# Patient Record
Sex: Female | Born: 1935
Health system: Southern US, Community
[De-identification: ages and names within clinical notes are randomized; demographics above are authoritative.]

## PROBLEM LIST (undated history)

## (undated) DIAGNOSIS — Z7901 Long term (current) use of anticoagulants: Secondary | ICD-10-CM

## (undated) DIAGNOSIS — M1990 Unspecified osteoarthritis, unspecified site: Secondary | ICD-10-CM

## (undated) DIAGNOSIS — Z8719 Personal history of other diseases of the digestive system: Secondary | ICD-10-CM

## (undated) DIAGNOSIS — Z973 Presence of spectacles and contact lenses: Secondary | ICD-10-CM

## (undated) DIAGNOSIS — R112 Nausea with vomiting, unspecified: Secondary | ICD-10-CM

## (undated) DIAGNOSIS — Z972 Presence of dental prosthetic device (complete) (partial): Secondary | ICD-10-CM

## (undated) DIAGNOSIS — Z9889 Other specified postprocedural states: Secondary | ICD-10-CM

## (undated) DIAGNOSIS — I4819 Other persistent atrial fibrillation: Secondary | ICD-10-CM

## (undated) DIAGNOSIS — D509 Iron deficiency anemia, unspecified: Secondary | ICD-10-CM

## (undated) DIAGNOSIS — R21 Rash and other nonspecific skin eruption: Secondary | ICD-10-CM

## (undated) DIAGNOSIS — N200 Calculus of kidney: Secondary | ICD-10-CM

## (undated) DIAGNOSIS — C189 Malignant neoplasm of colon, unspecified: Secondary | ICD-10-CM

## (undated) DIAGNOSIS — H9190 Unspecified hearing loss, unspecified ear: Secondary | ICD-10-CM

## (undated) DIAGNOSIS — K6389 Other specified diseases of intestine: Secondary | ICD-10-CM

## (undated) DIAGNOSIS — I10 Essential (primary) hypertension: Secondary | ICD-10-CM

## (undated) DIAGNOSIS — I480 Paroxysmal atrial fibrillation: Secondary | ICD-10-CM

## (undated) HISTORY — PX: CATARACT EXTRACTION W/ INTRAOCULAR LENS  IMPLANT, BILATERAL: SHX1307

## (undated) HISTORY — PX: COLONOSCOPY: SHX174

## (undated) HISTORY — DX: Essential (primary) hypertension: I10

## (undated) HISTORY — PX: BREAST CYST EXCISION: SHX579

## (undated) HISTORY — DX: Other persistent atrial fibrillation: I48.19

## (undated) HISTORY — DX: Malignant neoplasm of colon, unspecified: C18.9

## (undated) HISTORY — DX: Paroxysmal atrial fibrillation: I48.0

---

## 1976-12-20 HISTORY — PX: ABDOMINAL HYSTERECTOMY: SHX81

## 2013-11-07 ENCOUNTER — Other Ambulatory Visit: Payer: Self-pay | Admitting: Cardiology

## 2013-11-12 ENCOUNTER — Other Ambulatory Visit: Payer: Self-pay | Admitting: General Surgery

## 2013-11-12 MED ORDER — CLOPIDOGREL BISULFATE 75 MG PO TABS: 75.0000 mg | ORAL_TABLET | Freq: Once | ORAL | Status: DC

## 2013-11-12 NOTE — Telephone Encounter (Signed)
rx re sent for pt

## 2013-11-23 ENCOUNTER — Other Ambulatory Visit: Payer: Self-pay | Admitting: *Deleted

## 2013-11-23 MED ORDER — CLOPIDOGREL BISULFATE 75 MG PO TABS: 75.0000 mg | ORAL_TABLET | Freq: Once | ORAL | Status: DC

## 2014-02-19 ENCOUNTER — Encounter: Payer: Self-pay | Admitting: General Surgery

## 2014-02-19 DIAGNOSIS — I4891 Unspecified atrial fibrillation: Secondary | ICD-10-CM

## 2014-02-19 DIAGNOSIS — I10 Essential (primary) hypertension: Secondary | ICD-10-CM | POA: Insufficient documentation

## 2014-02-19 DIAGNOSIS — E7800 Pure hypercholesterolemia, unspecified: Secondary | ICD-10-CM | POA: Insufficient documentation

## 2014-02-20 ENCOUNTER — Encounter: Payer: Self-pay | Admitting: Cardiology

## 2014-02-20 ENCOUNTER — Encounter (INDEPENDENT_AMBULATORY_CARE_PROVIDER_SITE_OTHER): Payer: Self-pay

## 2014-02-20 ENCOUNTER — Ambulatory Visit (INDEPENDENT_AMBULATORY_CARE_PROVIDER_SITE_OTHER): Payer: Medicare Other | Admitting: Cardiology

## 2014-02-20 VITALS — BP 150/76 | HR 107 | Ht 62.0 in | Wt 99.0 lb

## 2014-02-20 DIAGNOSIS — I498 Other specified cardiac arrhythmias: Secondary | ICD-10-CM

## 2014-02-20 DIAGNOSIS — R011 Cardiac murmur, unspecified: Secondary | ICD-10-CM

## 2014-02-20 DIAGNOSIS — I480 Paroxysmal atrial fibrillation: Secondary | ICD-10-CM | POA: Insufficient documentation

## 2014-02-20 DIAGNOSIS — I4891 Unspecified atrial fibrillation: Secondary | ICD-10-CM

## 2014-02-20 DIAGNOSIS — I10 Essential (primary) hypertension: Secondary | ICD-10-CM

## 2014-02-20 DIAGNOSIS — R000 Tachycardia, unspecified: Secondary | ICD-10-CM | POA: Insufficient documentation

## 2014-02-20 NOTE — Progress Notes (Signed)
  [address], [place], [place]  [address] Phone: [phone] Fax:  [phone]  Date:  02/20/2014   ID:  Kristen Cruz, DOB 07/31/1936, MRN [id number]  PCP:  No primary provider on file.  Cardiologist:  Fransico Him, MD     History of Present Illness: Kristen Cruz is a 78 y.o. female with a history of PAF and HTN presents today for followup.  She is doing well.  She denies any chest pain, SOB, DOE, LE edema, dizziness, palpitations or syncope.  She walks 3 miles daily.   Wt Readings from Last 3 Encounters:  02/20/14 99 lb (44.906 kg)     Past Medical History  Diagnosis Date  . PAF (paroxysmal atrial fibrillation)     patient has refused coumadin in the past.   . Hypertension     Current Outpatient Prescriptions  Medication Sig Dispense Refill  . clopidogrel (PLAVIX) 75 MG tablet Take 1 tablet (75 mg total) by mouth once.  90 tablet  1  . diltiazem (DILACOR XR) 180 MG 24 hr capsule Take 180 mg by mouth daily.      . fluorometholone (FML) 0.1 % ophthalmic suspension       . Multiple Vitamin (MULTIVITAMIN) tablet Take 1 tablet by mouth daily.       No current facility-administered medications for this visit.    Allergies:    Allergies  Allergen Reactions  . Asa [Aspirin]   . Bentyl [Dicyclomine Hcl]   . Sucrets [Dyclonine]   . Sulfa Antibiotics   . Tetracycline Hcl   . Tylenol [Acetaminophen]     Social History:  The patient  reports that she has never smoked. She does not have any smokeless tobacco history on file. She reports that she does not drink alcohol or use illicit drugs.   Family History:  The patient's family history is not on file.   ROS:  Please see the history of present illness.      All other systems reviewed and negative.   PHYSICAL EXAM: VS:  BP 150/76  Pulse 107  Ht 5\' 2"  (1.575 m)  Wt 99 lb (44.906 kg)  BMI 18.10 kg/m2 Well nourished, well developed, in no acute distress HEENT: normal Neck: no JVD Cardiac:  normal  S1, S2; RRR; 2/6 SM at RUSB Lungs:  clear to auscultation bilaterally, no wheezing, rhonchi or rales Abd: soft, nontender, no hepatomegaly Ext: no edema Skin: warm and dry Neuro:  CNs 2-12 intact, no focal abnormalities noted  EKG: NSR with incomplete RBBB     ASSESSMENT AND PLAN:  1.  PAF maintaining NSR   - continue Diltiazem/Plavix 2.  HTN - slightly elevated but at home it runs around 115/80mmHg   - continue Diltiazem 3.  Incomplete RBBB 4.  Heart murmur   - 2D echo to evaluate 5.  Sinus tachycardia -  Her HR is mildly elevated today so I will get a 24 hour Holter to evaluate for average HR  Followup with me in 1 year  Signed, Fransico Him, MD 02/20/2014 9:46 AM

## 2014-02-20 NOTE — Patient Instructions (Signed)
Your physician recommends that you continue on your current medications as directed. Please refer to the Current Medication list given to you today.  Your physician has requested that you have an echocardiogram. Echocardiography is a painless test that uses sound waves to create images of your heart. It provides your doctor with information about the size and shape of your heart and how well your heart's chambers and valves are working. This procedure takes approximately one hour. There are no restrictions for this procedure.  Your physician has recommended that you wear a holter monitor. Holter monitors are medical devices that record the heart's electrical activity. Doctors most often use these monitors to diagnose arrhythmias. Arrhythmias are problems with the speed or rhythm of the heartbeat. The monitor is a small, portable device. You can wear one while you do your normal daily activities. This is usually used to diagnose what is causing palpitations/syncope (passing out).  Your physician wants you to follow-up in: 1 Year with Dr Mallie Snooks will receive a reminder letter in the mail two months in advance. If you don't receive a letter, please call our office to schedule the follow-up appointment.

## 2014-03-08 ENCOUNTER — Encounter (INDEPENDENT_AMBULATORY_CARE_PROVIDER_SITE_OTHER): Payer: Medicare Other

## 2014-03-08 ENCOUNTER — Encounter: Payer: Self-pay | Admitting: Radiology

## 2014-03-08 ENCOUNTER — Ambulatory Visit (HOSPITAL_COMMUNITY): Payer: Medicare Other | Attending: Cardiovascular Disease | Admitting: Radiology

## 2014-03-08 DIAGNOSIS — I498 Other specified cardiac arrhythmias: Secondary | ICD-10-CM

## 2014-03-08 DIAGNOSIS — R000 Tachycardia, unspecified: Secondary | ICD-10-CM

## 2014-03-08 DIAGNOSIS — R011 Cardiac murmur, unspecified: Secondary | ICD-10-CM

## 2014-03-08 NOTE — Progress Notes (Signed)
Echocardiogram performed.  

## 2014-03-08 NOTE — Progress Notes (Signed)
Patient ID: Kristen Cruz, female   DOB: March 30, 1936, 78 y.o.   MRN: [id number] Evo 24hr holter applied

## 2014-03-12 ENCOUNTER — Telehealth: Payer: Self-pay | Admitting: Cardiology

## 2014-03-12 NOTE — Telephone Encounter (Signed)
Please let patient know that heart monitor showed normal rhythm with occasional extra heart beats from the top and bottom of the heart which are benign.  Avg HR 68bpm.

## 2014-03-13 NOTE — Telephone Encounter (Signed)
Pt is aware.  

## 2014-04-02 ENCOUNTER — Other Ambulatory Visit: Payer: Self-pay | Admitting: *Deleted

## 2014-04-02 MED ORDER — DILTIAZEM HCL ER 180 MG PO CP24: 180.0000 mg | ORAL_CAPSULE | Freq: Every day | ORAL | Status: DC

## 2014-04-15 ENCOUNTER — Telehealth: Payer: Self-pay | Admitting: Cardiology

## 2014-04-15 NOTE — Telephone Encounter (Signed)
New message    patient calling from work     C/O Medication that she on having problem with it.  Diltiazem  180 mg . Heart skipping a beat .

## 2014-04-15 NOTE — Telephone Encounter (Signed)
To Dr Radford Pax to advise.

## 2014-04-16 NOTE — Telephone Encounter (Signed)
LVM for pt to return call

## 2014-04-16 NOTE — Telephone Encounter (Signed)
Pt stated that the Cardizem is with a different supplier now. She has had problems since she had started with the new supplier. Pt states she is feeling her heart skipping a beat. She was able to get the medication with the old [employer]. She is feeling better since switching back to the old pill. No complaints now. From now on she can only use the peach/purple pill, she will not take a green pill.   TO Dr Radford Pax as a Juluis Rainier

## 2014-04-16 NOTE — Telephone Encounter (Signed)
When did this start.  She has been on Cardizem for some time and not complained of problems with it

## 2014-04-29 ENCOUNTER — Other Ambulatory Visit: Payer: Self-pay | Admitting: Cardiology

## 2014-07-28 ENCOUNTER — Other Ambulatory Visit: Payer: Self-pay | Admitting: Cardiology

## 2014-12-17 ENCOUNTER — Encounter: Payer: Self-pay | Admitting: Cardiology

## 2015-01-02 ENCOUNTER — Other Ambulatory Visit: Payer: Self-pay | Admitting: Cardiology

## 2015-02-27 ENCOUNTER — Encounter: Payer: Self-pay | Admitting: Cardiology

## 2015-02-27 ENCOUNTER — Ambulatory Visit (INDEPENDENT_AMBULATORY_CARE_PROVIDER_SITE_OTHER): Payer: Medicare Other | Admitting: Cardiology

## 2015-02-27 VITALS — BP 144/78 | HR 107 | Ht 62.5 in | Wt 99.8 lb

## 2015-02-27 DIAGNOSIS — I480 Paroxysmal atrial fibrillation: Secondary | ICD-10-CM

## 2015-02-27 DIAGNOSIS — I450 Right fascicular block: Secondary | ICD-10-CM

## 2015-02-27 DIAGNOSIS — I4510 Unspecified right bundle-branch block: Secondary | ICD-10-CM

## 2015-02-27 DIAGNOSIS — I10 Essential (primary) hypertension: Secondary | ICD-10-CM

## 2015-02-27 NOTE — Patient Instructions (Signed)
Your physician recommends that you continue on your current medications as directed. Please refer to the Current Medication list given to you today.  Your physician wants you to follow-up in: 1 year with Dr. Turner. You will receive a reminder letter in the mail two months in advance. If you don't receive a letter, please call our office to schedule the follow-up appointment.  

## 2015-02-27 NOTE — Progress Notes (Signed)
Cardiology Office Note   Date:  02/27/2015   ID:  Kristen Cruz, DOB 10/17/1936, MRN [id number]  PCP:  No primary care provider on file.  Cardiologist:   Sueanne Margarita, MD   Chief Complaint  Patient presents with  . Atrial Fibrillation  . Hypertension      History of Present Illness: Kristen Cruz is a 79 y.o. female with a history of PAF and HTN presents today for followup. She is doing well. She denies any chest pain, SOB, DOE, LE edema, dizziness, palpitations or syncope. She walks 3 miles daily.    Past Medical History  Diagnosis Date  . PAF (paroxysmal atrial fibrillation)     patient has refused coumadin in the past.   . Hypertension     History reviewed. No pertinent past surgical history.   Current Outpatient Prescriptions  Medication Sig Dispense Refill  . clopidogrel (PLAVIX) 75 MG tablet TAKE 1 TABLET DAILY OR AS DIRECTED 90 tablet 0  . diltiazem (DILACOR XR) 180 MG 24 hr capsule Take 1 capsule (180 mg total) by mouth daily. 90 capsule 3  . fluorometholone (FML) 0.1 % ophthalmic suspension     . Multiple Vitamin (MULTIVITAMIN) tablet Take 1 tablet by mouth daily.     No current facility-administered medications for this visit.    Allergies:   Asa; Bentyl; Sucrets; Sulfa antibiotics; Tetracycline hcl; and Tylenol    Social History:  The patient  reports that she has never smoked. She does not have any smokeless tobacco history on file. She reports that she does not drink alcohol or use illicit drugs.   Family History:  The patient's family history includes Heart Problems in her mother; Hypertension in her mother.    ROS:  Please see the history of present illness.   Otherwise, review of systems are positive for none.   All other systems are reviewed and negative.    PHYSICAL EXAM: VS:  BP 144/78 mmHg  Pulse 107  Ht 5' 2.5" (1.588 m)  Wt 99 lb 12.8 oz (45.269 kg)  BMI 17.95 kg/m2 , BMI Body mass index is 17.95 kg/(m^2). GEN: Well  nourished, well developed, in no acute distress HEENT: normal Neck: no JVD, carotid bruits, or masses Cardiac: RRR; no murmurs, rubs, or gallops,no edema  Respiratory:  clear to auscultation bilaterally, normal work of breathing GI: soft, nontender, nondistended, + BS MS: no deformity or atrophy Skin: warm and dry, no rash Neuro:  Strength and sensation are intact Psych: euthymic mood, full affect   EKG:  EKG was ordered today showed NSR with biatrial enlargement and IRBBB    Recent Labs: No results found for requested labs within last 365 days.    Lipid Panel No results found for: CHOL, TRIG, HDL, CHOLHDL, VLDL, LDLCALC, LDLDIRECT    Wt Readings from Last 3 Encounters:  02/27/15 99 lb 12.8 oz (45.269 kg)  02/20/14 99 lb (44.906 kg)      ASSESSMENT AND PLAN:  1. PAF maintaining NSR - continue Diltiazem/Plavix 2. HTN - controlled - continue Diltiazem 3. Incomplete RBBB  Current medicines are reviewed at length with the patient today.  The patient does not have concerns regarding medicines.  The following changes have been made:  no change  Labs/ tests ordered today include: None  No orders of the defined types were placed in this encounter.     Disposition:   FU with me in 1 year   Signed, Sueanne Margarita, MD  02/27/2015 9:13  AM    [facility] [place], [place], [place]  [address] Phone: [phone]; Fax: [phone]

## 2015-04-02 ENCOUNTER — Other Ambulatory Visit: Payer: Self-pay | Admitting: Cardiology

## 2015-04-07 ENCOUNTER — Other Ambulatory Visit: Payer: Self-pay

## 2015-04-07 MED ORDER — DILTIAZEM HCL ER 180 MG PO CP24: 180.0000 mg | ORAL_CAPSULE | Freq: Every day | ORAL | Status: DC

## 2015-12-17 ENCOUNTER — Encounter: Payer: Self-pay | Admitting: *Deleted

## 2016-03-21 NOTE — Progress Notes (Signed)
Cardiology Office Note   Date:  03/22/2016   ID:  Kristen Cruz, DOB Sep 12, 1936, MRN [id number]  PCP:  No PCP Per Patient    Chief Complaint  Patient presents with  . Atrial Fibrillation    no sx  . Hypertension      History of Present Illness: Kristen Cruz is a 80 y.o. female with a history of PAF and HTN presents today for followup. She is doing fairly well.  She is just getting over a cold.   She denies any chest pain, SOB, DOE, LE edema, dizziness, palpitations or syncope. She walks 2 miles daily.    Past Medical History  Diagnosis Date  . PAF (paroxysmal atrial fibrillation) ([place])     patient has refused coumadin in the past.   . Hypertension     History reviewed. No pertinent past surgical history.   Current Outpatient Prescriptions  Medication Sig Dispense Refill  . clopidogrel (PLAVIX) 75 MG tablet Take 75 mg by mouth daily.    Marland Kitchen diltiazem (DILACOR XR) 180 MG 24 hr capsule Take 1 capsule (180 mg total) by mouth daily. 90 capsule 3  . fluorometholone (FML) 0.1 % ophthalmic suspension Place 1 drop into both eyes daily as needed (for dry eyes).     . Multiple Vitamin (MULTIVITAMIN) tablet Take 1 tablet by mouth daily.     No current facility-administered medications for this visit.    Allergies:   Asa; Bentyl; Sucrets; Sulfa antibiotics; Tetracycline hcl; and Tylenol    Social History:  The patient  reports that she has never smoked. She does not have any smokeless tobacco history on file. She reports that she does not drink alcohol or use illicit drugs.   Family History:  The patient's family history includes Heart Problems in her mother; Hypertension in her mother.    ROS:  Please see the history of present illness.   Otherwise, review of systems are positive for none.   All other systems are reviewed and negative.    PHYSICAL EXAM: VS:  BP 170/60 mmHg  Pulse 111  Ht 5' 2.5" (1.588 m)  Wt 101 lb (45.813 kg)  BMI 18.17  kg/m2  SpO2 96% , BMI Body mass index is 18.17 kg/(m^2). GEN: Well nourished, well developed, in no acute distress HEENT: normal Neck: no JVD, carotid bruits, or masses Cardiac: RRR; no murmurs, rubs, or gallops,no edema  Respiratory:  clear to auscultation bilaterally, normal work of breathing GI: soft, nontender, nondistended, + BS MS: no deformity or atrophy Skin: warm and dry, no rash Neuro:  Strength and sensation are intact Psych: euthymic mood, full affect   EKG:  EKG is ordered today. The ekg ordered today demonstrates sinus tachycardia at 109bpm with IRBBB   Recent Labs: No results found for requested labs within last 365 days.    Lipid Panel No results found for: CHOL, TRIG, HDL, CHOLHDL, VLDL, LDLCALC, LDLDIRECT    Wt Readings from Last 3 Encounters:  03/22/16 101 lb (45.813 kg)  02/27/15 99 lb 12.8 oz (45.269 kg)  02/20/14 99 lb (44.906 kg)      Other studies Reviewed:  ASSESSMENT AND PLAN:  1. PAF maintaining NSR.  Her CHADS2VASC score is 4.  She has refused coumadin in the past but I discussed the NOAC agents with her today and she is willing to go on Eiliquis.  I will start at 5mg   BID.  She will stop her Plavix.   - continue Diltiazem 2. HTN - poorly controlled - she says that she has white coat HTN.  Her BP at home this am was 137/74mmHg.   - continue Diltiazem 3. Incomplete RBBB 4.  Sinus tachycardia - she gets very anxious coming to the doctor.  At home this am it was 70bpm.    Current medicines are reviewed at length with the patient today.  The patient does not have concerns regarding medicines.  The following changes have been made:  no change  Labs/ tests ordered today: See above Assessment and Plan No orders of the defined types were placed in this encounter.     Disposition:   FU with me in 1 year  Signed, Sueanne Margarita, MD  03/22/2016 9:41 AM    [facility] [place],  [place], [place]  [address] Phone: [phone]; Fax: [phone]

## 2016-03-22 ENCOUNTER — Ambulatory Visit (INDEPENDENT_AMBULATORY_CARE_PROVIDER_SITE_OTHER): Payer: Medicare Other | Admitting: Cardiology

## 2016-03-22 ENCOUNTER — Encounter: Payer: Self-pay | Admitting: Cardiology

## 2016-03-22 VITALS — BP 170/60 | HR 111 | Ht 62.5 in | Wt 101.0 lb

## 2016-03-22 DIAGNOSIS — I450 Right fascicular block: Secondary | ICD-10-CM

## 2016-03-22 DIAGNOSIS — I10 Essential (primary) hypertension: Secondary | ICD-10-CM | POA: Diagnosis not present

## 2016-03-22 DIAGNOSIS — I480 Paroxysmal atrial fibrillation: Secondary | ICD-10-CM | POA: Diagnosis not present

## 2016-03-22 DIAGNOSIS — I4510 Unspecified right bundle-branch block: Secondary | ICD-10-CM

## 2016-03-22 DIAGNOSIS — R000 Tachycardia, unspecified: Secondary | ICD-10-CM

## 2016-03-22 LAB — CBC WITH DIFFERENTIAL/PLATELET
Basophils Absolute: 0 cells/uL (ref 0–200)
Basophils Relative: 0 %
Eosinophils Absolute: 0 cells/uL — ABNORMAL LOW (ref 15–500)
Eosinophils Relative: 0 %
HCT: 36.7 % (ref 35.0–45.0)
Hemoglobin: 11.9 g/dL (ref 11.7–15.5)
Lymphocytes Relative: 18 %
Lymphs Abs: 1080 cells/uL (ref 850–3900)
MCH: 28.6 pg (ref 27.0–33.0)
MCHC: 32.4 g/dL (ref 32.0–36.0)
MCV: 88.2 fL (ref 80.0–100.0)
MPV: 10.9 fL (ref 7.5–12.5)
Monocytes Absolute: 480 cells/uL (ref 200–950)
Monocytes Relative: 8 %
Neutro Abs: 4440 cells/uL (ref 1500–7800)
Neutrophils Relative %: 74 %
Platelets: 295 10*3/uL (ref 140–400)
RBC: 4.16 MIL/uL (ref 3.80–5.10)
RDW: 14.3 % (ref 11.0–15.0)
WBC: 6 10*3/uL (ref 3.8–10.8)

## 2016-03-22 LAB — BASIC METABOLIC PANEL
BUN: 12 mg/dL (ref 7–25)
CO2: 26 mmol/L (ref 20–31)
Calcium: 9 mg/dL (ref 8.6–10.4)
Chloride: 103 mmol/L (ref 98–110)
Creat: 0.67 mg/dL (ref 0.60–0.93)
Glucose, Bld: 113 mg/dL — ABNORMAL HIGH (ref 65–99)
Potassium: 4.1 mmol/L (ref 3.5–5.3)
Sodium: 139 mmol/L (ref 135–146)

## 2016-03-22 MED ORDER — APIXABAN 5 MG PO TABS: 5.0000 mg | ORAL_TABLET | Freq: Two times a day (BID) | ORAL | Status: DC

## 2016-03-22 NOTE — Patient Instructions (Signed)
Medication Instructions:  Your physician has recommended you make the following change in your medication:  1) STOP PLAVIX 2) START ELIQUIS 5 mg TWICE DAILY  Labwork: TODAY: BMET, CBC  Testing/Procedures: None  Follow-Up: Your physician wants you to follow-up in: 6 months with Dr. Radford Pax. You will receive a reminder letter in the mail two months in advance. If you don't receive a letter, please call our office to schedule the follow-up appointment.   Any Other Special Instructions Will Be Listed Below (If Applicable).     If you need a refill on your cardiac medications before your next appointment, please call your pharmacy.

## 2016-03-23 ENCOUNTER — Telehealth: Payer: Self-pay | Admitting: Cardiology

## 2016-03-23 NOTE — Telephone Encounter (Signed)
Informed patient of results and verbal understanding expressed.  

## 2016-03-23 NOTE — Telephone Encounter (Signed)
NEW MESSAGE   Pt is returning call for rn   Pt wants results for blood work

## 2016-03-23 NOTE — Telephone Encounter (Signed)
-----   Message from Sueanne Margarita, MD sent at 03/22/2016  4:08 PM EDT ----- Please let patient know that labs were normal.  Continue current medical therapy.

## 2016-03-25 ENCOUNTER — Telehealth: Payer: Self-pay | Admitting: Cardiology

## 2016-03-25 NOTE — Telephone Encounter (Signed)
Patient st her insurance has not approved her Eliquis.  To PA RN for appropriate paperwork.

## 2016-03-26 ENCOUNTER — Telehealth: Payer: Self-pay

## 2016-03-26 NOTE — Telephone Encounter (Signed)
Prior auth for Eliquis 5 mg approved by [facility] through 04.06/2017. Case ID# [id number]. Pharmacy notified.

## 2016-03-26 NOTE — Telephone Encounter (Signed)
Patient notified, Eliquis approved.

## 2016-03-29 ENCOUNTER — Other Ambulatory Visit: Payer: Self-pay

## 2016-03-29 MED ORDER — DILTIAZEM HCL ER 180 MG PO CP24: 180.0000 mg | ORAL_CAPSULE | Freq: Every day | ORAL | Status: DC

## 2016-09-14 ENCOUNTER — Encounter (INDEPENDENT_AMBULATORY_CARE_PROVIDER_SITE_OTHER): Payer: Self-pay

## 2016-09-14 ENCOUNTER — Ambulatory Visit (INDEPENDENT_AMBULATORY_CARE_PROVIDER_SITE_OTHER): Payer: Medicare Other | Admitting: Cardiology

## 2016-09-14 ENCOUNTER — Encounter: Payer: Self-pay | Admitting: Cardiology

## 2016-09-14 VITALS — BP 152/74 | HR 94 | Ht 62.5 in | Wt 100.8 lb

## 2016-09-14 DIAGNOSIS — I480 Paroxysmal atrial fibrillation: Secondary | ICD-10-CM

## 2016-09-14 DIAGNOSIS — I10 Essential (primary) hypertension: Secondary | ICD-10-CM

## 2016-09-14 LAB — BASIC METABOLIC PANEL
BUN: 14 mg/dL (ref 7–25)
CO2: 26 mmol/L (ref 20–31)
Calcium: 9 mg/dL (ref 8.6–10.4)
Chloride: 104 mmol/L (ref 98–110)
Creat: 0.78 mg/dL (ref 0.60–0.93)
Glucose, Bld: 124 mg/dL — ABNORMAL HIGH (ref 65–99)
Potassium: 4.3 mmol/L (ref 3.5–5.3)
Sodium: 138 mmol/L (ref 135–146)

## 2016-09-14 LAB — CBC WITH DIFFERENTIAL/PLATELET
Basophils Absolute: 65 cells/uL (ref 0–200)
Basophils Relative: 1 %
Eosinophils Absolute: 0 cells/uL — ABNORMAL LOW (ref 15–500)
Eosinophils Relative: 0 %
HCT: 33.8 % — ABNORMAL LOW (ref 35.0–45.0)
Hemoglobin: 11.2 g/dL — ABNORMAL LOW (ref 11.7–15.5)
Lymphocytes Relative: 22 %
Lymphs Abs: 1430 cells/uL (ref 850–3900)
MCH: 29.2 pg (ref 27.0–33.0)
MCHC: 33.1 g/dL (ref 32.0–36.0)
MCV: 88 fL (ref 80.0–100.0)
MPV: 10.7 fL (ref 7.5–12.5)
Monocytes Absolute: 390 cells/uL (ref 200–950)
Monocytes Relative: 6 %
Neutro Abs: 4615 cells/uL (ref 1500–7800)
Neutrophils Relative %: 71 %
Platelets: 308 10*3/uL (ref 140–400)
RBC: 3.84 MIL/uL (ref 3.80–5.10)
RDW: 13.7 % (ref 11.0–15.0)
WBC: 6.5 10*3/uL (ref 3.8–10.8)

## 2016-09-14 MED ORDER — DILTIAZEM HCL ER 180 MG PO CP24: 180.0000 mg | ORAL_CAPSULE | Freq: Every day | ORAL | 3 refills | Status: DC

## 2016-09-14 MED ORDER — APIXABAN 5 MG PO TABS: 5.0000 mg | ORAL_TABLET | Freq: Two times a day (BID) | ORAL | 11 refills | Status: DC

## 2016-09-14 NOTE — Patient Instructions (Signed)

## 2016-09-14 NOTE — Progress Notes (Signed)
Cardiology Office Note    Date:  09/14/2016   ID:  Kristen Cruz, DOB 1936/08/08, MRN [id number]  PCP:  No PCP Per Patient  Cardiologist:  Fransico Him, MD   Chief Complaint  Patient presents with  . Atrial Fibrillation  . Hypertension    History of Present Illness:  Kristen Cruz is a 80 y.o. female with a history of PAF and HTN presents today for followup. She is doing fairly well.   She denies any chest pain, SOB, DOE, LE edema, dizziness or syncope. She occasionally will have some palpitations but this is usually if she gets emotionally upset.  She continues to walks 2 miles daily.     Past Medical History:  Diagnosis Date  . Hypertension   . PAF (paroxysmal atrial fibrillation) (HCC)    CHADS2VASC score is 4 on Apixaban    History reviewed. No pertinent surgical history.  Current Medications: Outpatient Medications Prior to Visit  Medication Sig Dispense Refill  . apixaban (ELIQUIS) 5 MG TABS tablet Take 1 tablet (5 mg total) by mouth 2 (two) times daily. 60 tablet 11  . diltiazem (DILACOR XR) 180 MG 24 hr capsule Take 1 capsule (180 mg total) by mouth daily. 90 capsule 3  . Multiple Vitamin (MULTIVITAMIN) tablet Take 1 tablet by mouth daily.    . fluorometholone (FML) 0.1 % ophthalmic suspension Place 1 drop into both eyes daily as needed (for dry eyes).      No facility-administered medications prior to visit.      Allergies:   Asa [aspirin]; Bentyl [dicyclomine hcl]; Sucrets [dyclonine]; Sulfa antibiotics; Tetracycline hcl; and Tylenol [acetaminophen]   Social History   Social History  . Marital status: Married    Spouse name: N/A  . Number of children: N/A  . Years of education: N/A   Social History Main Topics  . Smoking status: Never Smoker  . Smokeless tobacco: Never Used  . Alcohol use No  . Drug use: No  . Sexual activity: Not Asked   Other Topics Concern  . None   Social History Narrative  . None     Family History:  The  patient's family history includes Heart Problems in her mother; Hypertension in her mother.   ROS:   Please see the history of present illness.    ROS All other systems reviewed and are negative.  No flowsheet data found.     PHYSICAL EXAM:   VS:  BP (!) 152/74   Pulse 94   Ht 5' 2.5" (1.588 m)   Wt 100 lb 12.8 oz (45.7 kg)   SpO2 97%   BMI 18.14 kg/m    GEN: Well nourished, well developed, in no acute distress  HEENT: normal  Neck: no JVD, carotid bruits, or masses Cardiac: RRR; no murmurs, rubs, or gallops,no edema.  Intact distal pulses bilaterally.  Respiratory:  clear to auscultation bilaterally, normal work of breathing GI: soft, nontender, nondistended, + BS MS: no deformity or atrophy  Skin: warm and dry, no rash Neuro:  Alert and Oriented x 3, Strength and sensation are intact Psych: euthymic mood, full affect  Wt Readings from Last 3 Encounters:  09/14/16 100 lb 12.8 oz (45.7 kg)  03/22/16 101 lb (45.8 kg)  02/27/15 99 lb 12.8 oz (45.3 kg)      Studies/Labs Reviewed:   EKG:  EKG is not ordered today.    Recent Labs: 03/22/2016: BUN 12; Creat 0.67; Hemoglobin 11.9; Platelets 295; Potassium 4.1; Sodium 139  Lipid Panel No results found for: CHOL, TRIG, HDL, CHOLHDL, VLDL, LDLCALC, LDLDIRECT  Additional studies/ records that were reviewed today include:  none    ASSESSMENT:    1. PAF (paroxysmal atrial fibrillation) ([place])   2. Essential hypertension, benign      PLAN:  In order of problems listed above:  1. PAF maintaining NSR.  Continue Apixiban and Cardizem. Check BMET and CBC. 2. HTN - Bp borderline controlled in office today.  She says at home it was 127/14mmHg this am.  She will continue on Cardizem.      Medication Adjustments/Labs and Tests Ordered: Current medicines are reviewed at length with the patient today.  Concerns regarding medicines are outlined above.  Medication changes, Labs and Tests ordered today are listed in the Patient  Instructions below.  There are no Patient Instructions on file for this visit.   Signed, Fransico Him, MD  09/14/2016 9:40 AM    [facility], [place], [place]  [address] Phone: [phone]; Fax: [phone]

## 2017-03-13 NOTE — Progress Notes (Signed)
Cardiology Office Note    Date:  03/14/2017   ID:  Kristen Cruz, DOB 1936-10-24, MRN [id number]  PCP:  No PCP Per Patient  Cardiologist:  Fransico Him, MD   Chief Complaint  Patient presents with  . Atrial Fibrillation  . Hypertension    History of Present Illness:  Kristen Cruz is a 81 y.o. female with a history of persistent atrial fibrillation and HTN, who presents today for followup. She has been doing well since I saw her last.She denies any chest pain, SOB, DOE, LE edema, PND, orthopnea, dizziness or syncope. She continues to walks 2 miles daily.   Past Medical History:  Diagnosis Date  . Hypertension   . Persistent atrial fibrillation (HCC)    CHADS2VASC score is 4 on Apixaban    No past surgical history on file.  Current Medications: Current Meds  Medication Sig  . apixaban (ELIQUIS) 5 MG TABS tablet Take 1 tablet (5 mg total) by mouth 2 (two) times daily.  Marland Kitchen diltiazem (DILACOR XR) 180 MG 24 hr capsule Take 1 capsule (180 mg total) by mouth daily.  . Multiple Vitamin (MULTIVITAMIN) tablet Take 1 tablet by mouth daily.  . Polyvinyl Alcohol-Povidone (REFRESH OP) Apply to eye as directed.    Allergies:   Asa [aspirin]; Bentyl [dicyclomine hcl]; Sucrets [dyclonine]; Sulfa antibiotics; Tetracycline hcl; and Tylenol [acetaminophen]   Social History   Social History  . Marital status: Married    Spouse name: N/A  . Number of children: N/A  . Years of education: N/A   Social History Main Topics  . Smoking status: Never Smoker  . Smokeless tobacco: Never Used  . Alcohol use No  . Drug use: No  . Sexual activity: Not Asked   Other Topics Concern  . None   Social History Narrative  . None     Family History:  The patient's  family history includes Heart Problems in her mother; Hypertension in her mother.   ROS:   Please see the history of present illness.    ROS All other systems reviewed and are negative.  No flowsheet data  found.     PHYSICAL EXAM:   VS:  BP (!) 160/80   Pulse 94   Ht 5\' 2"  (1.575 m)   Wt 100 lb (45.4 kg)   BMI 18.29 kg/m    GEN: Well nourished, well developed, in no acute distress  HEENT: normal  Neck: no JVD, carotid bruits, or masses Cardiac: RRR; no murmurs, rubs, or gallops,no edema.  Intact distal pulses bilaterally.  Respiratory:  clear to auscultation bilaterally, normal work of breathing GI: soft, nontender, nondistended, + BS MS: no deformity or atrophy  Skin: warm and dry, no rash Neuro:  Alert and Oriented x 3, Strength and sensation are intact Psych: euthymic mood, full affect  Wt Readings from Last 3 Encounters:  03/14/17 100 lb (45.4 kg)  09/14/16 100 lb 12.8 oz (45.7 kg)  03/22/16 101 lb (45.8 kg)      Studies/Labs Reviewed:   EKG:  EKG is not ordered today.    Recent Labs: 09/14/2016: BUN 14; Creat 0.78; Hemoglobin 11.2; Platelets 308; Potassium 4.3; Sodium 138   Lipid Panel No results found for: CHOL, TRIG, HDL, CHOLHDL, VLDL, LDLCALC, LDLDIRECT  Additional studies/ records that were reviewed today include:  none    ASSESSMENT:    1. Persistent atrial fibrillation ([place])   2. Essential hypertension, benign      PLAN:  In order of  problems listed above:  1. Persistent atrial fibrillation - she is maintaining NSR without any breakthrough.  She will continue on Cardizem and Apixaban. Her CADS2VASC score is 4.   I will check a BMET and CBC    2. HTN - BP borderline controlled today but at home her SBP is in the 100-110's.  She will continue on Cardizem.    Medication Adjustments/Labs and Tests Ordered: Current medicines are reviewed at length with the patient today.  Concerns regarding medicines are outlined above.  Medication changes, Labs and Tests ordered today are listed in the Patient Instructions below.  There are no Patient Instructions on file for this visit.   Signed, Fransico Him, MD  03/14/2017 9:20 AM    [facility] [place], [place], [place]  [address] Phone: [phone]; Fax: [phone]

## 2017-03-14 ENCOUNTER — Encounter: Payer: Self-pay | Admitting: Cardiology

## 2017-03-14 ENCOUNTER — Ambulatory Visit (INDEPENDENT_AMBULATORY_CARE_PROVIDER_SITE_OTHER): Payer: Medicare Other | Admitting: Cardiology

## 2017-03-14 VITALS — BP 160/80 | HR 94 | Ht 62.0 in | Wt 100.0 lb

## 2017-03-14 DIAGNOSIS — I481 Persistent atrial fibrillation: Secondary | ICD-10-CM | POA: Diagnosis not present

## 2017-03-14 DIAGNOSIS — I10 Essential (primary) hypertension: Secondary | ICD-10-CM

## 2017-03-14 DIAGNOSIS — I4819 Other persistent atrial fibrillation: Secondary | ICD-10-CM

## 2017-03-14 NOTE — Patient Instructions (Signed)
Medication Instructions:  Your physician recommends that you continue on your current medications as directed. Please refer to the Current Medication list given to you today.   Labwork: TODAY: BMET, CBC  Testing/Procedures: None  Follow-Up: Your physician wants you to follow-up in: 6 months with Dr. Theodosia Blender assistant. You will receive a reminder letter in the mail two months in advance. If you don't receive a letter, please call our office to schedule the follow-up appointment.   Your physician wants you to follow-up in: 1 year with Dr. Radford Pax. You will receive a reminder letter in the mail two months in advance. If you don't receive a letter, please call our office to schedule the follow-up appointment.   Any Other Special Instructions Will Be Listed Below (If Applicable).     If you need a refill on your cardiac medications before your next appointment, please call your pharmacy.

## 2017-03-15 ENCOUNTER — Telehealth: Payer: Self-pay

## 2017-03-15 LAB — CBC WITH DIFFERENTIAL/PLATELET
Basophils Absolute: 0 10*3/uL (ref 0.0–0.2)
Basos: 0 %
EOS (ABSOLUTE): 0 10*3/uL (ref 0.0–0.4)
Eos: 0 %
Hematocrit: 34.1 % (ref 34.0–46.6)
Hemoglobin: 10.9 g/dL — ABNORMAL LOW (ref 11.1–15.9)
Immature Grans (Abs): 0 10*3/uL (ref 0.0–0.1)
Immature Granulocytes: 0 %
Lymphocytes Absolute: 1 10*3/uL (ref 0.7–3.1)
Lymphs: 15 %
MCH: 27.7 pg (ref 26.6–33.0)
MCHC: 32 g/dL (ref 31.5–35.7)
MCV: 87 fL (ref 79–97)
Monocytes Absolute: 0.5 10*3/uL (ref 0.1–0.9)
Monocytes: 8 %
Neutrophils Absolute: 4.8 10*3/uL (ref 1.4–7.0)
Neutrophils: 77 %
Platelets: 315 10*3/uL (ref 150–379)
RBC: 3.94 x10E6/uL (ref 3.77–5.28)
RDW: 14.3 % (ref 12.3–15.4)
WBC: 6.3 10*3/uL (ref 3.4–10.8)

## 2017-03-15 LAB — BASIC METABOLIC PANEL
BUN/Creatinine Ratio: 17 (ref 12–28)
BUN: 12 mg/dL (ref 8–27)
CO2: 26 mmol/L (ref 18–29)
Calcium: 9 mg/dL (ref 8.7–10.3)
Chloride: 101 mmol/L (ref 96–106)
Creatinine, Ser: 0.7 mg/dL (ref 0.57–1.00)
GFR calc Af Amer: 96 mL/min/{1.73_m2} (ref >59)
GFR calc non Af Amer: 83 mL/min/{1.73_m2} (ref >59)
Glucose: 123 mg/dL — ABNORMAL HIGH (ref 65–99)
Potassium: 4.5 mmol/L (ref 3.5–5.2)
Sodium: 141 mmol/L (ref 134–144)

## 2017-03-15 NOTE — Telephone Encounter (Signed)
Prior auth for Eliquis 5 mg submitted to [facility].

## 2017-03-17 ENCOUNTER — Telehealth: Payer: Self-pay | Admitting: Cardiology

## 2017-03-17 NOTE — Telephone Encounter (Signed)
New Message     Please call pt calling re lab results

## 2017-03-17 NOTE — Telephone Encounter (Signed)
Lab results provided.  Copy mailed to her home per request.  Address verified.

## 2017-03-30 NOTE — Telephone Encounter (Signed)
We received a form back from [facility]. Form questions were answered and Dr Johnsie Cancel signed. Form faxed back to [facility].

## 2017-03-30 NOTE — Telephone Encounter (Signed)
Form has been placed in Dr Landis Gandy folder to be signed. Will fax to [facility] when ready.

## 2017-04-04 NOTE — Telephone Encounter (Signed)
**Note De-Identified Aubriella Perezgarcia Obfuscation** PA on Eliquis has been approved through [employer]. Approval good from 03/04/17 until 04/03/18.

## 2017-06-20 ENCOUNTER — Other Ambulatory Visit: Payer: Self-pay | Admitting: Cardiology

## 2017-08-10 ENCOUNTER — Telehealth: Payer: Self-pay | Admitting: Cardiology

## 2017-08-10 ENCOUNTER — Other Ambulatory Visit: Payer: Self-pay

## 2017-08-10 NOTE — Telephone Encounter (Signed)
Patient called to report she was notified by her pharmacy today her diltiazem is no longer being made by her manufacturer. She is worried about switching manufacturers because last time that happened (see 04/15/2014 phone note) her "heart starting going crazy jumping around."  She is used to taking pink/purple pills and is nervous to try the same medication from a different maker, since the color was the only difference last time. She has 3 pills left. She requests medication recommendations from Dr. Radford Pax.

## 2017-08-10 NOTE — Telephone Encounter (Signed)
Mrs.Hudock is calling because her Diltiazem she is taking is being discontinue and the pharmacy is supposed to call to let you know .  Please call

## 2017-08-10 NOTE — Telephone Encounter (Signed)
Follow up      Pt missed your call , please call

## 2017-08-10 NOTE — Telephone Encounter (Signed)
Please forward to Rutherford Limerick PharmD for recommendations

## 2017-08-10 NOTE — Telephone Encounter (Signed)
Left message to call back  

## 2017-08-11 NOTE — Telephone Encounter (Signed)
Spoke with patient's RPH, Verdis Frederickson. She confirmed the patient will only take [employer] brand diltiazem. It is unknown the brand that made her ill in 2015 because records don't go back that far, but the only green dilt at the store is [employer] brand. The only other brand of diltiazem 180 mg is Diltiazem XR and is orange. [employer] brand will only carry dilt 240 mg capsules.  Verdis Frederickson states if the patient is unwilling to try the orange capsules, her medication will have to be changed completely.   Called patient to discuss trying dilt XR (an orange medication).  Left message to call back.

## 2017-08-11 NOTE — Telephone Encounter (Signed)
Pt will need to switch to a different manufacturer if hers is no longer supplying diltiazem 180mg  extended release. Would advise pt to check with her retail pharmacy to see if they carry multiple other manufacturers so that she can avoid the previous alternative manufacturer she tried and had issues with.

## 2017-08-12 MED ORDER — DILTIAZEM HCL ER 180 MG PO CP24: 180.0000 mg | ORAL_CAPSULE | Freq: Every day | ORAL | 11 refills | Status: DC

## 2017-08-12 NOTE — Telephone Encounter (Signed)
Called patient back and told her that a prescription was sent in requesting the orange pill. Informed patient that a 30 day supply was sent in, just in case she had any issues with this pill color. Patient verbalized understanding.

## 2017-08-12 NOTE — Telephone Encounter (Signed)
Follow up ° ° ° ° ° °Returning a call to the nurse °

## 2017-09-07 NOTE — Progress Notes (Signed)
Cardiology Office Note    Date:  09/12/2017   ID:  Kristen Cruz, DOB 03/09/1936, MRN [id number]  PCP:  Patient, No Pcp Per  Cardiologist: Dr. Radford Pax  Chief Complaint  Patient presents with  . Follow-up    History of Present Illness:  Kristen Cruz is a 81 y.o. female with history of persistent atrial fibrillation,CHADSVASC=4 on Eliquis, hypertension. Last saw Dr. Radford Pax 02/2017 and doing well.  Patient comes in today for follow-up. She is under a lot of stress concern husband is undergoing chemotherapy for newly diagnosed cancer. She didn't take her medications before she came in her blood pressure and pulse are up a little. She says she always gets nervous before she comes. Her blood pressures run around 115/70 at home. She exercises daily and continues to work in Programmer, applications at [employer].    Past Medical History:  Diagnosis Date  . Hypertension   . Persistent atrial fibrillation (HCC)    CHADS2VASC score is 4 on Apixaban    History reviewed. No pertinent surgical history.  Current Medications: Current Meds  Medication Sig  . apixaban (ELIQUIS) 5 MG TABS tablet Take 1 tablet (5 mg total) by mouth 2 (two) times daily.  Marland Kitchen diltiazem (DILACOR XR) 180 MG 24 hr capsule Take 1 capsule (180 mg total) by mouth daily.  . Polyvinyl Alcohol-Povidone (REFRESH OP) Apply 2 drops to eye at bedtime.      Allergies:   Asa [aspirin]; Bentyl [dicyclomine hcl]; Sucrets [dyclonine]; Sulfa antibiotics; Tetracycline hcl; and Tylenol [acetaminophen]   Social History   Social History  . Marital status: Married    Spouse name: N/A  . Number of children: N/A  . Years of education: N/A   Social History Main Topics  . Smoking status: Never Smoker  . Smokeless tobacco: Never Used  . Alcohol use No  . Drug use: No  . Sexual activity: Not Asked   Other Topics Concern  . None   Social History Narrative  . None     Family History:  The patient's family history includes  Heart Problems in her mother; Hypertension in her mother.   ROS:   Please see the history of present illness.    Review of Systems  Constitution: Negative.  HENT: Negative.   Eyes: Negative.   Cardiovascular: Negative.   Respiratory: Negative.   Hematologic/Lymphatic: Negative.   Musculoskeletal: Negative.  Negative for joint pain.  Gastrointestinal: Negative.   Genitourinary: Negative.   Neurological: Negative.   Psychiatric/Behavioral: The patient is nervous/anxious.    All other systems reviewed and are negative.   PHYSICAL EXAM:   VS:  BP (!) 160/58   Pulse 92   Ht 5' 2.5" (1.588 m)   Wt 100 lb 6.4 oz (45.5 kg)   BMI 18.07 kg/m   Physical Exam  GEN: Well nourished, well developed, in no acute distress  Neck: no JVD, carotid bruits, or masses Cardiac:RRR; Midsystolic click at the apex  Respiratory:  clear to auscultation bilaterally, normal work of breathing GI: soft, nontender, nondistended, + BS Ext: without cyanosis, clubbing, or edema, Good distal pulses bilaterally Neuro:  Alert and Oriented x 3 Psych: euthymic mood, full affect  Wt Readings from Last 3 Encounters:  09/12/17 100 lb 6.4 oz (45.5 kg)  03/14/17 100 lb (45.4 kg)  09/14/16 100 lb 12.8 oz (45.7 kg)      Studies/Labs Reviewed:   EKG:  EKG is  ordered today.  The ekg ordered today demonstrates Normal  sinus rhythm  Recent Labs: 03/14/2017: BUN 12; Creatinine, Ser 0.70; Hemoglobin 10.9; Platelets 315; Potassium 4.5; Sodium 141   Lipid Panel No results found for: CHOL, TRIG, HDL, CHOLHDL, VLDL, LDLCALC, LDLDIRECT  Additional studies/ records that were reviewed today include:   2-D echo 2015 Study Conclusions  Left ventricle: The cavity size was normal. Systolic function was normal. The estimated ejection fraction was in the range of 55% to 65%. Wall motion was normal; there were no regional wall motion abnormalities. Transthoracic echocardiography.  M-mode, complete     ASSESSMENT:     1. Persistent atrial fibrillation ([place])   2. Essential hypertension, benign   3. Pure hypercholesterolemia      PLAN:  In order of problems listed above:  Persistent atrial fibrillation on diltiazem and Eliquis.In normal sinus rhythm today. Will check bmet today. Follow-up with Dr. Radford Pax in March.  Essential hypertension blood pressure elevated today but is always high when she comes in here. 115/70 at home.      Medication Adjustments/Labs and Tests Ordered: Current medicines are reviewed at length with the patient today.  Concerns regarding medicines are outlined above.  Medication changes, Labs and Tests ordered today are listed in the Patient Instructions below. There are no Patient Instructions on file for this visit.   Sumner Boast, PA-C  09/12/2017 9:07 AM    [facility] [address] [place], [place], [place]  [address] Phone: [phone]; Fax: [phone]

## 2017-09-12 ENCOUNTER — Encounter: Payer: Self-pay | Admitting: Physician Assistant

## 2017-09-12 ENCOUNTER — Ambulatory Visit (INDEPENDENT_AMBULATORY_CARE_PROVIDER_SITE_OTHER): Payer: Medicare Other | Admitting: Physician Assistant

## 2017-09-12 VITALS — BP 160/58 | HR 92 | Ht 62.5 in | Wt 100.4 lb

## 2017-09-12 DIAGNOSIS — I481 Persistent atrial fibrillation: Secondary | ICD-10-CM | POA: Diagnosis not present

## 2017-09-12 DIAGNOSIS — I10 Essential (primary) hypertension: Secondary | ICD-10-CM | POA: Diagnosis not present

## 2017-09-12 DIAGNOSIS — I4819 Other persistent atrial fibrillation: Secondary | ICD-10-CM

## 2017-09-12 LAB — BASIC METABOLIC PANEL
BUN/Creatinine Ratio: 17 (ref 12–28)
BUN: 12 mg/dL (ref 8–27)
CO2: 24 mmol/L (ref 20–29)
Calcium: 8.9 mg/dL (ref 8.7–10.3)
Chloride: 104 mmol/L (ref 96–106)
Creatinine, Ser: 0.7 mg/dL (ref 0.57–1.00)
GFR calc Af Amer: 95 mL/min/{1.73_m2} (ref >59)
GFR calc non Af Amer: 83 mL/min/{1.73_m2} (ref >59)
Glucose: 92 mg/dL (ref 65–99)
Potassium: 4.9 mmol/L (ref 3.5–5.2)
Sodium: 140 mmol/L (ref 134–144)

## 2017-09-12 NOTE — Patient Instructions (Signed)
Medication Instructions:  1. Your physician recommends that you continue on your current medications as directed. Please refer to the Current Medication list given to you today.   Labwork: TODAY BMET  Testing/Procedures: NONE ORDERED TODAY   Follow-Up: MARCH 2019 WITH DR. Radford Pax, WE WILL SEND OUT A REMINDER LETTER A FEW MONTHS EARLIER TO CALL AND MAKE AN APPT.Marland Kitchen  Any Other Special Instructions Will Be Listed Below (If Applicable).     If you need a refill on your cardiac medications before your next appointment, please call your pharmacy.

## 2017-09-13 ENCOUNTER — Telehealth: Payer: Self-pay

## 2017-09-13 NOTE — Telephone Encounter (Signed)
patient returned phone call message. spoke with patient about recent lab results. patient verbalized understanding.  patient requests results be mailed. labs have been mailed to patient.

## 2017-09-13 NOTE — Telephone Encounter (Signed)
-----   Message from Katrine Coho, RN sent at 09/13/2017  8:14 AM EDT ----- To CMA In Basket

## 2017-09-18 ENCOUNTER — Other Ambulatory Visit: Payer: Self-pay | Admitting: Cardiology

## 2018-03-19 NOTE — Progress Notes (Signed)
Cardiology Office Note:    Date:  03/20/2018   ID:  Kristen Cruz, DOB 11-19-1936, MRN [id number]  PCP:  Patient, No Pcp Per  Cardiologist:  No primary care provider on file.    Referring MD: No ref. provider found   Chief Complaint  Patient presents with  . Atrial Fibrillation  . Hypertension    History of Present Illness:    Kristen Cruz is a 82 y.o. female with a hx of persistent atrial fibrillation and HTN.  SHe is here today for followup and is doing well.  SHe denies any chest pain or pressure, SOB, DOE, PND, orthopnea, LE edema, dizziness, palpitations or syncope. She is compliant with her meds and is tolerating meds with no SE.    Past Medical History:  Diagnosis Date  . Hypertension   . Persistent atrial fibrillation (HCC)    CHADS2VASC score is 4 on Apixaban    History reviewed. No pertinent surgical history.  Current Medications: Current Meds  Medication Sig  . diltiazem (DILACOR XR) 180 MG 24 hr capsule Take 1 capsule (180 mg total) by mouth daily.  Marland Kitchen ELIQUIS 5 MG TABS tablet TAKE 1 TABLET BY MOUTH TWICE A DAY  . Polyvinyl Alcohol-Povidone (REFRESH OP) Apply 2 drops to eye at bedtime.      Allergies:   Asa [aspirin]; Bentyl [dicyclomine hcl]; Sucrets [dyclonine]; Sulfa antibiotics; Tetracycline hcl; and Tylenol [acetaminophen]   Social History   Socioeconomic History  . Marital status: Married    Spouse name: Not on file  . Number of children: Not on file  . Years of education: Not on file  . Highest education level: Not on file  Occupational History  . Not on file  Social Needs  . Financial resource strain: Not on file  . Food insecurity:    Worry: Not on file    Inability: Not on file  . Transportation needs:    Medical: Not on file    Non-medical: Not on file  Tobacco Use  . Smoking status: Never Smoker  . Smokeless tobacco: Never Used  Substance and Sexual Activity  . Alcohol use: No  . Drug use: No  . Sexual activity: Not on  file  Lifestyle  . Physical activity:    Days per week: Not on file    Minutes per session: Not on file  . Stress: Not on file  Relationships  . Social connections:    Talks on phone: Not on file    Gets together: Not on file    Attends religious service: Not on file    Active member of club or organization: Not on file    Attends meetings of clubs or organizations: Not on file    Relationship status: Not on file  Other Topics Concern  . Not on file  Social History Narrative  . Not on file     Family History: The patient's family history includes Heart Problems in her mother; Hypertension in her mother.  ROS:   Please see the history of present illness.    ROS  All other systems reviewed and negative.   EKGs/Labs/Other Studies Reviewed:    The following studies were reviewed today: none  EKG:  EKG is  ordered today and showed NSR at 87bpm with IRBBB and PACs and LAE Recent Labs: 09/12/2017: BUN 12; Creatinine, Ser 0.70; Potassium 4.9; Sodium 140   Recent Lipid Panel No results found for: CHOL, TRIG, HDL, CHOLHDL, VLDL, LDLCALC, LDLDIRECT  Physical Exam:  VS:  BP (!) 144/78   Pulse 87   Ht 5\' 2"  (1.575 m)   Wt 98 lb (44.5 kg)   SpO2 97%   BMI 17.92 kg/m     Wt Readings from Last 3 Encounters:  03/20/18 98 lb (44.5 kg)  09/12/17 100 lb 6.4 oz (45.5 kg)  03/14/17 100 lb (45.4 kg)     GEN:  Well nourished, well developed in no acute distress HEENT: Normal NECK: No JVD; No carotid bruits LYMPHATICS: No lymphadenopathy CARDIAC: RRR, no murmurs, rubs, gallops RESPIRATORY:  Clear to auscultation without rales, wheezing or rhonchi  ABDOMEN: Soft, non-tender, non-distended MUSCULOSKELETAL:  No edema; No deformity  SKIN: Warm and dry NEUROLOGIC:  Alert and oriented x 3 PSYCHIATRIC:  Normal affect   ASSESSMENT:    1. Persistent atrial fibrillation ([place])   2. Essential hypertension, benign   3. Pure hypercholesterolemia    PLAN:    In order of problems  listed above:  1.  PAF - she is maintaining NSR on exam today.  She will continue on Cardizem 180mg  daily and Eliquis 5mg  BID for a CHADS2VASC score of 4.  She denies any excessive bleeding problems on anticoagulation.   2.  HTN - BP well controlled on exam today.  She will continue on Cardizem.  3.  Hyperlipidemia followed by PCP.  Currently diet controlled.    Medication Adjustments/Labs and Tests Ordered: Current medicines are reviewed at length with the patient today.  Concerns regarding medicines are outlined above.  Orders Placed This Encounter  Procedures  . EKG 12-Lead   No orders of the defined types were placed in this encounter.   Signed, Fransico Him, MD  03/20/2018 8:47 AM    [place]

## 2018-03-20 ENCOUNTER — Ambulatory Visit (INDEPENDENT_AMBULATORY_CARE_PROVIDER_SITE_OTHER): Payer: Medicare Other | Admitting: Cardiology

## 2018-03-20 ENCOUNTER — Encounter: Payer: Self-pay | Admitting: Cardiology

## 2018-03-20 VITALS — BP 144/78 | HR 87 | Ht 62.0 in | Wt 98.0 lb

## 2018-03-20 DIAGNOSIS — I481 Persistent atrial fibrillation: Secondary | ICD-10-CM

## 2018-03-20 DIAGNOSIS — I4819 Other persistent atrial fibrillation: Secondary | ICD-10-CM

## 2018-03-20 DIAGNOSIS — E7800 Pure hypercholesterolemia, unspecified: Secondary | ICD-10-CM

## 2018-03-20 DIAGNOSIS — I10 Essential (primary) hypertension: Secondary | ICD-10-CM

## 2018-03-20 LAB — BASIC METABOLIC PANEL
BUN/Creatinine Ratio: 14 (ref 12–28)
BUN: 11 mg/dL (ref 8–27)
CO2: 23 mmol/L (ref 20–29)
Calcium: 8.8 mg/dL (ref 8.7–10.3)
Chloride: 103 mmol/L (ref 96–106)
Creatinine, Ser: 0.77 mg/dL (ref 0.57–1.00)
GFR calc Af Amer: 85 mL/min/{1.73_m2} (ref >59)
GFR calc non Af Amer: 74 mL/min/{1.73_m2} (ref >59)
Glucose: 94 mg/dL (ref 65–99)
Potassium: 4.1 mmol/L (ref 3.5–5.2)
Sodium: 139 mmol/L (ref 134–144)

## 2018-03-20 LAB — CBC
Hematocrit: 28.8 % — ABNORMAL LOW (ref 34.0–46.6)
Hemoglobin: 9.1 g/dL — ABNORMAL LOW (ref 11.1–15.9)
MCH: 24.5 pg — ABNORMAL LOW (ref 26.6–33.0)
MCHC: 31.6 g/dL (ref 31.5–35.7)
MCV: 78 fL — ABNORMAL LOW (ref 79–97)
Platelets: 344 10*3/uL (ref 150–379)
RBC: 3.71 x10E6/uL — ABNORMAL LOW (ref 3.77–5.28)
RDW: 15.1 % (ref 12.3–15.4)
WBC: 5.7 10*3/uL (ref 3.4–10.8)

## 2018-03-20 NOTE — Patient Instructions (Signed)
Medication Instructions:  Your physician recommends that you continue on your current medications as directed. Please refer to the Current Medication list given to you today.  If you need a refill on your cardiac medications, please contact your pharmacy first.  Labwork: Today for basic metabolic panel and complete blood count   Testing/Procedures: None ordered   Follow-Up: Your physician wants you to follow-up in: 6 months with Dr. Radford Pax. You will receive a reminder letter in the mail two months in advance. If you don't receive a letter, please call our office to schedule the follow-up appointment.  Any Other Special Instructions Will Be Listed Below (If Applicable).   Thank you for choosing [facility], RN  [phone]  If you need a refill on your cardiac medications before your next appointment, please call your pharmacy.

## 2018-03-22 ENCOUNTER — Telehealth: Payer: Self-pay

## 2018-03-22 NOTE — Telephone Encounter (Signed)
I have done an Eliquis PA through [employer]ymeds. 

## 2018-03-23 ENCOUNTER — Telehealth: Payer: Self-pay | Admitting: Cardiology

## 2018-03-23 ENCOUNTER — Other Ambulatory Visit: Payer: Self-pay | Admitting: Cardiology

## 2018-03-23 NOTE — Telephone Encounter (Signed)
Left message to call back regarding lab results.

## 2018-03-23 NOTE — Telephone Encounter (Signed)
New message ° ° °Patient calling for lab results. Please call °

## 2018-03-23 NOTE — Telephone Encounter (Signed)
Eliquis 5mg  refill request received; pt is 82 yrs old, wt-44.5kg, Crea-0.77 on 03/20/18, last seen by Dr. Radford Pax on 03/20/18; will end in refill to requested pharmacy.

## 2018-03-23 NOTE — Telephone Encounter (Signed)
Notes recorded by Teressa Senter, RN on 03/23/2018 at 11:59 AM EDT Patient made aware of lab results. I informed patient to follow up with PCP in regards to Hb levels. Patient stated she does not have PCP because she does not get sick. I offered patient [facility] information to establish care with a primary care. [facility]. I explained the importance of having a primary MD and follow up regarding Hb and being on an anticoagulate. Patient verbalized understanding and thankful for the call.

## 2018-03-29 ENCOUNTER — Other Ambulatory Visit: Payer: Self-pay | Admitting: Cardiology

## 2018-06-06 NOTE — Telephone Encounter (Signed)
Message received through [employer]ymeds: Approved on April 17  Case Id:[id number];Status:Approved;Review Type:Prior Auth;Coverage Start Date:02/20/2018;Coverage End Date:03/22/2019

## 2018-09-05 ENCOUNTER — Other Ambulatory Visit: Payer: Self-pay | Admitting: Cardiology

## 2018-09-21 NOTE — Progress Notes (Signed)
Cardiology Office Note:    Date:  09/22/2018   ID:  Kristen Cruz, DOB July 09, 1936, MRN [id number]  PCP:  Patient, No Pcp Per  Cardiologist:  Fransico Him, MD    Referring MD: No ref. provider found   Chief Complaint  Patient presents with  . Atrial Fibrillation  . Hypertension    History of Present Illness:    Kristen Cruz is a 82 y.o. female with a hx of persistent atrial fibrillationand HTN. She is here today for followup and is doing well.  She denies any chest pain or pressure, SOB, DOE, PND, orthopnea, LE edema, dizziness, palpitations or syncope. She is compliant with her meds and is tolerating meds with no SE.    Past Medical History:  Diagnosis Date  . Hypertension   . Persistent atrial fibrillation    CHADS2VASC score is 4 on Apixaban    No past surgical history on file.  Current Medications: Current Meds  Medication Sig  . DILT-XR 180 MG 24 hr capsule TAKE 1 CAPSULE BY MOUTH EVERY DAY  . ELIQUIS 5 MG TABS tablet TAKE 1 TABLET BY MOUTH TWICE A DAY  . Polyvinyl Alcohol-Povidone (REFRESH OP) Apply 2 drops to eye at bedtime.      Allergies:   Asa [aspirin]; Bentyl [dicyclomine hcl]; Sucrets [dyclonine]; Sulfa antibiotics; Tetracycline hcl; and Tylenol [acetaminophen]   Social History   Socioeconomic History  . Marital status: Married    Spouse name: Not on file  . Number of children: Not on file  . Years of education: Not on file  . Highest education level: Not on file  Occupational History  . Not on file  Social Needs  . Financial resource strain: Not on file  . Food insecurity:    Worry: Not on file    Inability: Not on file  . Transportation needs:    Medical: Not on file    Non-medical: Not on file  Tobacco Use  . Smoking status: Never Smoker  . Smokeless tobacco: Never Used  Substance and Sexual Activity  . Alcohol use: No  . Drug use: No  . Sexual activity: Not on file  Lifestyle  . Physical activity:    Days per week: Not on  file    Minutes per session: Not on file  . Stress: Not on file  Relationships  . Social connections:    Talks on phone: Not on file    Gets together: Not on file    Attends religious service: Not on file    Active member of club or organization: Not on file    Attends meetings of clubs or organizations: Not on file    Relationship status: Not on file  Other Topics Concern  . Not on file  Social History Narrative  . Not on file     Family History: The patient's family history includes Heart Problems in her mother; Hypertension in her mother.  ROS:   Please see the history of present illness.    ROS  All other systems reviewed and negative.   EKGs/Labs/Other Studies Reviewed:    The following studies were reviewed today: none  EKG:  EKG is not ordered today.    Recent Labs: 03/20/2018: BUN 11; Creatinine, Ser 0.77; Hemoglobin 9.1; Platelets 344; Potassium 4.1; Sodium 139   Recent Lipid Panel No results found for: CHOL, TRIG, HDL, CHOLHDL, VLDL, LDLCALC, LDLDIRECT  Physical Exam:    VS:  BP 136/74   Pulse 95   Ht  5\' 2"  (1.575 m)   Wt 99 lb (44.9 kg)   SpO2 96%   BMI 18.11 kg/m     Wt Readings from Last 3 Encounters:  09/22/18 99 lb (44.9 kg)  03/20/18 98 lb (44.5 kg)  09/12/17 100 lb 6.4 oz (45.5 kg)     GEN:  Well nourished, well developed in no acute distress HEENT: Normal NECK: No JVD; No carotid bruits LYMPHATICS: No lymphadenopathy CARDIAC: RRR, no murmurs, rubs, gallops RESPIRATORY:  Clear to auscultation without rales, wheezing or rhonchi  ABDOMEN: Soft, non-tender, non-distended MUSCULOSKELETAL:  No edema; No deformity  SKIN: Warm and dry NEUROLOGIC:  Alert and oriented x 3 PSYCHIATRIC:  Normal affect   ASSESSMENT:    1. Persistent atrial fibrillation ([place])   2. Essential hypertension, benign   3. Pure hypercholesterolemia    PLAN:    In order of problems listed above:  1.  Persistent atrial fibrillation - she is maintaining NSR on exam  today.  She will continue on Eliquis 5mg  BID and Diltiazem XR 180mg  daily.  Her creatinine was 0.77 and K+ 4.1 on 03/20/2018.  I will check a Hbg today.   2.  HTN - BP is controlled on exam today.  She will continue on Diltiazem XR 180mg  daily.    3.  Hyperlipidemia - diet controlled on followed by PCP.   Medication Adjustments/Labs and Tests Ordered: Current medicines are reviewed at length with the patient today.  Concerns regarding medicines are outlined above.  No orders of the defined types were placed in this encounter.  No orders of the defined types were placed in this encounter.   Signed, Fransico Him, MD  09/22/2018 8:53 AM    [place]

## 2018-09-22 ENCOUNTER — Observation Stay (HOSPITAL_COMMUNITY)
Admission: EM | Admit: 2018-09-22 | Discharge: 2018-09-23 | Disposition: A | Discharge status: Home / Self Care | Payer: Medicare Other | Attending: Internal Medicine | Admitting: Internal Medicine

## 2018-09-22 ENCOUNTER — Encounter (HOSPITAL_COMMUNITY): Payer: Self-pay | Admitting: Emergency Medicine

## 2018-09-22 ENCOUNTER — Encounter: Payer: Self-pay | Admitting: Cardiology

## 2018-09-22 ENCOUNTER — Ambulatory Visit (INDEPENDENT_AMBULATORY_CARE_PROVIDER_SITE_OTHER): Payer: Medicare Other | Admitting: Cardiology

## 2018-09-22 ENCOUNTER — Emergency Department (HOSPITAL_COMMUNITY): Payer: Medicare Other

## 2018-09-22 VITALS — BP 136/74 | HR 95 | Ht 62.0 in | Wt 99.0 lb

## 2018-09-22 DIAGNOSIS — I4819 Other persistent atrial fibrillation: Secondary | ICD-10-CM | POA: Insufficient documentation

## 2018-09-22 DIAGNOSIS — N200 Calculus of kidney: Secondary | ICD-10-CM | POA: Diagnosis not present

## 2018-09-22 DIAGNOSIS — Z7901 Long term (current) use of anticoagulants: Secondary | ICD-10-CM | POA: Diagnosis not present

## 2018-09-22 DIAGNOSIS — E7800 Pure hypercholesterolemia, unspecified: Secondary | ICD-10-CM

## 2018-09-22 DIAGNOSIS — I10 Essential (primary) hypertension: Secondary | ICD-10-CM | POA: Insufficient documentation

## 2018-09-22 DIAGNOSIS — R195 Other fecal abnormalities: Secondary | ICD-10-CM | POA: Insufficient documentation

## 2018-09-22 DIAGNOSIS — Z8719 Personal history of other diseases of the digestive system: Secondary | ICD-10-CM

## 2018-09-22 DIAGNOSIS — E785 Hyperlipidemia, unspecified: Secondary | ICD-10-CM | POA: Diagnosis not present

## 2018-09-22 DIAGNOSIS — Z79899 Other long term (current) drug therapy: Secondary | ICD-10-CM | POA: Insufficient documentation

## 2018-09-22 DIAGNOSIS — D649 Anemia, unspecified: Secondary | ICD-10-CM

## 2018-09-22 DIAGNOSIS — D508 Other iron deficiency anemias: Principal | ICD-10-CM | POA: Insufficient documentation

## 2018-09-22 DIAGNOSIS — D509 Iron deficiency anemia, unspecified: Secondary | ICD-10-CM | POA: Diagnosis present

## 2018-09-22 DIAGNOSIS — K922 Gastrointestinal hemorrhage, unspecified: Secondary | ICD-10-CM | POA: Diagnosis not present

## 2018-09-22 DIAGNOSIS — I480 Paroxysmal atrial fibrillation: Secondary | ICD-10-CM | POA: Diagnosis present

## 2018-09-22 HISTORY — DX: Personal history of other diseases of the digestive system: Z87.19

## 2018-09-22 LAB — COMPREHENSIVE METABOLIC PANEL
ALT: 17 U/L (ref 0–44)
AST: 22 U/L (ref 15–41)
Albumin: 4.4 g/dL (ref 3.5–5.0)
Alkaline Phosphatase: 93 U/L (ref 38–126)
Anion gap: 10 (ref 5–15)
BUN: 22 mg/dL (ref 8–23)
CO2: 24 mmol/L (ref 22–32)
Calcium: 9.1 mg/dL (ref 8.9–10.3)
Chloride: 108 mmol/L (ref 98–111)
Creatinine, Ser: 0.77 mg/dL (ref 0.44–1.00)
GFR calc Af Amer: >60 mL/min (ref >60)
GFR calc non Af Amer: >60 mL/min (ref >60)
Glucose, Bld: 124 mg/dL — ABNORMAL HIGH (ref 70–99)
Potassium: 3.8 mmol/L (ref 3.5–5.1)
Sodium: 142 mmol/L (ref 135–145)
Total Bilirubin: 0.4 mg/dL (ref 0.3–1.2)
Total Protein: 7.5 g/dL (ref 6.5–8.1)

## 2018-09-22 LAB — CBC
HCT: 28 % — ABNORMAL LOW (ref 36.0–46.0)
Hematocrit: 24.8 % — ABNORMAL LOW (ref 34.0–46.6)
Hemoglobin: 7.6 g/dL — ABNORMAL LOW (ref 11.1–15.9)
Hemoglobin: 8.3 g/dL — ABNORMAL LOW (ref 12.0–15.0)
MCH: 21.7 pg — ABNORMAL LOW (ref 26.6–33.0)
MCH: 21.7 pg — ABNORMAL LOW (ref 26.0–34.0)
MCHC: 30.6 g/dL — ABNORMAL LOW (ref 31.5–35.7)
MCHC: 29.6 g/dL — ABNORMAL LOW (ref 30.0–36.0)
MCV: 71 fL — ABNORMAL LOW (ref 79–97)
MCV: 73.1 fL — ABNORMAL LOW (ref 78.0–100.0)
Platelets: 358 10*3/uL (ref 150–450)
Platelets: 386 10*3/uL (ref 150–400)
RBC: 3.51 x10E6/uL — ABNORMAL LOW (ref 3.77–5.28)
RBC: 3.83 MIL/uL — ABNORMAL LOW (ref 3.87–5.11)
RDW: 16.3 % — ABNORMAL HIGH (ref 12.3–15.4)
RDW: 15.8 % — ABNORMAL HIGH (ref 11.5–15.5)
WBC: 5.5 10*3/uL (ref 3.4–10.8)
WBC: 5.8 10*3/uL (ref 4.0–10.5)

## 2018-09-22 LAB — URINALYSIS, ROUTINE W REFLEX MICROSCOPIC
Bacteria, UA: NONE SEEN
Bilirubin Urine: NEGATIVE
Glucose, UA: NEGATIVE mg/dL
Ketones, ur: NEGATIVE mg/dL
Leukocytes, UA: NEGATIVE
Nitrite: NEGATIVE
Protein, ur: NEGATIVE mg/dL
Specific Gravity, Urine: 1.004 — ABNORMAL LOW (ref 1.005–1.030)
pH: 6 (ref 5.0–8.0)

## 2018-09-22 LAB — ABO/RH: ABO/RH(D): O POS

## 2018-09-22 LAB — POC OCCULT BLOOD, ED: Fecal Occult Bld: POSITIVE — AB

## 2018-09-22 MED ORDER — ONDANSETRON HCL 4 MG PO TABS: 4.0000 mg | ORAL_TABLET | Freq: Four times a day (QID) | ORAL | Status: DC | PRN

## 2018-09-22 MED ORDER — APIXABAN 5 MG PO TABS: 5.0000 mg | ORAL_TABLET | Freq: Two times a day (BID) | ORAL | 3 refills | Status: DC

## 2018-09-22 MED ORDER — IOPAMIDOL (ISOVUE-300) INJECTION 61%
INTRAVENOUS | Status: AC
  Filled 2018-09-22: qty 100

## 2018-09-22 MED ORDER — SODIUM CHLORIDE 0.9 % IV SOLN
80.0000 mg | Freq: Once | INTRAVENOUS | Status: AC
  Administered 2018-09-22: 80 mg via INTRAVENOUS
  Filled 2018-09-22: qty 80

## 2018-09-22 MED ORDER — SODIUM CHLORIDE 0.9 % IJ SOLN
INTRAMUSCULAR | Status: AC
  Filled 2018-09-22: qty 50

## 2018-09-22 MED ORDER — IOPAMIDOL (ISOVUE-300) INJECTION 61%
100.0000 mL | Freq: Once | INTRAVENOUS | Status: AC | PRN
  Administered 2018-09-22: 100 mL via INTRAVENOUS

## 2018-09-22 MED ORDER — DILTIAZEM HCL ER COATED BEADS 180 MG PO CP24
180.0000 mg | ORAL_CAPSULE | Freq: Every day | ORAL | Status: DC
  Administered 2018-09-23: 180 mg via ORAL
  Filled 2018-09-22: qty 1

## 2018-09-22 MED ORDER — SODIUM CHLORIDE 0.9 % IV SOLN
8.0000 mg/h | INTRAVENOUS | Status: DC
  Administered 2018-09-22 – 2018-09-23 (×2): 8 mg/h via INTRAVENOUS
  Filled 2018-09-22 (×4): qty 80

## 2018-09-22 MED ORDER — ONDANSETRON HCL 4 MG/2ML IJ SOLN: 4.0000 mg | Freq: Four times a day (QID) | INTRAMUSCULAR | Status: DC | PRN

## 2018-09-22 NOTE — ED Notes (Signed)
ED TO INPATIENT HANDOFF REPORT  Name/Age/Gender Kristen Cruz 82 y.o. female  Code Status   Home/SNF/Other Home  Chief Complaint low hemoglobin  Level of Care/Admitting Diagnosis ED Disposition    ED Disposition Condition [facility] Area: [place] [010272]  Level of Care: Telemetry [5]  Admit to tele based on following criteria: Complex arrhythmia (Bradycardia/Tachycardia)  Diagnosis: GI bleed [536644]  Admitting Physician: Rise Patience [phone]  Attending Physician: Rise Patience [email or web address]  PT Class (Do Not Modify): Observation [104]  PT Acc Code (Do Not Modify): Observation [10022]       Medical History Past Medical History:  Diagnosis Date  . Hypertension   . Persistent atrial fibrillation    CHADS2VASC score is 4 on Apixaban    Allergies Allergies  Allergen Reactions  . Asa [Aspirin] Rash  . Bentyl [Dicyclomine Hcl] Rash  . Sucrets [Dyclonine] Rash  . Sulfa Antibiotics Rash  . Tetracycline Hcl Rash  . Tylenol [Acetaminophen] Rash    IV Location/Drains/Wounds Patient Lines/Drains/Airways Status   Active Line/Drains/Airways    Name:   Placement date:   Placement time:   Site:   Days:   Peripheral IV 09/22/18 Left Antecubital   09/22/18    1831    Antecubital   less than 1          Labs/Imaging Results for orders placed or performed during the hospital encounter of 09/22/18 (from the past 48 hour(s))  Comprehensive metabolic panel     Status: Abnormal   Collection Time: 09/22/18  6:32 PM  Result Value Ref Range   Sodium 142 135 - 145 mmol/L   Potassium 3.8 3.5 - 5.1 mmol/L   Chloride 108 98 - 111 mmol/L   CO2 24 22 - 32 mmol/L   Glucose, Bld 124 (H) 70 - 99 mg/dL   BUN 22 8 - 23 mg/dL   Creatinine, Ser 0.77 0.44 - 1.00 mg/dL   Calcium 9.1 8.9 - 10.3 mg/dL   Total Protein 7.5 6.5 - 8.1 g/dL   Albumin 4.4 3.5 - 5.0 g/dL   AST 22 15 - 41 U/L   ALT 17 0 - 44 U/L   Alkaline Phosphatase 93 38 -  126 U/L   Total Bilirubin 0.4 0.3 - 1.2 mg/dL   GFR calc non Af Amer >60 >60 mL/min   GFR calc Af Amer >60 >60 mL/min    Comment: (NOTE) The eGFR has been calculated using the CKD EPI equation. This calculation has not been validated in all clinical situations. eGFR's persistently <60 mL/min signify possible Chronic Kidney Disease.    Anion gap 10 5 - 15    Comment: Performed at [facility], [place] [address], [place], [place] [address]  CBC     Status: Abnormal   Collection Time: 09/22/18  6:32 PM  Result Value Ref Range   WBC 5.8 4.0 - 10.5 K/uL   RBC 3.83 (L) 3.87 - 5.11 MIL/uL   Hemoglobin 8.3 (L) 12.0 - 15.0 g/dL   HCT 28.0 (L) 36.0 - 46.0 %   MCV 73.1 (L) 78.0 - 100.0 fL   MCH 21.7 (L) 26.0 - 34.0 pg   MCHC 29.6 (L) 30.0 - 36.0 g/dL   RDW 15.8 (H) 11.5 - 15.5 %   Platelets 386 150 - 400 K/uL    Comment: Performed at [facility], [place] [address] [place], [place] [address]  Type and screen [facility]  Status: None   Collection Time: 09/22/18  6:32 PM  Result Value Ref Range   ABO/RH(D) O POS    Antibody Screen NEG    Sample Expiration      09/25/2018 Performed at [facility], [place] [address], [place], [place] [address]   Urinalysis, Routine w reflex microscopic     Status: Abnormal   Collection Time: 09/22/18  6:32 PM  Result Value Ref Range   Color, Urine COLORLESS (A) YELLOW   APPearance CLEAR CLEAR   Specific Gravity, Urine 1.004 (L) 1.005 - 1.030   pH 6.0 5.0 - 8.0   Glucose, UA NEGATIVE NEGATIVE mg/dL   Hgb urine dipstick MODERATE (A) NEGATIVE   Bilirubin Urine NEGATIVE NEGATIVE   Ketones, ur NEGATIVE NEGATIVE mg/dL   Protein, ur NEGATIVE NEGATIVE mg/dL   Nitrite NEGATIVE NEGATIVE   Leukocytes, UA NEGATIVE NEGATIVE   RBC / HPF 6-10 0 - 5 RBC/hpf   WBC, UA 0-5 0 - 5 WBC/hpf   Bacteria, UA NONE SEEN NONE SEEN   Squamous Epithelial / LPF 0-5 0 - 5    Comment: Performed at [facility], [place] [address], [place], [place] [address]  POC occult blood, ED     Status: Abnormal   Collection Time: 09/22/18  6:52 PM  Result Value Ref Range   Fecal Occult Bld POSITIVE (A) NEGATIVE   No results found.  Pending Labs Unresulted Labs (From admission, onward)    Start     Ordered   09/22/18 1935  ABO/Rh  Once,   R     09/22/18 1935          Vitals/Pain Today's Vitals   09/22/18 1723 09/22/18 1727 09/22/18 1949  BP: (!) 165/80  (!) 154/83  Pulse: (!) 110  (!) 106  Resp: (!) 22  16  Temp: 98.3 F (36.8 C)    TempSrc: Oral    SpO2: 99%  100%  PainSc:  0-No pain     Isolation Precautions No active isolations  Medications Medications  pantoprazole (PROTONIX) 80 mg in sodium chloride 0.9 % 100 mL IVPB (80 mg Intravenous New Bag/Given 09/22/18 2047)  pantoprazole (PROTONIX) 80 mg in sodium chloride 0.9 % 250 mL (0.32 mg/mL) infusion (has no administration in time range)  iopamidol (ISOVUE-300) 61 % injection (has no administration in time range)  sodium chloride 0.9 % injection (has no administration in time range)  iopamidol (ISOVUE-300) 61 % injection 100 mL (has no administration in time range)    Mobility Walks

## 2018-09-22 NOTE — ED Provider Notes (Signed)
[facility] DEPT Provider Note   CSN: [id number] Arrival date & time: 09/22/18  1706     History   Chief Complaint Chief Complaint  Patient presents with  . Abnormal Lab    HPI Kristen Cruz is a 82 y.o. female.  Pt presents to the ED today with low hemoglobin.  The pt went to Dr. Theodosia Blender office for a check for a.fib.  She is on Eliquis.  Pt said she's been a little tired, but not too bad.  She said she did exercise today and felt ok.  Dr. Radford Pax ordered a CBC which came back with a low hemoglobin.  Pt denies any active bleeding or dark stool.  The pt was told to come here.  She has never had a colonoscopy/EGD.  CHA2DS2/VAS Stroke Risk Points  Current as of 5 minutes ago     4 >= 2 Points: High Risk  1 - 1.99 Points: Medium Risk  0 Points: Low Risk    This is the only CHA2DS2/VAS Stroke Risk Points available for the past  year.:  Last Change: N/A     Details    This score determines the patient's risk of having a stroke if the  patient has atrial fibrillation.       Points Metrics  0 Has Congestive Heart Failure:  No    Current as of 5 minutes ago  0 Has Vascular Disease:  No    Current as of 5 minutes ago  1 Has Hypertension:  Yes    Current as of 5 minutes ago  2 Age:  53    Current as of 5 minutes ago  0 Has Diabetes:  No    Current as of 5 minutes ago  0 Had Stroke:  No  Had TIA:  No  Had thromboembolism:  No    Current as of 5 minutes ago  1 Female:  Yes    Current as of 5 minutes ago              Past Medical History:  Diagnosis Date  . Hypertension   . Persistent atrial fibrillation    CHADS2VASC score is 4 on Apixaban    Patient Active Problem List   Diagnosis Date Noted  . GI bleed 09/22/2018  . Incomplete RBBB 02/27/2015  . Sinus tachycardia 02/20/2014  . Heart murmur 02/20/2014  . Persistent atrial fibrillation ([place])   . Essential hypertension, benign 02/19/2014  . Pure hypercholesterolemia  02/19/2014    History reviewed. No pertinent surgical history.   OB History   None      Home Medications    Prior to Admission medications   Medication Sig Start Date End Date Taking? Authorizing Provider  apixaban (ELIQUIS) 5 MG TABS tablet Take 1 tablet (5 mg total) by mouth 2 (two) times daily. 09/22/18 09/22/19  Sueanne Margarita, MD  DILT-XR 180 MG 24 hr capsule TAKE 1 CAPSULE BY MOUTH EVERY DAY 09/05/18   Sueanne Margarita, MD  Polyvinyl Alcohol-Povidone (REFRESH OP) Apply 2 drops to eye at bedtime.     [provider]    Family History Family History  Problem Relation Age of Onset  . Hypertension Mother   . Heart Problems Mother     Social History Social History   Tobacco Use  . Smoking status: Never Smoker  . Smokeless tobacco: Never Used  Substance Use Topics  . Alcohol use: No  . Drug use: No  Allergies   Asa [aspirin]; Bentyl [dicyclomine hcl]; Sucrets [dyclonine]; Sulfa antibiotics; Tetracycline hcl; and Tylenol [acetaminophen]   Review of Systems Review of Systems  All other systems reviewed and are negative.    Physical Exam Updated Vital Signs BP (!) 154/83 (BP Location: Right Arm)   Pulse (!) 106   Temp 98.3 F (36.8 C) (Oral)   Resp 16   SpO2 100%   Physical Exam  Constitutional: She is oriented to person, place, and time. She appears well-developed and well-nourished.  HENT:  Head: Normocephalic and atraumatic.  Right Ear: External ear normal.  Left Ear: External ear normal.  Nose: Nose normal.  Mouth/Throat: Oropharynx is clear and moist.  Eyes: Pupils are equal, round, and reactive to light. Conjunctivae and EOM are normal.  Neck: Normal range of motion. Neck supple.  Cardiovascular: Normal rate, regular rhythm, normal heart sounds and intact distal pulses.  Pulmonary/Chest: Effort normal and breath sounds normal.  Abdominal: Soft. Bowel sounds are normal.  Musculoskeletal: Normal range of motion.  Neurological: She is  alert and oriented to person, place, and time.  Skin: Skin is warm. Capillary refill takes less than 2 seconds.  Psychiatric: She has a normal mood and affect. Her behavior is normal. Judgment and thought content normal.  Nursing note and vitals reviewed.    ED Treatments / Results  Labs (all labs ordered are listed, but only abnormal results are displayed) Labs Reviewed  COMPREHENSIVE METABOLIC PANEL - Abnormal; Notable for the following components:      Result Value   Glucose, Bld 124 (*)    All other components within normal limits  CBC - Abnormal; Notable for the following components:   RBC 3.83 (*)    Hemoglobin 8.3 (*)    HCT 28.0 (*)    MCV 73.1 (*)    MCH 21.7 (*)    MCHC 29.6 (*)    RDW 15.8 (*)    All other components within normal limits  URINALYSIS, ROUTINE W REFLEX MICROSCOPIC - Abnormal; Notable for the following components:   Color, Urine COLORLESS (*)    Specific Gravity, Urine 1.004 (*)    Hgb urine dipstick MODERATE (*)    All other components within normal limits  POC OCCULT BLOOD, ED - Abnormal; Notable for the following components:   Fecal Occult Bld POSITIVE (*)    All other components within normal limits  TYPE AND SCREEN    EKG EKG Interpretation  Date/Time:  Friday September 22 2018 18:37:23 EDT Ventricular Rate:  88 PR Interval:    QRS Duration: 90 QT Interval:  360 QTC Calculation: 436 R Axis:   -25 Text Interpretation:  Sinus rhythm Probable left atrial enlargement Borderline left axis deviation Abnormal R-wave progression, early transition No old tracing to compare Confirmed by Isla Pence [phone]) on 09/22/2018 6:58:32 PM   Radiology No results found.  Procedures Procedures (including critical care time)  Medications Ordered in ED Medications  pantoprazole (PROTONIX) 80 mg in sodium chloride 0.9 % 100 mL IVPB (has no administration in time range)  pantoprazole (PROTONIX) 80 mg in sodium chloride 0.9 % 250 mL (0.32 mg/mL) infusion  (has no administration in time range)  iopamidol (ISOVUE-300) 61 % injection (has no administration in time range)  sodium chloride 0.9 % injection (has no administration in time range)  iopamidol (ISOVUE-300) 61 % injection 100 mL (has no administration in time range)     Initial Impression / Assessment and Plan / ED Course  I have reviewed  the triage vital signs and the nursing notes.  Pertinent labs & imaging results that were available during my care of the patient were reviewed by me and considered in my medical decision making (see chart for details).     Pt d/w Dr. Watt Climes (GI) who recommends holding Eliquis.  He also wanted [place] to get a CT abd/pelvis.  He said if pt's hgb does not drop more, they can see her as an outpatient.  I held off on transfusion as hgb now above 8 and she's not sob.  Pt d/w Dr. Hal Hope ([facility]) for admission.  Final Clinical Impressions(s) / ED Diagnoses   Final diagnoses:  Symptomatic anemia  Gastrointestinal hemorrhage, unspecified gastrointestinal hemorrhage type  On apixaban therapy    ED Discharge Orders    None       Isla Pence, MD 09/22/18 2035

## 2018-09-22 NOTE — ED Notes (Signed)
Pt went to restroom but didn't realize we needed an urine specimen. Pt informed of need for urine specimen.

## 2018-09-22 NOTE — ED Notes (Signed)
Bed: WA17 Expected date:  Expected time:  Means of arrival:  Comments: Hold for triage 4 

## 2018-09-22 NOTE — ED Triage Notes (Signed)
Patient here from home with complaints of abnormal labs. Hgb 7.6. Denies dizziness, SOB. Denies bleeding.

## 2018-09-22 NOTE — H&P (Signed)
History and Physical    Kristen Cruz HUT:[id number] DOB: June 13, 1936 DOA: 09/22/2018  PCP: Patient, No Pcp Per  Patient coming from: Home.  Chief Complaint: Low hemoglobin.  HPI: Kristen Cruz is a 82 y.o. female with history of persistent atrial fibrillation hypertension hyperlipidemia had visited her cardiologist today and had routine blood work done which showed a hemoglobin of around 7.3 which is a drop of 2 g from patient's baseline.  Patient is on Eliquis and there was some concern for blood loss and was referred to the ER.  Patient states patient has not noticed any obvious GI bleed like black stools or frank rectal bleeding or any vomiting.  Denies any abdominal pain or any weakness or shortness of breath or chest pain.  Patient states she does have history of hemorrhoids.  ED Course: In the ER stool for occult blood was positive.  Hemoglobin is around 8.3.  ER physician discussed with on-call gastroenterologist Dr. Watt Climes who advised at this time to admit for observation and run serial CBC.  If patient does not have any significant decline in CBC and then follow-up with Dr. Watt Climes as outpatient.  Advised to reconsult in the morning.  Advised to hold off apixaban for now.  Review of Systems: As per HPI, rest all negative.   Past Medical History:  Diagnosis Date  . Hypertension   . Persistent atrial fibrillation    CHADS2VASC score is 4 on Apixaban    History reviewed. No pertinent surgical history.   reports that she has never smoked. She has never used smokeless tobacco. She reports that she does not drink alcohol or use drugs.  Allergies  Allergen Reactions  . Asa [Aspirin] Rash  . Bentyl [Dicyclomine Hcl] Rash  . Sucrets [Dyclonine] Rash  . Sulfa Antibiotics Rash  . Tetracycline Hcl Rash  . Tylenol [Acetaminophen] Rash    Family History  Problem Relation Age of Onset  . Hypertension Mother   . Heart Problems Mother     Prior to Admission medications     Medication Sig Start Date End Date Taking? Authorizing Provider  apixaban (ELIQUIS) 5 MG TABS tablet Take 1 tablet (5 mg total) by mouth 2 (two) times daily. 09/22/18 09/22/19 Yes Turner, Traci R, MD  DILT-XR 180 MG 24 hr capsule TAKE 1 CAPSULE BY MOUTH EVERY DAY Patient taking differently: Take 180 mg by mouth daily.  09/05/18  Yes Turner, Eber Hong, MD  Polyvinyl Alcohol-Povidone (REFRESH OP) Apply 2 drops to eye 2 (two) times daily.    Yes [provider]    Physical Exam: Vitals:   09/22/18 1723 09/22/18 1949 09/22/18 2149  BP: (!) 165/80 (!) 154/83 (!) 149/87  Pulse: (!) 110 (!) 106 100  Resp: (!) 22 16 18   Temp: 98.3 F (36.8 C)  98 F (36.7 C)  TempSrc: Oral  Oral  SpO2: 99% 100% 100%  Weight:   44.4 kg  Height:   5\' 2"  (1.575 m)      Constitutional: Moderately built and nourished. Vitals:   09/22/18 1723 09/22/18 1949 09/22/18 2149  BP: (!) 165/80 (!) 154/83 (!) 149/87  Pulse: (!) 110 (!) 106 100  Resp: (!) 22 16 18   Temp: 98.3 F (36.8 C)  98 F (36.7 C)  TempSrc: Oral  Oral  SpO2: 99% 100% 100%  Weight:   44.4 kg  Height:   5\' 2"  (1.575 m)   Eyes: Anicteric no pallor. ENMT: No discharge from the ears eyes nose or  mouth. Neck: No mass felt.  No neck rigidity.  No JVD appreciated. Respiratory: No rhonchi or crepitations. Cardiovascular: S1-S2 heard no murmurs appreciated. Abdomen: Soft nontender bowel sounds present. Musculoskeletal: No edema.  No joint effusion. Skin: No rash.  Skin appears warm. Neurologic: Alert awake oriented to time place and person.  Moves all extremities. Psychiatric: Appears normal.  Normal affect.   Labs on Admission: I have personally reviewed following labs and imaging studies  CBC: Recent Labs  Lab 09/22/18 0912 09/22/18 1832  WBC 5.5 5.8  HGB 7.6* 8.3*  HCT 24.8* 28.0*  MCV 71* 73.1*  PLT 358 638   Basic Metabolic Panel: Recent Labs  Lab 09/22/18 1832  NA 142  K 3.8  CL 108  CO2 24  GLUCOSE 124*  BUN  22  CREATININE 0.77  CALCIUM 9.1   GFR: Estimated Creatinine Clearance: 39.3 mL/min (by C-G formula based on SCr of 0.77 mg/dL). Liver Function Tests: Recent Labs  Lab 09/22/18 1832  AST 22  ALT 17  ALKPHOS 93  BILITOT 0.4  PROT 7.5  ALBUMIN 4.4   No results for input(s): LIPASE, AMYLASE in the last 168 hours. No results for input(s): AMMONIA in the last 168 hours. Coagulation Profile: No results for input(s): INR, PROTIME in the last 168 hours. Cardiac Enzymes: No results for input(s): CKTOTAL, CKMB, CKMBINDEX, TROPONINI in the last 168 hours. BNP (last 3 results) No results for input(s): PROBNP in the last 8760 hours. HbA1C: No results for input(s): HGBA1C in the last 72 hours. CBG: No results for input(s): GLUCAP in the last 168 hours. Lipid Profile: No results for input(s): CHOL, HDL, LDLCALC, TRIG, CHOLHDL, LDLDIRECT in the last 72 hours. Thyroid Function Tests: No results for input(s): TSH, T4TOTAL, FREET4, T3FREE, THYROIDAB in the last 72 hours. Anemia Panel: No results for input(s): VITAMINB12, FOLATE, FERRITIN, TIBC, IRON, RETICCTPCT in the last 72 hours. Urine analysis:    Component Value Date/Time   COLORURINE COLORLESS (A) 09/22/2018 1832   APPEARANCEUR CLEAR 09/22/2018 1832   LABSPEC 1.004 (L) 09/22/2018 1832   PHURINE 6.0 09/22/2018 1832   GLUCOSEU NEGATIVE 09/22/2018 1832   HGBUR MODERATE (A) 09/22/2018 1832   BILIRUBINUR NEGATIVE 09/22/2018 1832   KETONESUR NEGATIVE 09/22/2018 1832   PROTEINUR NEGATIVE 09/22/2018 1832   NITRITE NEGATIVE 09/22/2018 1832   LEUKOCYTESUR NEGATIVE 09/22/2018 1832   Sepsis Labs: @LABRCNTIP (procalcitonin:4,lacticidven:4) )No results found for this or any previous visit (from the past 240 hour(s)).   Radiological Exams on Admission: Ct Abdomen Pelvis W Contrast  Result Date: 09/22/2018 CLINICAL DATA:  Anemia, lower GI bleed EXAM: CT ABDOMEN AND PELVIS WITH CONTRAST TECHNIQUE: Multidetector CT imaging of the abdomen and  pelvis was performed using the standard protocol following bolus administration of intravenous contrast. CONTRAST:  161mL ISOVUE-300 IOPAMIDOL (ISOVUE-300) INJECTION 61% COMPARISON:  None. FINDINGS: Lower chest: Basilar atelectasis and scarring. Pectus deformity of the anterior chest. Scoliosis noted. Normal heart size. No pericardial or pleural effusion. Hepatobiliary: 10 mm right hepatic dome hypodensity, suspect cyst. No other hepatic abnormality or biliary dilatation. Gallbladder collapsed. Common bile duct nondilated. Pancreas: Unremarkable. No pancreatic ductal dilatation or surrounding inflammatory changes. Spleen: Normal in size without focal abnormality. Adrenals/Urinary Tract: Adrenal glands unremarkable. Right kidney demonstrates a 4 mm punctate non-obstructing calculus in the upper pole, image 13 series 2. Scattered hypodense subcentimeter left renal cysts No renal obstruction, hydronephrosis, hydroureter obstructing ureteral calculus. Bladder unremarkable and under distended. Stomach/Bowel: Negative for bowel obstruction, significant dilatation, ileus, free air. No fluid collection, abscess,  ascites. Appendix in the right hemipelvis appears normal. Minor scattered colonic diverticulosis without acute inflammatory process. Vascular/Lymphatic: Aortic atherosclerosis. No occlusive process, aneurysm or dissection. Mesenteric and renal vasculature appear patent. No veno-occlusive process. No adenopathy. Benign-appearing right hemipelvis calcifications suspect right gonadal vein phleboliths. Reproductive: Remote hysterectomy. No adnexal abnormality, pelvic fluid or abscess. No pelvic hematoma. Other: No abdominal wall hernia or abnormality. No abdominopelvic ascites. Musculoskeletal: Degenerative changes and scoliosis of the spine. No acute osseous finding. IMPRESSION: No acute intra-abdominopelvic finding by CT. Nonobstructing right nephrolithiasis Atherosclerosis without aneurysm Diverticulosis without  acute inflammatory process or abscess Electronically Signed   By: Jerilynn Mages.  Shick M.D.   On: 09/22/2018 21:29    EKG: Independently reviewed.  Normal sinus rhythm.  RBBB.  Assessment/Plan Principal Problem:   GI bleed Active Problems:   Essential hypertension, benign   Persistent atrial fibrillation (HCC)   Symptomatic anemia   Acute GI bleeding   On apixaban therapy    1. GI bleed -suspect could be from hemorrhoids.  Has not had any colonoscopy previously as per the patient.  CT abdomen pelvis unremarkable.  ER physician had consulted Dr. Watt Climes on-call gastroenterologist who advised to hold off apixaban and check serial CBC.  If there is significant decline in hemoglobin may reconsult Dr. Watt Climes in the morning.  Otherwise follow-up as outpatient. 2. Microcytic hypochromic anemia -check anemia panel.  Follow CBC.  See #1. 3. Atrial fibrillation -rate controlled on Cardizem.  Holding apixaban due to GI bleed patient agreeable. 4. Hypertension on Cardizem. 5. Hyperlipidemia on diet.   DVT prophylaxis: SCDs due to GI bleed. Code Status: Full code. Family Communication: Discussed with patient. Disposition Plan: Home. Consults called: ER physician discussed with Dr. Watt Climes gastroenterologist. Admission status: Observation.   Rise Patience MD [facility] Pager [phone].  If 7PM-7AM, please contact night-coverage [email or web address] Password [email or web address]  09/22/2018, 11:15 PM

## 2018-09-22 NOTE — Patient Instructions (Signed)
Medication Instructions:  Your physician recommends that you continue on your current medications as directed. Please refer to the Current Medication list given to you today.   Labwork: Today: CBC  Follow-Up: Your physician wants you to follow-up in: 6 months with Dr. Turner. You will receive a reminder letter in the mail two months in advance. If you don't receive a letter, please call our office to schedule the follow-up appointment.  If you need a refill on your cardiac medications before your next appointment, please call your pharmacy.  

## 2018-09-22 NOTE — ED Notes (Signed)
Delay in transport d/t CT

## 2018-09-23 ENCOUNTER — Other Ambulatory Visit: Payer: Self-pay

## 2018-09-23 DIAGNOSIS — D508 Other iron deficiency anemias: Secondary | ICD-10-CM | POA: Diagnosis not present

## 2018-09-23 DIAGNOSIS — Z7901 Long term (current) use of anticoagulants: Secondary | ICD-10-CM

## 2018-09-23 DIAGNOSIS — I10 Essential (primary) hypertension: Secondary | ICD-10-CM | POA: Diagnosis not present

## 2018-09-23 DIAGNOSIS — K922 Gastrointestinal hemorrhage, unspecified: Secondary | ICD-10-CM

## 2018-09-23 LAB — CBC
HCT: 24.3 % — ABNORMAL LOW (ref 36.0–46.0)
HCT: 25.9 % — ABNORMAL LOW (ref 36.0–46.0)
Hemoglobin: 7.2 g/dL — ABNORMAL LOW (ref 12.0–15.0)
Hemoglobin: 7.8 g/dL — ABNORMAL LOW (ref 12.0–15.0)
MCH: 21.8 pg — ABNORMAL LOW (ref 26.0–34.0)
MCH: 21.9 pg — ABNORMAL LOW (ref 26.0–34.0)
MCHC: 29.6 g/dL — ABNORMAL LOW (ref 30.0–36.0)
MCHC: 30.1 g/dL (ref 30.0–36.0)
MCV: 72.5 fL — ABNORMAL LOW (ref 78.0–100.0)
MCV: 73.9 fL — ABNORMAL LOW (ref 78.0–100.0)
Platelets: 329 10*3/uL (ref 150–400)
Platelets: 358 10*3/uL (ref 150–400)
RBC: 3.29 MIL/uL — ABNORMAL LOW (ref 3.87–5.11)
RBC: 3.57 MIL/uL — ABNORMAL LOW (ref 3.87–5.11)
RDW: 15.7 % — ABNORMAL HIGH (ref 11.5–15.5)
RDW: 15.8 % — ABNORMAL HIGH (ref 11.5–15.5)
WBC: 6 10*3/uL (ref 4.0–10.5)
WBC: 6.2 10*3/uL (ref 4.0–10.5)

## 2018-09-23 LAB — BASIC METABOLIC PANEL
Anion gap: 8 (ref 5–15)
BUN: 16 mg/dL (ref 8–23)
CO2: 25 mmol/L (ref 22–32)
Calcium: 8.9 mg/dL (ref 8.9–10.3)
Chloride: 109 mmol/L (ref 98–111)
Creatinine, Ser: 0.72 mg/dL (ref 0.44–1.00)
GFR calc Af Amer: >60 mL/min (ref >60)
GFR calc non Af Amer: >60 mL/min (ref >60)
Glucose, Bld: 112 mg/dL — ABNORMAL HIGH (ref 70–99)
Potassium: 4 mmol/L (ref 3.5–5.1)
Sodium: 142 mmol/L (ref 135–145)

## 2018-09-23 LAB — FERRITIN: Ferritin: 3 ng/mL — ABNORMAL LOW (ref 11–307)

## 2018-09-23 LAB — VITAMIN B12: Vitamin B-12: 122 pg/mL — ABNORMAL LOW (ref 180–914)

## 2018-09-23 LAB — RETICULOCYTES
RBC.: 3.57 MIL/uL — ABNORMAL LOW (ref 3.87–5.11)
Retic Count, Absolute: 32.1 10*3/uL (ref 19.0–186.0)
Retic Ct Pct: 0.9 % (ref 0.4–3.1)

## 2018-09-23 LAB — FOLATE: Folate: 20.2 ng/mL (ref >5.9)

## 2018-09-23 LAB — IRON AND TIBC
Iron: 13 ug/dL — ABNORMAL LOW (ref 28–170)
Saturation Ratios: 3 % — ABNORMAL LOW (ref 10.4–31.8)
TIBC: 491 ug/dL — ABNORMAL HIGH (ref 250–450)
UIBC: 478 ug/dL

## 2018-09-23 LAB — PREPARE RBC (CROSSMATCH)

## 2018-09-23 MED ORDER — FERROUS SULFATE 325 (65 FE) MG PO TBEC: 325.0000 mg | DELAYED_RELEASE_TABLET | Freq: Two times a day (BID) | ORAL | 3 refills | Status: DC

## 2018-09-23 MED ORDER — VITAMIN C 250 MG PO TABS: 250.0000 mg | ORAL_TABLET | Freq: Every day | ORAL | Status: AC

## 2018-09-23 MED ORDER — PANTOPRAZOLE SODIUM 40 MG PO TBEC: 40.0000 mg | DELAYED_RELEASE_TABLET | Freq: Two times a day (BID) | ORAL | 1 refills | Status: DC

## 2018-09-23 MED ORDER — SODIUM CHLORIDE 0.9% IV SOLUTION: Freq: Once | INTRAVENOUS | Status: DC

## 2018-09-23 NOTE — Plan of Care (Signed)
Discharge instructions reviewed with patient and spouse, questions answered, verbalized understanding.  Patient transported to main entrance of hospital via wheelchair to be taken home by husband.

## 2018-09-23 NOTE — Discharge Summary (Signed)
Physician Discharge Summary  Kristen Cruz:[id number] DOB: 1936-07-20 DOA: 09/22/2018  PCP: Kristen Cruz, No Pcp Per  Admit date: 09/22/2018 Discharge date: 09/23/2018  Admitted From: Home Disposition: Home  Recommendations for Outpatient Follow-up:  1. Follow up with PCP in 1-2 weeks 2. Please obtain BMP/CBC in one week 3. Follow-up with Dr. Watt Climes GI  Home Health: None Equipment/Devices none  Discharge Condition: Stable CODE STATUS full code Diet recommendation cardiac Brief/Interim Summary: 82 y.o. female with history of persistent atrial fibrillation hypertension hyperlipidemia had visited her cardiologist today and had routine blood work done which showed a hemoglobin of around 7.3 which is a drop of 2 g from Kristen Cruz's baseline.  Kristen Cruz is on Eliquis and there was some concern for blood loss and was referred to the ER.  Kristen Cruz states Kristen Cruz has not noticed any obvious GI bleed like black stools or frank rectal bleeding or any vomiting.  Denies any abdominal pain or any weakness or shortness of breath or chest pain.  Kristen Cruz states she does have history of hemorrhoids.  ED Course: In the ER stool for occult blood was positive.  Hemoglobin is around 8.3.  ER physician discussed with on-call gastroenterologist Dr. Watt Climes who advised at this time to admit for observation and run serial CBC.  If Kristen Cruz does not have any significant decline in CBC and then follow-up with Dr. Watt Climes as outpatient.  Advised to reconsult in the morning.  Advised to hold off apixaban for now.   Discharge Diagnoses:  Principal Problem:   GI bleed Active Problems:   Essential hypertension, benign   Persistent atrial fibrillation (HCC)   Symptomatic anemia   Acute GI bleeding   On apixaban therapy  #1 GI bleed Kristen Cruz sent in from Dr. Theodosia Blender office due to low hemoglobin of 8.3.  Kristen Cruz reports her hemoglobin is usually around 10.  She was FOBT positive.  CT scan of the abdomen and pelvis was negative.   Kristen Cruz was asymptomatic during the whole hospital stay with no complaints of chest pain shortness of breath or lightheadedness.  She was very anxious to go home.  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  She works in [employer], applications.  Discussed with Dr. Watt Climes that Kristen Cruz will follow-up with him next week so he can follow-up on hemoglobin and he will discuss with the cardiologist about stopping apixaban prior to GI work-up.  Currently she is asymptomatic she had a bowel movement this morning prior to discharge which was brown in color with no evidence of bleeding.  She will be sent home on apixaban.  She will follow-up with her cardiologist as an outpatient.  She reported that she was told to be chronically anemic and iron deficient since the time she was having children.  I will also place her on iron and vitamin C.  #2 history of atrial fibrillation continue Cardizem and restart apixaban.  Discussed with the Kristen Cruz and she agrees with the plan in fact she was very anxious to restart apixaban.  #3 hypertension stable on Cardizem.  Discharge Instructions  Discharge Instructions    Call MD for:  persistant dizziness or light-headedness   Complete by:  As directed    Call MD for:  persistant nausea and vomiting   Complete by:  As directed    Diet - low sodium heart healthy   Complete by:  As directed    Increase activity slowly   Complete by:  As directed  Allergies as of 09/23/2018      Reactions   Asa [aspirin] Rash   Bentyl [dicyclomine Hcl] Rash   Sucrets [dyclonine] Rash   Sulfa Antibiotics Rash   Tetracycline Hcl Rash   Tylenol [acetaminophen] Rash      Medication List    TAKE these medications   apixaban 5 MG Tabs tablet Commonly known as:  ELIQUIS Take 1 tablet (5 mg total) by mouth 2 (two) times daily.   DILT-XR 180 MG 24 hr capsule Generic drug:  diltiazem TAKE 1 CAPSULE BY MOUTH EVERY DAY What changed:  how much to take   pantoprazole  40 MG tablet Commonly known as:  PROTONIX Take 1 tablet (40 mg total) by mouth 2 (two) times daily.   REFRESH OP Apply 2 drops to eye 2 (two) times daily.       Allergies  Allergen Reactions  . Asa [Aspirin] Rash  . Bentyl [Dicyclomine Hcl] Rash  . Sucrets [Dyclonine] Rash  . Sulfa Antibiotics Rash  . Tetracycline Hcl Rash  . Tylenol [Acetaminophen] Rash    Consultations:  Dr. Watt Climes over the phone   Procedures/Studies: Ct Abdomen Pelvis W Contrast  Result Date: 09/22/2018 CLINICAL DATA:  Anemia, lower GI bleed EXAM: CT ABDOMEN AND PELVIS WITH CONTRAST TECHNIQUE: Multidetector CT imaging of the abdomen and pelvis was performed using the standard protocol following bolus administration of intravenous contrast. CONTRAST:  196mL ISOVUE-300 IOPAMIDOL (ISOVUE-300) INJECTION 61% COMPARISON:  None. FINDINGS: Lower chest: Basilar atelectasis and scarring. Pectus deformity of the anterior chest. Scoliosis noted. Normal heart size. No pericardial or pleural effusion. Hepatobiliary: 10 mm right hepatic dome hypodensity, suspect cyst. No other hepatic abnormality or biliary dilatation. Gallbladder collapsed. Common bile duct nondilated. Pancreas: Unremarkable. No pancreatic ductal dilatation or surrounding inflammatory changes. Spleen: Normal in size without focal abnormality. Adrenals/Urinary Tract: Adrenal glands unremarkable. Right kidney demonstrates a 4 mm punctate non-obstructing calculus in the upper pole, image 13 series 2. Scattered hypodense subcentimeter left renal cysts No renal obstruction, hydronephrosis, hydroureter obstructing ureteral calculus. Bladder unremarkable and under distended. Stomach/Bowel: Negative for bowel obstruction, significant dilatation, ileus, free air. No fluid collection, abscess, ascites. Appendix in the right hemipelvis appears normal. Minor scattered colonic diverticulosis without acute inflammatory process. Vascular/Lymphatic: Aortic atherosclerosis. No  occlusive process, aneurysm or dissection. Mesenteric and renal vasculature appear patent. No veno-occlusive process. No adenopathy. Benign-appearing right hemipelvis calcifications suspect right gonadal vein phleboliths. Reproductive: Remote hysterectomy. No adnexal abnormality, pelvic fluid or abscess. No pelvic hematoma. Other: No abdominal wall hernia or abnormality. No abdominopelvic ascites. Musculoskeletal: Degenerative changes and scoliosis of the spine. No acute osseous finding. IMPRESSION: No acute intra-abdominopelvic finding by CT. Nonobstructing right nephrolithiasis Atherosclerosis without aneurysm Diverticulosis without acute inflammatory process or abscess Electronically Signed   By: Jerilynn Mages.  Shick M.D.   On: 09/22/2018 21:29    (Echo, Carotid, EGD, Colonoscopy, ERCP)    Subjective:   Discharge Exam: Vitals:   09/23/18 1154 09/23/18 1209  BP: 138/73 130/71  Pulse: 91 81  Resp: 18 18  Temp: 98.5 F (36.9 C) 98.2 F (36.8 C)  SpO2:  100%   Vitals:   09/23/18 0810 09/23/18 1115 09/23/18 1154 09/23/18 1209  BP:  (!) 142/72 138/73 130/71  Pulse:  85 91 81  Resp:  18 18 18   Temp:  98.2 F (36.8 C) 98.5 F (36.9 C) 98.2 F (36.8 C)  TempSrc:  Oral Oral Oral  SpO2: 100% 96%  100%  Weight:      Height:  General: Pt is alert, awake, not in acute distress Cardiovascular: RRR, S1/S2 +, no rubs, no gallops Respiratory: CTA bilaterally, no wheezing, no rhonchi Abdominal: Soft, NT, ND, bowel sounds + Extremities: no edema, no cyanosis    The results of significant diagnostics from this hospitalization (including imaging, microbiology, ancillary and laboratory) are listed below for reference.     Microbiology: No results found for this or any previous visit (from the past 240 hour(s)).   Labs: BNP (last 3 results) No results for input(s): BNP in the last 8760 hours. Basic Metabolic Panel: Recent Labs  Lab 09/22/18 1832 09/23/18 0314  NA 142 142  K 3.8 4.0   CL 108 109  CO2 24 25  GLUCOSE 124* 112*  BUN 22 16  CREATININE 0.77 0.72  CALCIUM 9.1 8.9   Liver Function Tests: Recent Labs  Lab 09/22/18 1832  AST 22  ALT 17  ALKPHOS 93  BILITOT 0.4  PROT 7.5  ALBUMIN 4.4   No results for input(s): LIPASE, AMYLASE in the last 168 hours. No results for input(s): AMMONIA in the last 168 hours. CBC: Recent Labs  Lab 09/22/18 0912 09/22/18 1832 09/22/18 2335 09/23/18 0314  WBC 5.5 5.8 6.2 6.0  HGB 7.6* 8.3* 7.8* 7.2*  HCT 24.8* 28.0* 25.9* 24.3*  MCV 71* 73.1* 72.5* 73.9*  PLT 358 386 358 329   Cardiac Enzymes: No results for input(s): CKTOTAL, CKMB, CKMBINDEX, TROPONINI in the last 168 hours. BNP: Invalid input(s): POCBNP CBG: No results for input(s): GLUCAP in the last 168 hours. D-Dimer No results for input(s): DDIMER in the last 72 hours. Hgb A1c No results for input(s): HGBA1C in the last 72 hours. Lipid Profile No results for input(s): CHOL, HDL, LDLCALC, TRIG, CHOLHDL, LDLDIRECT in the last 72 hours. Thyroid function studies No results for input(s): TSH, T4TOTAL, T3FREE, THYROIDAB in the last 72 hours.  Invalid input(s): FREET3 Anemia work up Recent Labs    09/22/18 2335  VITAMINB12 122*  FOLATE 20.2  FERRITIN 3*  TIBC 491*  IRON 13*  RETICCTPCT 0.9   Urinalysis    Component Value Date/Time   COLORURINE COLORLESS (A) 09/22/2018 1832   APPEARANCEUR CLEAR 09/22/2018 1832   LABSPEC 1.004 (L) 09/22/2018 1832   PHURINE 6.0 09/22/2018 1832   GLUCOSEU NEGATIVE 09/22/2018 1832   HGBUR MODERATE (A) 09/22/2018 1832   BILIRUBINUR NEGATIVE 09/22/2018 1832   KETONESUR NEGATIVE 09/22/2018 1832   PROTEINUR NEGATIVE 09/22/2018 1832   NITRITE NEGATIVE 09/22/2018 1832   LEUKOCYTESUR NEGATIVE 09/22/2018 1832   Sepsis Labs Invalid input(s): PROCALCITONIN,  WBC,  LACTICIDVEN Microbiology No results found for this or any previous visit (from the past 240 hour(s)).   Time coordinating discharge:  35  minutes  SIGNED:   Georgette Shell, MD  [facility] 09/23/2018, 1:13 PM Pager   If 7PM-7AM, please contact night-coverage [email or web address] Password [email or web address]

## 2018-09-25 LAB — TYPE AND SCREEN
ABO/RH(D): O POS
Antibody Screen: NEGATIVE
Unit division: 0

## 2018-09-25 LAB — BPAM RBC
Blood Product Expiration Date: 201911022359
ISSUE DATE / TIME: 201910051135
Unit Type and Rh: 5100

## 2018-12-15 ENCOUNTER — Ambulatory Visit (INDEPENDENT_AMBULATORY_CARE_PROVIDER_SITE_OTHER): Payer: Medicare Other | Admitting: Family Medicine

## 2018-12-15 ENCOUNTER — Encounter: Payer: Self-pay | Admitting: Family Medicine

## 2018-12-15 ENCOUNTER — Other Ambulatory Visit: Payer: Self-pay

## 2018-12-15 VITALS — BP 140/80 | HR 78 | Temp 98.5°F | Ht 60.0 in | Wt 95.1 lb

## 2018-12-15 DIAGNOSIS — E538 Deficiency of other specified B group vitamins: Secondary | ICD-10-CM | POA: Diagnosis not present

## 2018-12-15 DIAGNOSIS — I10 Essential (primary) hypertension: Secondary | ICD-10-CM

## 2018-12-15 DIAGNOSIS — I4819 Other persistent atrial fibrillation: Secondary | ICD-10-CM | POA: Diagnosis not present

## 2018-12-15 DIAGNOSIS — D649 Anemia, unspecified: Secondary | ICD-10-CM | POA: Diagnosis not present

## 2018-12-15 LAB — CBC WITH DIFFERENTIAL/PLATELET
Basophils Absolute: 0 10*3/uL (ref 0.0–0.1)
Basophils Relative: 0.6 % (ref 0.0–3.0)
Eosinophils Absolute: 0 10*3/uL (ref 0.0–0.7)
Eosinophils Relative: 0.3 % (ref 0.0–5.0)
HCT: 30.8 % — ABNORMAL LOW (ref 36.0–46.0)
Hemoglobin: 10.2 g/dL — ABNORMAL LOW (ref 12.0–15.0)
Lymphocytes Relative: 17.3 % (ref 12.0–46.0)
Lymphs Abs: 0.9 10*3/uL (ref 0.7–4.0)
MCHC: 33.2 g/dL (ref 30.0–36.0)
MCV: 85.8 fl (ref 78.0–100.0)
Monocytes Absolute: 0.5 10*3/uL (ref 0.1–1.0)
Monocytes Relative: 9.8 % (ref 3.0–12.0)
Neutro Abs: 3.8 10*3/uL (ref 1.4–7.7)
Neutrophils Relative %: 72 % (ref 43.0–77.0)
Platelets: 290 10*3/uL (ref 150.0–400.0)
RBC: 3.59 Mil/uL — ABNORMAL LOW (ref 3.87–5.11)
RDW: 17.2 % — ABNORMAL HIGH (ref 11.5–15.5)
WBC: 5.3 10*3/uL (ref 4.0–10.5)

## 2018-12-15 NOTE — Progress Notes (Signed)
Subjective:     Patient ID: Kristen Cruz, female   DOB: 01-30-1936, 82 y.o.   MRN: [id number]  HPI Patient is here to establish care.  She has history of atrial fibrillation and on Eliquis.  She apparently had hemoglobin down in the low 7 range this past fall and was admitted.  She is followed by GI and had Hemoccult cards which were reportedly positive.  She has been scheduled for colonoscopy and EGD in early February.  She states her last hemoglobin was 9.8.  She also had B12 level of 112 during her hospitalization but she had no knowledge of that.  She does not take any B12 replacement.  She has A. fib history and is on diltiazem and also Eliquis 5 mg twice daily.  No history of hypertension.  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  She has both ovaries in place.  Never smoked.  Still works but plans to retire in January.  She works at [employer].  She states she has not had bone density scan and she declines mammograms.  She walks regularly for exercise.  No recent falls.  Appetite and weight stable  Past Medical History:  Diagnosis Date  . Hypertension   . Persistent atrial fibrillation    CHADS2VASC score is 4 on Apixaban   Past Surgical History:  Procedure Laterality Date  . ABDOMINAL HYSTERECTOMY  1978    reports that she has never smoked. She has never used smokeless tobacco. She reports that she does not drink alcohol or use drugs. family history includes Heart Problems in her mother; Hypertension in her mother. Allergies  Allergen Reactions  . Asa [Aspirin] Rash  . Bentyl [Dicyclomine Hcl] Rash  . Sucrets [Dyclonine] Rash  . Sulfa Antibiotics Rash  . Tetracycline Hcl Rash  . Tylenol [Acetaminophen] Rash     Review of Systems  Constitutional: Negative for appetite change, fatigue, fever and unexpected weight change.  Eyes: Negative for visual disturbance.  Respiratory: Negative for cough, chest tightness, shortness of breath and wheezing.    Cardiovascular: Negative for chest pain, palpitations and leg swelling.  Gastrointestinal: Negative for abdominal pain and blood in stool.  Endocrine: Negative for polydipsia and polyuria.  Genitourinary: Negative for dysuria.  Neurological: Negative for dizziness, seizures, syncope, weakness, light-headedness and headaches.       Objective:   Physical Exam Constitutional:      Appearance: Normal appearance.  Neck:     Musculoskeletal: Normal range of motion and neck supple.  Cardiovascular:     Rate and Rhythm: Normal rate and regular rhythm.  Pulmonary:     Effort: Pulmonary effort is normal.     Breath sounds: Normal breath sounds.  Musculoskeletal:     Right lower leg: No edema.     Left lower leg: No edema.  Lymphadenopathy:     Cervical: No cervical adenopathy.  Neurological:     Mental Status: She is alert.        Assessment:     #1 history of reported chronic atrial fibrillation followed by cardiology.  She appears to be in sinus rhythm today.  She is on chronic anticoagulation  #2 history of reported iron deficiency anemia.  She is followed by GI with planned EGD and colonoscopy in February  #3 B12 deficiency from labs from hospitalization back in October.  She is currently not on replacement  #4 hypertension- stable.      Plan:     -We gave her option of  intramuscular replacement B12 versus oral and she elects the latter.  Start B12 thousand micrograms once daily and follow-up in 3 months and recheck B12 level then -Repeat CBC today -Continue close follow-up with GI as scheduled -Recommended flu vaccine and she declines -We discussed several other health maintenance issues including mammography and bone density scan and she declines both  Eulas Post MD [facility]

## 2018-12-15 NOTE — Patient Instructions (Signed)
Start over the counter B12 1,000 mcg one daily.  Let's plan on 3 month follow up and will repeat B12 level then.

## 2018-12-17 DIAGNOSIS — E538 Deficiency of other specified B group vitamins: Secondary | ICD-10-CM | POA: Insufficient documentation

## 2019-01-24 ENCOUNTER — Telehealth: Payer: Self-pay

## 2019-01-24 NOTE — Telephone Encounter (Signed)
Lab result note states that she received a call to go over her lab results. Will call patient as soon as I can.  Copied from [place] [phone]. Topic: General - Call Back - No Documentation >> Jan 24, 2019  9:04 AM Vernona Rieger wrote: Reason for CRM: Patient said she missed a call from the office and does not know why. Please advise

## 2019-01-31 ENCOUNTER — Ambulatory Visit: Payer: Self-pay | Admitting: Surgery

## 2019-01-31 ENCOUNTER — Telehealth: Payer: Self-pay | Admitting: *Deleted

## 2019-01-31 NOTE — Telephone Encounter (Signed)
    [facility] Pre-operative Risk Assessment    Request for surgical clearance:  1. What type of surgery is being performed? 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 When is this surgery scheduled? 02/06/19   3. What type of clearance is required (medical clearance vs. Pharmacy clearance to hold med vs. Both)? BOTH  4. Are there any medications that need to be held prior to surgery and how long?ELIQUIS HOLD 3 DAYS PRIOR   5. Practice name and name of physician performing surgery? [facility]; DR. Sherren Mocha GERKIN  6. What is your office phone number [phone]    7.   What is your office fax number [phone]  8.   Anesthesia type (None, local, MAC, general) ? GENERAL    Kristen Cruz 01/31/2019, 2:21 PM  _________________________________________________________________   (provider comments below)

## 2019-01-31 NOTE — Telephone Encounter (Signed)
Patient with diagnosis of Afib on Eliquis for anticoagulation.    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

## 2019-02-01 ENCOUNTER — Encounter (HOSPITAL_COMMUNITY): Payer: Self-pay | Admitting: *Deleted

## 2019-02-01 ENCOUNTER — Encounter (HOSPITAL_COMMUNITY): Payer: Self-pay

## 2019-02-01 ENCOUNTER — Encounter (HOSPITAL_COMMUNITY)
Admission: RE | Admit: 2019-02-01 | Discharge: 2019-02-01 | Disposition: A | Discharge status: Home / Self Care | Payer: Medicare Other | Source: Ambulatory Visit | Attending: Surgery | Admitting: Surgery

## 2019-02-01 ENCOUNTER — Other Ambulatory Visit: Payer: Self-pay

## 2019-02-01 DIAGNOSIS — Z01812 Encounter for preprocedural laboratory examination: Secondary | ICD-10-CM | POA: Diagnosis not present

## 2019-02-01 HISTORY — DX: Other specified diseases of intestine: K63.89

## 2019-02-01 HISTORY — DX: Personal history of other diseases of the digestive system: Z87.19

## 2019-02-01 HISTORY — DX: Calculus of kidney: N20.0

## 2019-02-01 HISTORY — DX: Rash and other nonspecific skin eruption: R21

## 2019-02-01 HISTORY — DX: Iron deficiency anemia, unspecified: D50.9

## 2019-02-01 HISTORY — DX: Long term (current) use of anticoagulants: Z79.01

## 2019-02-01 LAB — CBC
HCT: 32.2 % — ABNORMAL LOW (ref 36.0–46.0)
Hemoglobin: 9.7 g/dL — ABNORMAL LOW (ref 12.0–15.0)
MCH: 28 pg (ref 26.0–34.0)
MCHC: 30.1 g/dL (ref 30.0–36.0)
MCV: 92.8 fL (ref 80.0–100.0)
Platelets: 314 10*3/uL (ref 150–400)
RBC: 3.47 MIL/uL — ABNORMAL LOW (ref 3.87–5.11)
RDW: 13.6 % (ref 11.5–15.5)
WBC: 6.5 10*3/uL (ref 4.0–10.5)
nRBC: 0 % (ref 0.0–0.2)

## 2019-02-01 MED ORDER — CHLORHEXIDINE GLUCONATE CLOTH 2 % EX PADS
6.0000 | MEDICATED_PAD | Freq: Once | CUTANEOUS | Status: DC
  Filled 2019-02-01: qty 6

## 2019-02-01 NOTE — Patient Instructions (Addendum)
Channing Mutters MISAO FACKRELL  02/01/2019       Your procedure is scheduled on:   02-06-2019    Report to [facility] Main  Entrance,  Report to admitting at  4:30 AM    Call this number if you have problems the morning of surgery [phone]                Special Instructions:  Follow Dr. Harlow Asa bowel instructions given to you by office.     Remember: DRINK 2 PRESURGERY ENSURE DRINKS THE NIGHT BEFORE SURGERY AT  10:00 PM.  ONLY CLEAR LIQUID DIET FROM  MIDNIGHT THE DAY PRIOR TO THE SURGERY THREE HOURS PRIOR TO SCHEDULED SURGERY 4:30 AM.  PLEASE FINISH PRESURGERY ENSURE DRINK PER SURGEON ORDER 3 HOURS PRIOR TO SCHEDULED SURGERY TIME WHICH NEEDS TO BE COMPLETED AT 4:30 AM.  NOTHING BY MOUTH AFTER 4:30 AM INCLUDING WATER, CANDY, GUM, MINTS.    BRUSH YOUR TEETH MORNING OF SURGERY AND RINSE YOUR MOUTH OUT.        Take these medicines the morning of surgery with A SIP OF WATER:  Diltiazem (Dilt-XR)                                   You may not have any metal on your body including hair pins and              piercings  Do not wear jewelry, make-up, lotions, powders or perfumes, deodorant             Do not wear nail polish.  Do not shave  48 hours prior to surgery.                   Do not bring valuables to the hospital. [place].  Contacts, dentures or bridgework may not be worn into surgery.  Leave suitcase in the car. After surgery it may be brought to your room.    _____________________________________________________________________     CLEAR LIQUID DIET   Foods Allowed                                                                     Foods Excluded  Coffee and tea, regular and decaf                             liquids that you cannot  Plain Jell-O in any flavor                                             see through such as: Fruit ices (not with fruit pulp)                                      milk,  soups, orange juice  Iced Popsicles                                    All solid food Carbonated beverages, regular and diet                                    Cranberry, grape and apple juices Sports drinks like Gatorade Lightly seasoned clear broth or consume(fat free) Sugar, honey syrup    _____________________________________________________________________    [facility] - Preparing for Surgery Before surgery, you can play an important role.  Because skin is not sterile, your skin needs to be as free of germs as possible.  You can reduce the number of germs on your skin by washing with CHG (chlorahexidine gluconate) soap before surgery.  CHG is an antiseptic cleaner which kills germs and bonds with the skin to continue killing germs even after washing. Please DO NOT use if you have an allergy to CHG or antibacterial soaps.  If your skin becomes reddened/irritated stop using the CHG and inform your nurse when you arrive at Short Stay. Do not shave (including legs and underarms) for at least 48 hours prior to the first CHG shower.  You may shave your face/neck. Please follow these instructions carefully:  1.  Shower with CHG Soap the night before surgery and the  morning of Surgery.  2.  If you choose to wash your hair, wash your hair first as usual with your  normal  shampoo.  3.  After you shampoo, rinse your hair and body thoroughly to remove the  shampoo.                            4.  Use CHG as you would any other liquid soap.  You can apply chg directly  to the skin and wash                       Gently with a scrungie or clean washcloth.  5.  Apply the CHG Soap to your body ONLY FROM THE NECK DOWN.   Do not use on face/ open                           Wound or open sores. Avoid contact with eyes, ears mouth and genitals (private parts).                       Wash face,  Genitals (private parts) with your normal soap.             6.  Wash thoroughly, paying special  attention to the area where your surgery  will be performed.  7.  Thoroughly rinse your body with warm water from the neck down.  8.  DO NOT shower/wash with your normal soap after using and rinsing off  the CHG Soap.             9.  Pat yourself dry with a clean towel.            10.  Wear clean pajamas.            11.  Place clean sheets on your bed the night of your first shower  and do not  sleep with pets. Day of Surgery : Do not apply any lotions/deodorants the morning of surgery.  Please wear clean clothes to the hospital/surgery center.  FAILURE TO FOLLOW THESE INSTRUCTIONS MAY RESULT IN THE CANCELLATION OF YOUR SURGERY PATIENT SIGNATURE_________________________________  NURSE SIGNATURE__________________________________  ________________________________________________________________________   Adam Phenix  An incentive spirometer is a tool that can help keep your lungs clear and active. This tool measures how well you are filling your lungs with each breath. Taking long deep breaths may help reverse or decrease the chance of developing breathing (pulmonary) problems (especially infection) following:  A long period of time when you are unable to move or be active. BEFORE THE PROCEDURE   If the spirometer includes an indicator to show your best effort, your nurse or respiratory therapist will set it to a desired goal.  If possible, sit up straight or lean slightly forward. Try not to slouch.  Hold the incentive spirometer in an upright position. INSTRUCTIONS FOR USE  1. Sit on the edge of your bed if possible, or sit up as far as you can in bed or on a chair. 2. Hold the incentive spirometer in an upright position. 3. Breathe out normally. 4. Place the mouthpiece in your mouth and seal your lips tightly around it. 5. Breathe in slowly and as deeply as possible, raising the piston or the ball toward the top of the column. 6. Hold your breath for 3-5 seconds or for as  long as possible. Allow the piston or ball to fall to the bottom of the column. 7. Remove the mouthpiece from your mouth and breathe out normally. 8. Rest for a few seconds and repeat Steps 1 through 7 at least 10 times every 1-2 hours when you are awake. Take your time and take a few normal breaths between deep breaths. 9. The spirometer may include an indicator to show your best effort. Use the indicator as a goal to work toward during each repetition. 10. After each set of 10 deep breaths, practice coughing to be sure your lungs are clear. If you have an incision (the cut made at the time of surgery), support your incision when coughing by placing a pillow or rolled up towels firmly against it. Once you are able to get out of bed, walk around indoors and cough well. You may stop using the incentive spirometer when instructed by your caregiver.  RISKS AND COMPLICATIONS  Take your time so you do not get dizzy or light-headed.  If you are in pain, you may need to take or ask for pain medication before doing incentive spirometry. It is harder to take a deep breath if you are having pain. AFTER USE  Rest and breathe slowly and easily.  It can be helpful to keep track of a log of your progress. Your caregiver can provide you with a simple table to help with this. If you are using the spirometer at home, follow these instructions: [place] IF:   You are having difficultly using the spirometer.  You have trouble using the spirometer as often as instructed.  Your pain medication is not giving enough relief while using the spirometer.  You develop fever of 100.5 F (38.1 C) or higher. SEEK IMMEDIATE MEDICAL CARE IF:   You cough up bloody sputum that had not been present before.  You develop fever of 102 F (38.9 C) or greater.  You develop worsening pain at or near the incision site. MAKE SURE  YOU:   Understand these instructions.  Will watch your condition.  Will get help  right away if you are not doing well or get worse. Document Released: 04/18/2007 Document Revised: 02/28/2012 Document Reviewed: 06/19/2007 [facility] Patient Information 2014 [place], [place].   ________________________________________________________________________

## 2019-02-01 NOTE — Telephone Encounter (Signed)
Left message for the patient to call back and speak to the on call preop APP 

## 2019-02-01 NOTE — Telephone Encounter (Signed)
   Primary Cardiologist: Fransico Him, MD  Chart reviewed as part of pre-operative protocol coverage. Patient was contacted 02/01/2019 in reference to pre-operative risk assessment for pending surgery as outlined below.  Kristen Cruz was last seen on 09/22/2018 by Dr. Radford Pax.  Since that day, Kristen Cruz has done well from cardiac perspective and denies any exertional chest pain or shortness of breath. She just retired from work on Monday, and prior that, she was walking 7 flights of stairs at work. She has no issue completing 4 METS of activity. Her RCRI risk is Class I which is 0.9% risk of major cardiac event.  Therefore, based on ACC/AHA guidelines, the patient would be at acceptable risk for the planned procedure without further cardiovascular testing.   I will route this recommendation to the requesting party via Epic fax function and remove from pre-op pool.  Please call with questions. Per our clinical pharmacist, patient can hold Eliquis for 3 days prior to procedure.    [place], [place] 02/01/2019, 4:12 PM

## 2019-02-01 NOTE — Progress Notes (Signed)
EKG dated 09-22-2018 in epic. Abd CT dated 09-22-2018 in epic.  Cardiology, dr Radford Pax, lov note dated 01-02-2019 in epic.  Telephone cardiac clearance dated 02-01-2019 in epic.

## 2019-02-02 LAB — HEMOGLOBIN A1C
Hgb A1c MFr Bld: 5 % (ref 4.8–5.6)
Mean Plasma Glucose: 97 mg/dL

## 2019-02-02 NOTE — Anesthesia Preprocedure Evaluation (Addendum)
Anesthesia Evaluation  Patient identified by MRN, date of birth, ID band Patient awake    Reviewed: Allergy & Precautions, NPO status , Patient's Chart, lab work & pertinent test results  Airway Mallampati: II  TM Distance: >3 FB Neck ROM: Full    Dental  (+) Edentulous Upper, Edentulous Lower   Pulmonary    breath sounds clear to auscultation       Cardiovascular hypertension,  Rhythm:Irregular Rate:Normal     Neuro/Psych    GI/Hepatic   Endo/Other    Renal/GU      Musculoskeletal   Abdominal   Peds  Hematology   Anesthesia Other Findings   Reproductive/Obstetrics                            Anesthesia Physical Anesthesia Plan  ASA: III  Anesthesia Plan: General   Post-op Pain Management:    Induction: Intravenous  PONV Risk Score and Plan: Ondansetron and Dexamethasone  Airway Management Planned: Oral ETT  Additional Equipment:   Intra-op Plan:   Post-operative Plan: Extubation in OR  Informed Consent: I have reviewed the patients History and Physical, chart, labs and discussed the procedure including the risks, benefits and alternatives for the proposed anesthesia with the patient or authorized representative who has indicated his/her understanding and acceptance.     Dental advisory given  Plan Discussed with:   Anesthesia Plan Comments: (See PST note 02/01/19, Konrad Felix, PA-C  Chronic Afib on Eliquis held since 2/14 Anemia PONV  Plan GA with oral ETT  Roberts Gaudy)      Anesthesia Quick Evaluation

## 2019-02-02 NOTE — Progress Notes (Signed)
Anesthesia Chart Review   Case:  [id number] Date/Time:  02/06/19 0715   Procedure:  OPEN RIGHT COLECTOMY (Right )   Anesthesia type:  General   Pre-op diagnosis:  CECAL MASS   Location:  [place] [facility]   Surgeon:  Armandina Gemma, MD      DISCUSSION: 83 yo never smoker with h/o PONV, A-fib (on Eliquis), anemia, HTN, cecal mass scheduled for above surgery 02/06/19 with Dr. Armandina Gemma.   Cardiac clearance received 02/01/19.  Per [place], PA-C, " Kristen Cruz was last seen on 09/22/2018 by Dr. Radford Pax.  Since that day, Kristen Cruz has done well from cardiac perspective and denies any exertional chest pain or shortness of breath. She just retired from work on Monday, and prior that, she was walking 7 flights of stairs at work. She has no issue completing 4 METS of activity. Her RCRI risk is Class I which is 0.9% risk of major cardiac event.  Therefore, based on ACC/AHA guidelines, the patient would be at acceptable risk for the planned procedure without further cardiovascular testing. Please call with questions. Per our clinical pharmacist, patient can holdEliquisfor 3days prior to procedure."  Pt can proceed with planned procedure barring acute status change.  VS: BP (!) 148/61 (BP Location: Right Arm)   Pulse 85   Temp 36.8 C (Oral)   Resp 18   Ht 5\' 2"  (1.575 m)   Wt 44.2 kg   SpO2 99%   BMI 17.83 kg/m   PROVIDERS: Eulas Post, MD is PCP   Fransico Him, MD is Cardiologist  LABS: Labs reviewed: Acceptable for surgery. (all labs ordered are listed, but only abnormal results are displayed)  Labs Reviewed  CBC - Abnormal; Notable for the following components:      Result Value   RBC 3.47 (*)    Hemoglobin 9.7 (*)    HCT 32.2 (*)    All other components within normal limits  HEMOGLOBIN A1C  TYPE AND SCREEN     IMAGES: CT Abdomen Pelvis 09/22/18 IMPRESSION: No acute intra-abdominopelvic finding by CT.  Nonobstructing right  nephrolithiasis  Atherosclerosis without aneurysm  Diverticulosis without acute inflammatory process or abscess  EKG: 09/22/18 Rate 88 bpm Sinus rhythm Probable left atrial enlargement Borderline left axis deviation Abnormal R-wave progression, early transition  CV: Echo 03/08/14 Study Conclusions  Left ventricle: The cavity size was normal. Systolic function was normal. The estimated ejection fraction was in the range of 55% to 65%. Wall motion was normal; there were no regional wall motion abnormalities. Past Medical History:  Diagnosis Date  . Anticoagulant long-term use    eliquis  . Arthritis   . Cecum mass   . History of GI bleed 09/22/2018   upper  . HOH (hard of hearing)   . Hypertension   . IDA (iron deficiency anemia)   . Persistent atrial fibrillation cardiologist-- dr Radford Pax   CHADS2VASC score is 4 on Apixaban  . PONV (postoperative nausea and vomiting)   . Rash    "allergic to my on body tempurature , if I have fever I break out in a rash"  . Right nephrolithiasis    per CT 09-22-2018  . Wears dentures    upper  . Wears glasses     Past Surgical History:  Procedure Laterality Date  . ABDOMINAL HYSTERECTOMY  1978   ovaries remain  . BREAST CYST EXCISION Right yrs ago   benign  . CATARACT EXTRACTION W/ INTRAOCULAR LENS  IMPLANT, BILATERAL  2014  approx.  . COLONOSCOPY  01-22-2019   dr Watt Climes    MEDICATIONS: . apixaban (ELIQUIS) 5 MG TABS tablet  . DILT-XR 180 MG 24 hr capsule  . ferrous sulfate 325 (65 FE) MG EC tablet  . GAVILYTE-N WITH FLAVOR PACK 420 g solution  . Polyvinyl Alcohol-Povidone (REFRESH OP)  . vitamin B-12 (CYANOCOBALAMIN) 1000 MCG tablet  . vitamin C (ASCORBIC ACID) 250 MG tablet   No current facility-administered medications for this encounter.     Maia Plan [facility] Pre-Surgical Testing [phone] 02/02/19 10:54 AM

## 2019-02-05 ENCOUNTER — Encounter (HOSPITAL_COMMUNITY): Payer: Self-pay | Admitting: Surgery

## 2019-02-05 DIAGNOSIS — K6389 Other specified diseases of intestine: Secondary | ICD-10-CM | POA: Diagnosis present

## 2019-02-05 NOTE — H&P (Signed)
General Surgery [facility]  [name] Documented: 01/31/2019 8:33 AM Location: [facility] Patient #: [id number] DOB: Jul 05, 1936 Undefined / Language: Cleophus Molt / Race: White Female   History of Present Illness Earnstine Regal MD; 01/31/2019 9:02 AM) The patient is a 83 year old female who presents with a colonic mass.  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 Clarene Essex for surgical evaluation and management of a near obstructing cecal mass. Patient has a history of anemia. She is on anticoagulation for atrial fibrillation. She had been hospitalized in October 2019 with anemia. Part of her evaluation included an upper and lower endoscopy which was performed last week. Patient was found to have a mass in the cecum which was worrisome for neoplasm and felt to be near obstructing. There was evidence of recent bleeding. Most recent hemoglobin was 9.3. Patient is now referred to surgery for consideration for right colectomy for management of cecal mass. Pathology from colonoscopy is pending. We have contacted the office with the gastroenterologist and will obtain a copy when it is available. Patient has had no prior gastrointestinal tumors. This was her first colonoscopy. Her only prior abdominal surgery was abdominal hysterectomy many years ago. There is no family history of colon cancer. Patient presents today accompanied by her husband to discuss resection of this near obstructing tumor in the very near future.   Past Surgical History Mammie Lorenzo, LPN; 7/56/4332 9:51 AM) Hysterectomy (not due to cancer) - Partial   Medication History Mammie Lorenzo, LPN; 8/84/1660 6:30 AM) Dilt-XR (180MG  Capsule ER 24HR, Oral) Active. Eliquis (5MG  Tablet, Oral) Active. Ferrous Sulfate (325 (65 Fe)MG Tablet DR, Oral) Active. Refresh (1% Solution, Ophthalmic) Active. Vitamin B Complex (Oral) Active. Vitamin C (500MG   Capsule, Oral) Active. Medications Reconciled  Family History Mammie Lorenzo, LPN; 1/60/1093 2:35 AM) Colon Cancer  Brother. Hypertension  Mother.  Pregnancy / Birth History Mammie Lorenzo, LPN; 5/73/2202 5:42 AM) Age at menarche  46 years. Age of menopause  100-60 Gravida  3  Other Problems Mammie Lorenzo, LPN; 06/25/2375 2:83 AM) Arthritis  Cholelithiasis  Hemorrhoids  Kidney Stone     Review of Systems Mammie Lorenzo LPN; 1/51/7616 0:73 AM) General Not Present- Appetite Loss, Chills, Fatigue, Fever, Night Sweats, Weight Gain and Weight Loss. HEENT Not Present- Earache, Hearing Loss, Hoarseness, Nose Bleed, Oral Ulcers, Ringing in the Ears, Seasonal Allergies, Sinus Pain, Sore Throat, Visual Disturbances, Wears glasses/contact lenses and Yellow Eyes. Breast Not Present- Breast Mass, Breast Pain, Nipple Discharge and Skin Changes. Cardiovascular Present- Palpitations. Not Present- Chest Pain, Difficulty Breathing Lying Down, Leg Cramps, Rapid Heart Rate, Shortness of Breath and Swelling of Extremities. Gastrointestinal Present- Hemorrhoids. Not Present- Abdominal Pain, Bloating, Bloody Stool, Change in Bowel Habits, Chronic diarrhea, Constipation, Difficulty Swallowing, Excessive gas, Gets full quickly at meals, Indigestion, Nausea, Rectal Pain and Vomiting. Female Genitourinary Not Present- Frequency, Nocturia, Painful Urination, Pelvic Pain and Urgency. Musculoskeletal Not Present- Back Pain, Joint Pain, Joint Stiffness, Muscle Pain, Muscle Weakness and Swelling of Extremities. Neurological Not Present- Decreased Memory, Fainting, Headaches, Numbness, Seizures, Tingling, Tremor, Trouble walking and Weakness. Psychiatric Not Present- Anxiety, Bipolar, Change in Sleep Pattern, Depression, Fearful and Frequent crying. Endocrine Not Present- Cold Intolerance, Excessive Hunger, Hair Changes, Heat Intolerance, Hot flashes and New Diabetes.  Vitals Claiborne Billings Dockery LPN; 06/28/6268  4:85 AM) 01/31/2019 8:33 AM Weight: 96.6 lb Height: 62in Body Surface Area: 1.4 m Body Mass Index: 17.67 kg/m  Temp.: 97.5F(Temporal)  Pulse: 74 (Regular)  BP: 132/64 (Sitting, Right Arm, Standard)       Physical Exam Earnstine Regal MD; 01/31/2019 9:03 AM) The physical exam findings are as follows: Note:See vital signs recorded above  GENERAL APPEARANCE Development: normal Nutritional status: normal Gross deformities: none  SKIN Rash, lesions, ulcers: none Induration, erythema: none Nodules: none palpable  EYES Conjunctiva and lids: normal Pupils: equal and reactive Iris: normal bilaterally  EARS, NOSE, MOUTH, THROAT External ears: no lesion or deformity External nose: no lesion or deformity Hearing: grossly normal Lips: no lesion or deformity Dentition: normal for age Oral mucosa: moist  NECK Symmetric: yes Trachea: midline Thyroid: no palpable nodules in the thyroid bed  CHEST Respiratory effort: normal Retraction or accessory muscle use: no Breath sounds: normal bilaterally Rales, rhonchi, wheeze: none  CARDIOVASCULAR Auscultation: regular rhythm, normal rate Murmurs: none Pulses: carotid and radial pulse 2+ palpable Lower extremity edema: none Lower extremity varicosities: none  ABDOMEN Distension: none Masses: none palpable Tenderness: none Hepatosplenomegaly: not present Hernia: not present  MUSCULOSKELETAL Station and gait: normal Digits and nails: no clubbing or cyanosis Muscle strength: grossly normal all extremities Range of motion: grossly normal all extremities Deformity: none  LYMPHATIC Cervical: none palpable Supraclavicular: none palpable  PSYCHIATRIC Oriented to person, place, and time: yes Mood and affect: normal for situation Judgment and insight: appropriate for situation    Assessment & Plan Earnstine Regal MD; 01/31/2019 9:06 AM) 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 This has also been associated with blood loss and anemia. Patient is accompanied by her husband. They are provided with written literature on colon surgery to review at home.  Patient has a near obstructing mass in the cecum. There was evidence of bleeding. Patient is on anticoagulation chronically for atrial fibrillation. She had been admitted in the fall of 2019 with significant anemia. Her most recent hemoglobin level was 9.3.  Mass in the cecum is suspected to be malignant. Biopsies at the time of colonoscopy are still pending. Regardless, the mass needs to be resected due to its near obstructing nature and to ongoing blood loss. We discussed performing right colectomy. We discussed the surgical approach which I have recommended be done as an open technique through a transverse abdominal incision due to the patient's small body habitus. We discussed the hospital stay to be anticipated. Patient and her husband understand and wish to proceed with surgery in the near future.  We will obtain cardiology clearance prior to surgery. Patient will also be evaluated by anesthesiology.  The risks and benefits of the procedure have been discussed at length with the patient. The patient understands the proposed procedure, potential alternative treatments, and the course of recovery to be expected. All of the patient's questions have been answered at this time. The patient wishes to proceed with surgery.   Armandina Gemma, [place] [facility] Office: [phone]

## 2019-02-06 ENCOUNTER — Encounter (HOSPITAL_COMMUNITY)
Admission: RE | Disposition: A | Discharge status: Home / Self Care | Payer: Self-pay | Source: Home / Self Care | Attending: Surgery

## 2019-02-06 ENCOUNTER — Other Ambulatory Visit: Payer: Self-pay

## 2019-02-06 ENCOUNTER — Encounter (HOSPITAL_COMMUNITY): Payer: Self-pay | Admitting: *Deleted

## 2019-02-06 ENCOUNTER — Inpatient Hospital Stay (HOSPITAL_COMMUNITY)
Admission: RE | Admit: 2019-02-06 | Discharge: 2019-02-09 | DRG: 330 | Disposition: A | Discharge status: Home / Self Care | Payer: Medicare Other | Attending: Surgery | Admitting: Surgery

## 2019-02-06 ENCOUNTER — Inpatient Hospital Stay (HOSPITAL_COMMUNITY): Payer: Medicare Other | Admitting: Physician Assistant

## 2019-02-06 ENCOUNTER — Telehealth: Payer: Self-pay | Admitting: Cardiology

## 2019-02-06 ENCOUNTER — Inpatient Hospital Stay (HOSPITAL_COMMUNITY): Payer: Medicare Other | Admitting: Anesthesiology

## 2019-02-06 DIAGNOSIS — M1990 Unspecified osteoarthritis, unspecified site: Secondary | ICD-10-CM | POA: Diagnosis present

## 2019-02-06 DIAGNOSIS — Z886 Allergy status to analgesic agent status: Secondary | ICD-10-CM

## 2019-02-06 DIAGNOSIS — Z882 Allergy status to sulfonamides status: Secondary | ICD-10-CM

## 2019-02-06 DIAGNOSIS — K6389 Other specified diseases of intestine: Secondary | ICD-10-CM | POA: Diagnosis present

## 2019-02-06 DIAGNOSIS — C779 Secondary and unspecified malignant neoplasm of lymph node, unspecified: Secondary | ICD-10-CM | POA: Diagnosis present

## 2019-02-06 DIAGNOSIS — K922 Gastrointestinal hemorrhage, unspecified: Secondary | ICD-10-CM | POA: Diagnosis present

## 2019-02-06 DIAGNOSIS — C189 Malignant neoplasm of colon, unspecified: Secondary | ICD-10-CM | POA: Diagnosis not present

## 2019-02-06 DIAGNOSIS — I4819 Other persistent atrial fibrillation: Secondary | ICD-10-CM | POA: Diagnosis present

## 2019-02-06 DIAGNOSIS — D500 Iron deficiency anemia secondary to blood loss (chronic): Secondary | ICD-10-CM | POA: Diagnosis not present

## 2019-02-06 DIAGNOSIS — Z7901 Long term (current) use of anticoagulants: Secondary | ICD-10-CM | POA: Diagnosis not present

## 2019-02-06 DIAGNOSIS — I480 Paroxysmal atrial fibrillation: Secondary | ICD-10-CM | POA: Diagnosis not present

## 2019-02-06 DIAGNOSIS — Z881 Allergy status to other antibiotic agents status: Secondary | ICD-10-CM

## 2019-02-06 DIAGNOSIS — R001 Bradycardia, unspecified: Secondary | ICD-10-CM | POA: Diagnosis not present

## 2019-02-06 DIAGNOSIS — K660 Peritoneal adhesions (postprocedural) (postinfection): Secondary | ICD-10-CM | POA: Diagnosis not present

## 2019-02-06 DIAGNOSIS — I10 Essential (primary) hypertension: Secondary | ICD-10-CM | POA: Diagnosis present

## 2019-02-06 DIAGNOSIS — R1900 Intra-abdominal and pelvic swelling, mass and lump, unspecified site: Secondary | ICD-10-CM | POA: Diagnosis present

## 2019-02-06 DIAGNOSIS — H9190 Unspecified hearing loss, unspecified ear: Secondary | ICD-10-CM | POA: Diagnosis present

## 2019-02-06 DIAGNOSIS — Z800 Family history of malignant neoplasm of digestive organs: Secondary | ICD-10-CM | POA: Diagnosis not present

## 2019-02-06 DIAGNOSIS — Z8249 Family history of ischemic heart disease and other diseases of the circulatory system: Secondary | ICD-10-CM

## 2019-02-06 HISTORY — DX: Other specified postprocedural states: Z98.890

## 2019-02-06 HISTORY — DX: Unspecified osteoarthritis, unspecified site: M19.90

## 2019-02-06 HISTORY — DX: Unspecified hearing loss, unspecified ear: H91.90

## 2019-02-06 HISTORY — PX: PARTIAL COLECTOMY: SHX5273

## 2019-02-06 HISTORY — DX: Presence of spectacles and contact lenses: Z97.3

## 2019-02-06 HISTORY — DX: Presence of dental prosthetic device (complete) (partial): Z97.2

## 2019-02-06 HISTORY — DX: Nausea with vomiting, unspecified: R11.2

## 2019-02-06 LAB — BASIC METABOLIC PANEL
Anion gap: 8 (ref 5–15)
BUN: 7 mg/dL — ABNORMAL LOW (ref 8–23)
CO2: 25 mmol/L (ref 22–32)
Calcium: 9 mg/dL (ref 8.9–10.3)
Chloride: 104 mmol/L (ref 98–111)
Creatinine, Ser: 0.7 mg/dL (ref 0.44–1.00)
GFR calc Af Amer: >60 mL/min (ref >60)
GFR calc non Af Amer: >60 mL/min (ref >60)
Glucose, Bld: 94 mg/dL (ref 70–99)
Potassium: 3.2 mmol/L — ABNORMAL LOW (ref 3.5–5.1)
Sodium: 137 mmol/L (ref 135–145)

## 2019-02-06 LAB — TYPE AND SCREEN
ABO/RH(D): O POS
Antibody Screen: NEGATIVE

## 2019-02-06 SURGERY — COLECTOMY, PARTIAL
Anesthesia: General | Laterality: Right

## 2019-02-06 MED ORDER — ONDANSETRON HCL 4 MG PO TABS: 4.0000 mg | ORAL_TABLET | Freq: Four times a day (QID) | ORAL | Status: DC | PRN

## 2019-02-06 MED ORDER — PHENYLEPHRINE HCL 10 MG/ML IJ SOLN
INTRAMUSCULAR | Status: AC
  Filled 2019-02-06: qty 1

## 2019-02-06 MED ORDER — SODIUM CHLORIDE 0.9 % IV SOLN
2.0000 g | INTRAVENOUS | Status: AC
  Administered 2019-02-06: 2 g via INTRAVENOUS
  Filled 2019-02-06: qty 2

## 2019-02-06 MED ORDER — LIDOCAINE 2% (20 MG/ML) 5 ML SYRINGE
INTRAMUSCULAR | Status: DC | PRN
  Administered 2019-02-06: 1 mg/kg/h via INTRAVENOUS

## 2019-02-06 MED ORDER — MORPHINE SULFATE (PF) 4 MG/ML IV SOLN: 1.0000 mg | INTRAVENOUS | Status: DC | PRN

## 2019-02-06 MED ORDER — KETAMINE HCL 10 MG/ML IJ SOLN
INTRAMUSCULAR | Status: DC | PRN
  Administered 2019-02-06: 25 mg via INTRAVENOUS

## 2019-02-06 MED ORDER — DEXAMETHASONE SODIUM PHOSPHATE 10 MG/ML IJ SOLN
INTRAMUSCULAR | Status: AC
  Filled 2019-02-06: qty 1

## 2019-02-06 MED ORDER — KETAMINE HCL 10 MG/ML IJ SOLN
INTRAMUSCULAR | Status: AC
  Filled 2019-02-06: qty 1

## 2019-02-06 MED ORDER — ONDANSETRON HCL 4 MG/2ML IJ SOLN
INTRAMUSCULAR | Status: AC
  Filled 2019-02-06: qty 2

## 2019-02-06 MED ORDER — FENTANYL CITRATE (PF) 250 MCG/5ML IJ SOLN
INTRAMUSCULAR | Status: AC
  Filled 2019-02-06: qty 5

## 2019-02-06 MED ORDER — DEXAMETHASONE SODIUM PHOSPHATE 10 MG/ML IJ SOLN
INTRAMUSCULAR | Status: DC | PRN
  Administered 2019-02-06: 5 mg via INTRAVENOUS

## 2019-02-06 MED ORDER — PROMETHAZINE HCL 25 MG/ML IJ SOLN
12.5000 mg | INTRAMUSCULAR | Status: DC | PRN
  Administered 2019-02-06: 12.5 mg via INTRAVENOUS
  Filled 2019-02-06: qty 1

## 2019-02-06 MED ORDER — FENTANYL CITRATE (PF) 100 MCG/2ML IJ SOLN: 25.0000 ug | INTRAMUSCULAR | Status: DC | PRN

## 2019-02-06 MED ORDER — ONDANSETRON HCL 4 MG/2ML IJ SOLN
INTRAMUSCULAR | Status: DC | PRN
  Administered 2019-02-06: 4 mg via INTRAVENOUS

## 2019-02-06 MED ORDER — SUCCINYLCHOLINE CHLORIDE 200 MG/10ML IV SOSY
PREFILLED_SYRINGE | INTRAVENOUS | Status: AC
  Filled 2019-02-06: qty 10

## 2019-02-06 MED ORDER — OXYCODONE HCL 5 MG PO TABS: 5.0000 mg | ORAL_TABLET | ORAL | Status: DC | PRN

## 2019-02-06 MED ORDER — OXYCODONE HCL 5 MG PO TABS: 5.0000 mg | ORAL_TABLET | Freq: Once | ORAL | Status: DC | PRN

## 2019-02-06 MED ORDER — ROCURONIUM BROMIDE 50 MG/5ML IV SOSY
PREFILLED_SYRINGE | INTRAVENOUS | Status: DC | PRN
  Administered 2019-02-06: 60 mg via INTRAVENOUS

## 2019-02-06 MED ORDER — LIDOCAINE 2% (20 MG/ML) 5 ML SYRINGE
INTRAMUSCULAR | Status: AC
  Filled 2019-02-06: qty 5

## 2019-02-06 MED ORDER — SUGAMMADEX SODIUM 200 MG/2ML IV SOLN
INTRAVENOUS | Status: AC
  Filled 2019-02-06: qty 2

## 2019-02-06 MED ORDER — ACETAMINOPHEN 325 MG PO TABS: 650.0000 mg | ORAL_TABLET | Freq: Four times a day (QID) | ORAL | Status: DC | PRN

## 2019-02-06 MED ORDER — ONDANSETRON HCL 4 MG/2ML IJ SOLN
4.0000 mg | Freq: Once | INTRAMUSCULAR | Status: AC | PRN
  Administered 2019-02-06: 4 mg via INTRAVENOUS

## 2019-02-06 MED ORDER — LIDOCAINE HCL 2 % IJ SOLN
INTRAMUSCULAR | Status: AC
  Filled 2019-02-06: qty 40

## 2019-02-06 MED ORDER — PROPOFOL 10 MG/ML IV BOLUS
INTRAVENOUS | Status: AC
  Filled 2019-02-06: qty 20

## 2019-02-06 MED ORDER — ROCURONIUM BROMIDE 100 MG/10ML IV SOLN
INTRAVENOUS | Status: AC
  Filled 2019-02-06: qty 1

## 2019-02-06 MED ORDER — LACTATED RINGERS IV SOLN
INTRAVENOUS | Status: DC
  Administered 2019-02-06 – 2019-02-07 (×4): via INTRAVENOUS

## 2019-02-06 MED ORDER — ENSURE SURGERY PO LIQD
237.0000 mL | Freq: Two times a day (BID) | ORAL | Status: DC
  Administered 2019-02-07 – 2019-02-09 (×5): 237 mL via ORAL
  Filled 2019-02-06 (×7): qty 237

## 2019-02-06 MED ORDER — PHENYLEPHRINE 40 MCG/ML (10ML) SYRINGE FOR IV PUSH (FOR BLOOD PRESSURE SUPPORT)
PREFILLED_SYRINGE | INTRAVENOUS | Status: DC | PRN
  Administered 2019-02-06 (×2): 80 ug via INTRAVENOUS

## 2019-02-06 MED ORDER — LIDOCAINE 2% (20 MG/ML) 5 ML SYRINGE
INTRAMUSCULAR | Status: DC | PRN
  Administered 2019-02-06: 60 mg via INTRAVENOUS

## 2019-02-06 MED ORDER — HYDROMORPHONE HCL 1 MG/ML IJ SOLN
1.0000 mg | INTRAMUSCULAR | Status: DC | PRN
  Administered 2019-02-06: 1 mg via INTRAVENOUS
  Filled 2019-02-06: qty 1

## 2019-02-06 MED ORDER — ALVIMOPAN 12 MG PO CAPS
12.0000 mg | ORAL_CAPSULE | Freq: Two times a day (BID) | ORAL | Status: DC
  Administered 2019-02-07 (×2): 12 mg via ORAL
  Filled 2019-02-06 (×2): qty 1

## 2019-02-06 MED ORDER — PHENYLEPHRINE HCL 10 MG/ML IJ SOLN
INTRAVENOUS | Status: DC | PRN
  Administered 2019-02-06: 25 ug/min via INTRAVENOUS

## 2019-02-06 MED ORDER — SUGAMMADEX SODIUM 200 MG/2ML IV SOLN
INTRAVENOUS | Status: DC | PRN
  Administered 2019-02-06: 200 mg via INTRAVENOUS

## 2019-02-06 MED ORDER — PHENYLEPHRINE 40 MCG/ML (10ML) SYRINGE FOR IV PUSH (FOR BLOOD PRESSURE SUPPORT)
PREFILLED_SYRINGE | INTRAVENOUS | Status: AC
  Filled 2019-02-06: qty 10

## 2019-02-06 MED ORDER — FENTANYL CITRATE (PF) 100 MCG/2ML IJ SOLN
INTRAMUSCULAR | Status: AC
  Filled 2019-02-06: qty 2

## 2019-02-06 MED ORDER — ONDANSETRON HCL 4 MG/2ML IJ SOLN
4.0000 mg | Freq: Four times a day (QID) | INTRAMUSCULAR | Status: DC | PRN
  Administered 2019-02-06: 4 mg via INTRAVENOUS
  Filled 2019-02-06: qty 2

## 2019-02-06 MED ORDER — OXYCODONE HCL 5 MG/5ML PO SOLN: 5.0000 mg | Freq: Once | ORAL | Status: DC | PRN

## 2019-02-06 MED ORDER — GABAPENTIN 300 MG PO CAPS
300.0000 mg | ORAL_CAPSULE | ORAL | Status: AC
  Administered 2019-02-06: 300 mg via ORAL
  Filled 2019-02-06: qty 1

## 2019-02-06 MED ORDER — GABAPENTIN 300 MG PO CAPS
300.0000 mg | ORAL_CAPSULE | Freq: Two times a day (BID) | ORAL | Status: DC
  Administered 2019-02-06 – 2019-02-09 (×6): 300 mg via ORAL
  Filled 2019-02-06 (×6): qty 1

## 2019-02-06 MED ORDER — ALVIMOPAN 12 MG PO CAPS
12.0000 mg | ORAL_CAPSULE | ORAL | Status: AC
  Administered 2019-02-06: 12 mg via ORAL
  Filled 2019-02-06: qty 1

## 2019-02-06 MED ORDER — 0.9 % SODIUM CHLORIDE (POUR BTL) OPTIME
TOPICAL | Status: DC | PRN
  Administered 2019-02-06: 2000 mL

## 2019-02-06 MED ORDER — DILTIAZEM HCL ER 90 MG PO CP12
180.0000 mg | ORAL_CAPSULE | Freq: Every day | ORAL | Status: DC
  Administered 2019-02-06: 180 mg via ORAL
  Filled 2019-02-06: qty 2

## 2019-02-06 MED ORDER — PROPOFOL 10 MG/ML IV BOLUS
INTRAVENOUS | Status: DC | PRN
  Administered 2019-02-06: 100 mg via INTRAVENOUS

## 2019-02-06 MED ORDER — FENTANYL CITRATE (PF) 100 MCG/2ML IJ SOLN
INTRAMUSCULAR | Status: DC | PRN
  Administered 2019-02-06 (×4): 50 ug via INTRAVENOUS

## 2019-02-06 MED ORDER — ALVIMOPAN 12 MG PO CAPS: 12.0000 mg | ORAL_CAPSULE | ORAL | Status: DC

## 2019-02-06 MED ORDER — FENTANYL CITRATE (PF) 100 MCG/2ML IJ SOLN
25.0000 ug | INTRAMUSCULAR | Status: DC | PRN
  Administered 2019-02-06: 50 ug via INTRAVENOUS

## 2019-02-06 MED ORDER — DILTIAZEM HCL ER 90 MG PO CP12
180.0000 mg | ORAL_CAPSULE | Freq: Every day | ORAL | Status: DC
  Filled 2019-02-06: qty 2

## 2019-02-06 SURGICAL SUPPLY — 45 items
BLADE EXTENDED COATED 6.5IN (ELECTRODE) IMPLANT
CHLORAPREP W/TINT 26ML (MISCELLANEOUS) ×3 IMPLANT
COVER SURGICAL LIGHT HANDLE (MISCELLANEOUS) ×4 IMPLANT
COVER WAND RF STERILE (DRAPES) IMPLANT
DRAPE SHEET LG 3/4 BI-LAMINATE (DRAPES) ×1 IMPLANT
DRSG OPSITE POSTOP 4X10 (GAUZE/BANDAGES/DRESSINGS) IMPLANT
DRSG OPSITE POSTOP 4X6 (GAUZE/BANDAGES/DRESSINGS) IMPLANT
DRSG OPSITE POSTOP 4X8 (GAUZE/BANDAGES/DRESSINGS) ×2 IMPLANT
DRSG PAD ABDOMINAL 8X10 ST (GAUZE/BANDAGES/DRESSINGS) IMPLANT
ELECT PENCIL ROCKER SW 15FT (MISCELLANEOUS) ×2 IMPLANT
ELECT REM PT RETURN 15FT ADLT (MISCELLANEOUS) ×2 IMPLANT
GAUZE SPONGE 4X4 12PLY STRL (GAUZE/BANDAGES/DRESSINGS) IMPLANT
GLOVE BIOGEL PI IND STRL 6.5 (GLOVE) IMPLANT
GLOVE BIOGEL PI INDICATOR 6.5 (GLOVE) ×2
GLOVE SURG ORTHO 8.0 STRL STRW (GLOVE) ×4 IMPLANT
GLOVE SURG SS PI 6.5 STRL IVOR (GLOVE) ×2 IMPLANT
GOWN STRL REUS W/TWL XL LVL3 (GOWN DISPOSABLE) ×8 IMPLANT
HANDLE SUCTION POOLE (INSTRUMENTS) IMPLANT
LIGASURE IMPACT 36 18CM CVD LR (INSTRUMENTS) ×1 IMPLANT
PACK COLON (CUSTOM PROCEDURE TRAY) ×2 IMPLANT
RELOAD PROXIMATE 75MM BLUE (ENDOMECHANICALS) ×4 IMPLANT
RELOAD STAPLE 75 3.8 BLU REG (ENDOMECHANICALS) IMPLANT
SPONGE LAP 18X18 RF (DISPOSABLE) ×2 IMPLANT
STAPLER GUN LINEAR PROX 60 (STAPLE) ×1 IMPLANT
STAPLER PROXIMATE 75MM BLUE (STAPLE) ×1 IMPLANT
STAPLER VISISTAT 35W (STAPLE) ×2 IMPLANT
SUCTION POOLE HANDLE (INSTRUMENTS)
SUT NOV 1 T60/GS (SUTURE) IMPLANT
SUT NOVA NAB DX-16 0-1 5-0 T12 (SUTURE) IMPLANT
SUT NOVA T20/GS 25 (SUTURE) IMPLANT
SUT PDS AB 1 CTX 36 (SUTURE) ×2 IMPLANT
SUT PDS AB 1 TP1 96 (SUTURE) IMPLANT
SUT SILK 2 0 (SUTURE) ×4
SUT SILK 2 0 SH CR/8 (SUTURE) ×2 IMPLANT
SUT SILK 2 0SH CR/8 30 (SUTURE) IMPLANT
SUT SILK 2-0 18XBRD TIE 12 (SUTURE) ×1 IMPLANT
SUT SILK 2-0 30XBRD TIE 12 (SUTURE) ×1 IMPLANT
SUT SILK 3 0 (SUTURE)
SUT SILK 3 0 SH CR/8 (SUTURE) ×2 IMPLANT
SUT SILK 3-0 18XBRD TIE 12 (SUTURE) ×1 IMPLANT
TOWEL OR 17X26 10 PK STRL BLUE (TOWEL DISPOSABLE) IMPLANT
TOWEL OR NON WOVEN STRL DISP B (DISPOSABLE) ×4 IMPLANT
TRAY FOLEY MTR SLVR 14FR STAT (SET/KITS/TRAYS/PACK) ×1 IMPLANT
TRAY FOLEY MTR SLVR 16FR STAT (SET/KITS/TRAYS/PACK) ×1 IMPLANT
TUBING CONNECTING 10 (TUBING) ×4 IMPLANT

## 2019-02-06 NOTE — Anesthesia Procedure Notes (Signed)
Procedure Name: Intubation Date/Time: 02/06/2019 7:42 AM Performed by: Maxwell Caul, CRNA Pre-anesthesia Checklist: Patient identified, Emergency Drugs available, Suction available and Patient being monitored Patient Re-evaluated:Patient Re-evaluated prior to induction Oxygen Delivery Method: Circle system utilized Preoxygenation: Pre-oxygenation with 100% oxygen Induction Type: IV induction Ventilation: Mask ventilation without difficulty Laryngoscope Size: Mac and 3 Grade View: Grade I Tube type: Oral Tube size: 7.0 mm Number of attempts: 1 Airway Equipment and Method: Stylet Placement Confirmation: ETT inserted through vocal cords under direct vision,  positive ETCO2 and breath sounds checked- equal and bilateral Secured at: 21 cm Tube secured with: Tape Dental Injury: Teeth and Oropharynx as per pre-operative assessment

## 2019-02-06 NOTE — Transfer of Care (Signed)
Immediate Anesthesia Transfer of Care Note  Patient: Kristen Cruz  Procedure(s) Performed: OPEN RIGHT COLECTOMY (Right )  Patient Location: PACU  Anesthesia Type:General  Level of Consciousness: awake, alert  and oriented  Airway & Oxygen Therapy: Patient Spontanous Breathing and Patient connected to face mask oxygen  Post-op Assessment: Report given to RN and Post -op Vital signs reviewed and stable  Post vital signs: Reviewed and stable  Last Vitals:  Vitals Value Taken Time  BP 162/90 02/06/2019  9:21 AM  Temp    Pulse 96 02/06/2019  9:25 AM  Resp 16 02/06/2019  9:25 AM  SpO2 100 % 02/06/2019  9:25 AM  Vitals shown include unvalidated device data.  Last Pain: There were no vitals filed for this visit.       Complications: No apparent anesthesia complications

## 2019-02-06 NOTE — Interval H&P Note (Signed)
History and Physical Interval Note:  02/06/2019 6:58 AM  Kristen Cruz  has presented today for surgery, with the diagnosis of CECAL MASS.  The various methods of treatment have been discussed with the patient and family. After consideration of risks, benefits and other options for treatment, the patient has consented to    Procedure(s): OPEN RIGHT COLECTOMY (Right) as a surgical intervention .    The patient's history has been reviewed, patient examined, no change in status, stable for surgery.  I have reviewed the patient's chart and labs.  Questions were answered to the patient's satisfaction.    Armandina Gemma, [facility] Office: [place]

## 2019-02-06 NOTE — Op Note (Signed)
Operative Note  Pre-operative Diagnosis:  Cecal mass, gastrointestinal bleeding  Post-operative Diagnosis:  same  Surgeon:  Armandina Gemma, MD  Assistant:  none   Procedure:  Open right colectomy  Anesthesia:  general  Estimated Blood Loss:  < 50 cc  Drains: none         Specimen: right colon to pathology  Indications:  Patient is referred by Dr. Clarene Essex for surgical evaluation and management of a near obstructing cecal mass. Patient has a history of anemia. She is on anticoagulation for atrial fibrillation. She had been hospitalized in October 2019 with anemia. Part of her evaluation included an upper and lower endoscopy which was performed last week. Patient was found to have a mass in the cecum which was worrisome for neoplasm and felt to be near obstructing. There was evidence of recent bleeding. Most recent hemoglobin was 9.3. Patient is now referred to surgery for consideration for right colectomy for management of cecal mass.  Procedure Details:  The patient was seen in the pre-op holding area. The risks, benefits, complications, treatment options, and expected outcomes were previously discussed with the patient. The patient agreed with the proposed plan and has signed the informed consent form.  The patient was brought to the operating room by the surgical team, identified as [name] and the procedure verified. A "time out" was completed and the above information confirmed.  Patient was placed in a supine position on the operating room table.  After ascertaining that an adequate level of anesthesia been achieved, the patient was prepped and draped in the usual strict aseptic fashion.  Transverse right mid abdominal incision is made with a #10 blade.  Dissection is carried through the subcutaneous tissues and hemostasis achieved with the electrocautery.  Anterior fascia was incised.  Rectus muscle was divided with the electrocautery.  Posterior fascia was incised and  the peritoneal cavity was entered cautiously.  Abdomen was explored.  There were adhesions to the midline which were taken down with the electrocautery.  Liver was palpated and there was no evidence of obvious metastatic disease.  Gallbladder appeared normal.  The cecum contained a firm hard mass measuring approximately 4 cm in greatest dimension.  The terminal ileum was somewhat adherent to the right adnexa.  Adhesions were lysed sharply and the terminal ileum and cecum were delivered out of the right pelvis.  Right colon was mobilized along its peritoneal reflection.  Using the LigaSure, the hepatic flexure was mobilized.  A point in the distal ileum was selected and transected with a GIA stapler.  Appendix appears to be surgically absent.:  Mesentery is divided with the LigaSure.  Dissection is carried along the ascending colon towards the hepatic flexure.  The proximal transverse colon is mobilized.  Omentum is dissected off of the transverse colon.  A point in the proximal transverse colon is selected and transected using a GIA stapler.  Right colic vessels are ligated with 2-0 silk sutures and divided with the LigaSure.  The entire colon specimen is submitted to pathology for review.  Next a side-to-side anastomosis is created between the terminal ileum and the proximal transverse colon.  This is performed with a GIA stapler.  Good hemostasis is noted along the staple line.  Enterotomy is closed with a TA 60 stapler.  Mesenteric defect is closed with interrupted 2-0 silk sutures.  Anastomosis appears to be widely patent.  At this point the gowns, gloves, and drapes are changed.  Instruments sets are changed.  Abdomen is then irrigated with warm saline.  Good hemostasis is achieved along the staple lines with interrupted 3-0 silk suture.  Saline is evacuated from the peritoneal cavity.  Anastomosis is covered with omentum.  Abdominal wall was closed in 2 layers with running #1 PDS suture.  Subcutaneous  tissues irrigated.  Skin is closed with stainless steel staples.  A honeycomb dressing is applied after washing and drying the incision.  Patient is awakened from anesthesia and transported to the recovery room.  The patient tolerated the procedure well.  Armandina Gemma, MD [facility] Office: [phone]

## 2019-02-06 NOTE — Progress Notes (Signed)
Talked with on call Cardiologist Barrett who asked to have rhythm strips of pause and SB faxed to cath lab and entered in Epic which was done and also faxed. Per her they will f/u with pt in the morning unless critical event occurs.

## 2019-02-06 NOTE — Progress Notes (Signed)
Paged Dr. Radford Pax per verbal request from Dr. Harlow Asa, x 3 and still awaiting call back. Will continue to monitor

## 2019-02-06 NOTE — Telephone Encounter (Signed)
Called Megan back and advised her to call on call cardiologist.

## 2019-02-06 NOTE — Telephone Encounter (Signed)
They need to call Cardiologist on call at the hospital

## 2019-02-06 NOTE — Progress Notes (Signed)
RN called because pt had surgery today, she reported pt in ?Afib, HR documented at 111.   She had suddenly slowed down and was bradycardia for several seconds. She then returned to SR.  VS stable. No syncope.  Dr Harlow Asa had requested cards consult.  Strips faxed to the cath lab, reviewed by Dr Domenic Polite. They show marked sinus bradycardia with HR in the 20s, but then HR had increased.  No ECG available from rhythm before the bradycardia.  For now, keep on telemetry.  Have changed Diltiazem to start on 02/20, but left it on her meds as she takes it at home.  Cards to see in am. Call for additional arrhythmia.  Rosaria Ferries, PA-C 02/06/2019 7:52 PM [place] [phone]

## 2019-02-06 NOTE — Telephone Encounter (Signed)
Jinny Blossom, RN at [facility] called wanting to speak to Dr. Theodosia Blender nurse. She states to tired paging Dr. Radford Pax a few times today.  She says she needs to speak to Dr. Radford Pax about this patient as she had a procedure done today and had a cardiac event.    Jinny Blossom can be reached at [phone]

## 2019-02-06 NOTE — Telephone Encounter (Signed)
Tried to call number left in message. Forward to Dr. Radford Pax.

## 2019-02-06 NOTE — Anesthesia Postprocedure Evaluation (Signed)
Anesthesia Post Note  Patient: Kristen Cruz  Procedure(s) Performed: OPEN RIGHT COLECTOMY (Right )     Patient location during evaluation: PACU Anesthesia Type: General Level of consciousness: awake and alert Pain management: pain level controlled Vital Signs Assessment: post-procedure vital signs reviewed and stable Respiratory status: spontaneous breathing, nonlabored ventilation, respiratory function stable and patient connected to nasal cannula oxygen Cardiovascular status: blood pressure returned to baseline and stable Postop Assessment: no apparent nausea or vomiting Anesthetic complications: no    Last Vitals:  Vitals:   02/06/19 1045 02/06/19 1107  BP: (!) 148/78 (!) 163/78  Pulse: 83 97  Resp: (!) 23 18  Temp: 36.4 C 36.6 C  SpO2: 100% 96%    Last Pain:  Vitals:   02/06/19 1107  TempSrc: Oral  PainSc:                  Zahir Eisenhour COKER

## 2019-02-06 NOTE — Progress Notes (Signed)
I am not on rounding service this week and was in the office seeing clinic patients.  Was not made aware of the call from the nurse by my staff until this evening.  Any questions regarding inpatients needs to go to the covering [facility] Cardiology rounding team or the on call Cardiologist at [facility].

## 2019-02-07 ENCOUNTER — Encounter (HOSPITAL_COMMUNITY): Payer: Self-pay | Admitting: Surgery

## 2019-02-07 ENCOUNTER — Telehealth: Payer: Self-pay | Admitting: Pharmacist

## 2019-02-07 DIAGNOSIS — R001 Bradycardia, unspecified: Secondary | ICD-10-CM

## 2019-02-07 DIAGNOSIS — I480 Paroxysmal atrial fibrillation: Secondary | ICD-10-CM

## 2019-02-07 LAB — CBC
HCT: 31.6 % — ABNORMAL LOW (ref 36.0–46.0)
Hemoglobin: 9.7 g/dL — ABNORMAL LOW (ref 12.0–15.0)
MCH: 27.6 pg (ref 26.0–34.0)
MCHC: 30.7 g/dL (ref 30.0–36.0)
MCV: 89.8 fL (ref 80.0–100.0)
Platelets: 287 10*3/uL (ref 150–400)
RBC: 3.52 MIL/uL — ABNORMAL LOW (ref 3.87–5.11)
RDW: 13.4 % (ref 11.5–15.5)
WBC: 11.6 10*3/uL — ABNORMAL HIGH (ref 4.0–10.5)
nRBC: 0 % (ref 0.0–0.2)

## 2019-02-07 LAB — BASIC METABOLIC PANEL
Anion gap: 7 (ref 5–15)
BUN: 8 mg/dL (ref 8–23)
CO2: 26 mmol/L (ref 22–32)
Calcium: 8.6 mg/dL — ABNORMAL LOW (ref 8.9–10.3)
Chloride: 104 mmol/L (ref 98–111)
Creatinine, Ser: 0.75 mg/dL (ref 0.44–1.00)
GFR calc Af Amer: >60 mL/min (ref >60)
GFR calc non Af Amer: >60 mL/min (ref >60)
Glucose, Bld: 111 mg/dL — ABNORMAL HIGH (ref 70–99)
Potassium: 4.4 mmol/L (ref 3.5–5.1)
Sodium: 137 mmol/L (ref 135–145)

## 2019-02-07 MED ORDER — APIXABAN 5 MG PO TABS: 5.0000 mg | ORAL_TABLET | Freq: Two times a day (BID) | ORAL | Status: DC

## 2019-02-07 MED ORDER — APIXABAN 2.5 MG PO TABS: 2.5000 mg | ORAL_TABLET | Freq: Two times a day (BID) | ORAL | 5 refills | Status: DC

## 2019-02-07 MED ORDER — APIXABAN 2.5 MG PO TABS
2.5000 mg | ORAL_TABLET | Freq: Two times a day (BID) | ORAL | Status: DC
  Administered 2019-02-07 – 2019-02-09 (×4): 2.5 mg via ORAL
  Filled 2019-02-07 (×4): qty 1

## 2019-02-07 NOTE — Telephone Encounter (Signed)
Patient qualifies for dose reduction of apixaban due to age > or = to 82 and TBW <60kg. Notified by inpatient pharmacist that does needs to be reduced. Will send new Rx to pharmacy for 2.5mg  BID. Inpatient pharmacist to get order changed for after patient's surgery and to inform patient of the change made, and new rx sent.   Appreciative of the call

## 2019-02-07 NOTE — Consult Note (Addendum)
Cardiology Consultation:   Patient ID: Kristen Cruz MRN: [id number]; DOB: 1936-07-09  Admit date: 02/06/2019 Date of Consult: 02/07/2019  Primary Care Provider: Eulas Post, MD Primary Cardiologist: Fransico Him, MD  Primary Electrophysiologist:  None    Patient Profile:   Kristen Cruz is a 83 y.o. female with a hx  paroxysmal atrial fibrillation, HTN, and hx of GIB 09/2018 who is being seen today for the evaluation of bradycardia at the request of Dr. Harlow Asa.  History of Present Illness:   Kristen Cruz was last seen in the office on 09/22/2018 by Dr. Radford Pax at [place] time whe was doing well, maintaining sinus rhythm on Diltiazem and Eliquis for CHADS2VASC score of 4. 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 On 02/06/19 she underwent open right colectomy. Our service received a call on the day of surgery reporting that pt was in ?Afib with HR documented at 111 bpm. Reportedly her HR slowed and was bradycardic for several seconds. She then returned to sinus rhythm. Strips faxed to the cath lab, reviewed by Dr Domenic Polite. They show marked sinus bradycardia with HR in the 20s, but then HR had increased. No ECG available from rhythm before the bradycardia.  EKG done afterwards showed normal sinus rhythm at 74 bpm.  The patient's home diltiazem has been held with plans to resume tomorrow.  She did get a dose last evening.  Today she is doing well.. She notes that she was asleep yesterday when her HR dropped. Her husband had noted that she appears to stop breathing at times while asleep and he wonders if she has sleep apnea. Pt says a nurse walked in to wake her up and see if she was OK  The patient has been up walking around and has not noted any further afib nor any lightheadedness, chest discomfort, shortness of breath or any activity intolerance.     Upon review of telemetry I note that pt was in sinus rhythm in the 80's  and then at 12:30 she brady's down to 50's >40's then 20's for ~18 seconds with p waves present, followed by a few junctional beats then sinus rhythm in the 80's. (4.5 sec pause)  Past Medical History:  Diagnosis Date  . Anticoagulant long-term use    eliquis  . Arthritis   . Cecum mass   . History of GI bleed 09/22/2018   upper  . HOH (hard of hearing)   . Hypertension   . IDA (iron deficiency anemia)   . Persistent atrial fibrillation cardiologist-- dr Radford Pax   CHADS2VASC score is 4 on Apixaban  . PONV (postoperative nausea and vomiting)   . Rash    "allergic to my on body tempurature , if I have fever I break out in a rash"  . Right nephrolithiasis    per CT 09-22-2018  . Wears dentures    upper  . Wears glasses     Past Surgical History:  Procedure Laterality Date  . ABDOMINAL HYSTERECTOMY  1978   ovaries remain  . BREAST CYST EXCISION Right yrs ago   benign  . CATARACT EXTRACTION W/ INTRAOCULAR LENS  IMPLANT, BILATERAL  2014  approx.  . COLONOSCOPY  01-22-2019   dr Watt Climes     Home Medications:  Prior to Admission medications   Medication Sig Start Date End Date Taking? Authorizing Provider  apixaban (ELIQUIS) 5 MG TABS tablet Take 1 tablet (5 mg  total) by mouth 2 (two) times daily. 09/22/18 09/22/19 Yes Turner, Traci R, MD  DILT-XR 180 MG 24 hr capsule TAKE 1 CAPSULE BY MOUTH EVERY DAY Patient taking differently: Take 180 mg by mouth daily.  09/05/18  Yes Turner, Eber Hong, MD  ferrous sulfate 325 (65 FE) MG EC tablet Take 1 tablet (325 mg total) by mouth 2 (two) times daily. Patient taking differently: Take 325 mg by mouth daily.  09/23/18 09/23/19 Yes Georgette Shell, MD  Polyvinyl Alcohol-Povidone (REFRESH OP) Apply 2 drops to eye 2 (two) times daily as needed (dry eyes).    Yes [provider]  vitamin B-12 (CYANOCOBALAMIN) 1000 MCG tablet Take 1,000 mcg by mouth daily.   Yes [provider]  vitamin C (ASCORBIC ACID) 250 MG tablet Take 1 tablet  (250 mg total) by mouth daily. 09/23/18  Yes Georgette Shell, MD  GAVILYTE-N WITH FLAVOR PACK 420 g solution Take 420 g by mouth once. 01/19/19   [provider]    Inpatient Medications: Scheduled Meds: . alvimopan  12 mg Oral BID  . apixaban  5 mg Oral BID  . [START ON 02/08/2019] diltiazem  180 mg Oral Daily  . feeding supplement  237 mL Oral BID BM  . gabapentin  300 mg Oral BID   Continuous Infusions: . lactated ringers 50 mL/hr at 02/07/19 0721   PRN Meds: acetaminophen, ondansetron **OR** ondansetron (ZOFRAN) IV, oxyCODONE, promethazine  Allergies:    Allergies  Allergen Reactions  . Asa [Aspirin] Rash  . Bentyl [Dicyclomine Hcl] Rash  . Sucrets [Dyclonine] Rash  . Sulfa Antibiotics Rash  . Tetracycline Hcl Rash  . Tylenol [Acetaminophen] Rash    Social History:   Social History   Socioeconomic History  . Marital status: Married    Spouse name: Not on file  . Number of children: Not on file  . Years of education: Not on file  . Highest education level: Not on file  Occupational History  . Not on file  Social Needs  . Financial resource strain: Not on file  . Food insecurity:    Worry: Not on file    Inability: Not on file  . Transportation needs:    Medical: Not on file    Non-medical: Not on file  Tobacco Use  . Smoking status: Never Smoker  . Smokeless tobacco: Never Used  Substance and Sexual Activity  . Alcohol use: No  . Drug use: Never  . Sexual activity: Not on file  Lifestyle  . Physical activity:    Days per week: Not on file    Minutes per session: Not on file  . Stress: Not on file  Relationships  . Social connections:    Talks on phone: Not on file    Gets together: Not on file    Attends religious service: Not on file    Active member of club or organization: Not on file    Attends meetings of clubs or organizations: Not on file    Relationship status: Not on file  . Intimate partner violence:    Fear of current or ex  partner: Not on file    Emotionally abused: Not on file    Physically abused: Not on file    Forced sexual activity: Not on file  Other Topics Concern  . Not on file  Social History Narrative  . Not on file    Family History:    Family History  Problem Relation Age of Onset  .  Hypertension Mother   . Heart Problems Mother   . Heart attack Mother   . Cancer Sister   . Cancer Brother      ROS:  Please see the history of present illness.   All other ROS reviewed and negative.     Physical Exam/Data:   Vitals:   02/07/19 0025 02/07/19 0028 02/07/19 0429 02/07/19 0700  BP: 137/88 128/64 129/65   Pulse: 82 80 90   Resp:  19 16   Temp:  99.1 F (37.3 C) 98.9 F (37.2 C)   TempSrc:  Oral Oral   SpO2: 95% 100% 100%   Weight:    44.1 kg  Height:        Intake/Output Summary (Last 24 hours) at 02/07/2019 0748 Last data filed at 02/07/2019 0932 Gross per 24 hour  Intake 2762.46 ml  Output 2525 ml  Net 237.46 ml   Last 3 Weights 02/07/2019 02/06/2019 02/01/2019  Weight (lbs) 97 lb 4.8 oz 96 lb 8 oz 97 lb 8 oz  Weight (kg) 44.135 kg 43.772 kg 44.226 kg     Body mass index is 17.8 kg/m.  General:  Thin female, in no acute distress HEENT: normal Lymph: no adenopathy Neck: no JVD Endocrine:  No thryomegaly Vascular: No carotid bruits; FA pulses 2+ bilaterally without bruits  Cardiac:  normal S1, S2; RRR; 2/6 harsh apical murmur Lungs:  clear to auscultation bilaterally, no wheezing, rhonchi or rales  Abd: soft, nontender, no hepatomegaly  Ext: no edema Musculoskeletal:  No deformities, BUE and BLE strength normal and equal Skin: warm and dry  Neuro:  CNs 2-12 intact, no focal abnormalities noted Psych:  Normal affect   EKG:  The EKG was personally reviewed and demonstrates:  Normal sinus rhythm, 74 bpm, Left axis deviation, Moderate voltage criteria for LVH, may be normal variant Telemetry:  Telemetry was personally reviewed and demonstrates:  Sinus rhythm  70's-80's  Relevant CV Studies:  Echocardiogram 03/08/2014 Study Conclusions Left ventricle: The cavity size was normal. Systolic function was normal. The estimated ejection fraction was in the range of 55% to 65%. Wall motion was normal; there were no regional wall motion abnormalities.  Laboratory Data:  Chemistry Recent Labs  Lab 02/06/19 0600 02/07/19 0424  NA 137 137  K 3.2* 4.4  CL 104 104  CO2 25 26  GLUCOSE 94 111*  BUN 7* 8  CREATININE 0.70 0.75  CALCIUM 9.0 8.6*  GFRNONAA >60 >60  GFRAA >60 >60  ANIONGAP 8 7    No results for input(s): PROT, ALBUMIN, AST, ALT, ALKPHOS, BILITOT in the last 168 hours. Hematology Recent Labs  Lab 02/01/19 1525 02/07/19 0424  WBC 6.5 11.6*  RBC 3.47* 3.52*  HGB 9.7* 9.7*  HCT 32.2* 31.6*  MCV 92.8 89.8  MCH 28.0 27.6  MCHC 30.1 30.7  RDW 13.6 13.4  PLT 314 287   Cardiac EnzymesNo results for input(s): TROPONINI in the last 168 hours. No results for input(s): TROPIPOC in the last 168 hours.  BNPNo results for input(s): BNP, PROBNP in the last 168 hours.  DDimer No results for input(s): DDIMER in the last 168 hours.  Radiology/Studies:  No results found.  Assessment and Plan:   1  [place]  -Pt.  Admitted for abdominal surgery and reportedly developed atrial fib at 111 bpm at some point.  After surgery while on the nursing unit she was in sinus rhythm and had an episode of extreme sinus bradycardia, 20's, followed by few junctional beats followed  by normal sinus rhythm. She was asleep for this episode.  -The patient had missed her dose of diltiazem on the day before surgery as she was sick.  -She got her dilt yest evening. Now maintaining SR in the 70's-80's with no recurrent bradycardia or pauses.  -Her husband reports that she stops breathing in her sleep. Her bradycardic episode may be related to sleep apnea or vagal episode with recent abdominal surgery.  Would continue to watch. Resume her home med. We can see  her in hospital follow up and arrange for possible sleep study to evaluate for sleep apnea.   2  PAF   As above   -Surgery plans to resume her anticoagulation today. Reduced dose due to age 89y and wt <60 kg.   1. Hypertension -Blood pressures currently well controlled and stable.  3.   Anemia -Possibly related to cecal mass which has now been removed. -Hemoglobin is stable today at 9.7.     For questions or updates, please contact [place] Please consult [email or web address] for contact info under    Signed, Daune Perch, NP  02/07/2019 7:48 AM   Patient seen and examined  I have amended note above   Pt an 83 yo with a history of PAFAdmitted for removal of cecal mass   Had surgery. Episdoe of PAF at some point   After surgery had sinus pause of about 4.5 seconds  Reportedly sleeping at time  Milda Smart with apnea at time)    Currently in SR   She has walked without problmem  On exam, comfortable  Neck with no JVD Lungs are CTA    Cardiac RRR   No S3   No murmurs Abd is supple  Ext are without edema   I would keep on same meds     Continue telemetry    Will continue to follow pt's progress.  Dorris Carnes MD

## 2019-02-07 NOTE — Progress Notes (Signed)
     Assessment & Plan: POD#1 - status post open right colectomy  Tolerating clear liquid diet - advance as tolerated per ERAS protocol  OOB, up to chair; ambulated in halls last PM!  Loose BM this AM - would continue Entereg for now Atrial fibrillation  On telemetry  One significant pause noted - cardiology informed  Resume anticoagulation today per pharmacy        Armandina Gemma, MD       [facility]       Office: [phone]   Chief Complaint: Cecal mass  Subjective: Patient up to bedside commode, mild pain, loose BM's.  Taking clear liquid diet.  Objective: Vital signs in last 24 hours: Temp:  [97.6 F (36.4 C)-99.1 F (37.3 C)] 98.9 F (37.2 C) (02/19 0429) Pulse Rate:  [79-106] 90 (02/19 0429) Resp:  [10-24] 16 (02/19 0429) BP: (100-163)/(62-90) 129/65 (02/19 0429) SpO2:  [92 %-100 %] 100 % (02/19 0429) Weight:  [43.8 kg-44.1 kg] 44.1 kg (02/19 0700) Last BM Date: (UTA)  Intake/Output from previous day: 02/18 0701 - 02/19 0700 In: 2762.5 [P.O.:300; I.V.:2262.5; IV Piggyback:200] Out: 5176 [Urine:1750; Blood:25] Intake/Output this shift: Total I/O In: -  Out: 750 [Urine:750]  Physical Exam: HEENT - sclerae clear, mucous membranes moist Neck - soft Chest - clear bilaterally Cor - controlled Abdomen - soft, dressing dry and intact Ext - no edema, non-tender Neuro - alert & oriented, no focal deficits  Lab Results:  Recent Labs    02/07/19 0424  WBC 11.6*  HGB 9.7*  HCT 31.6*  PLT 287   BMET Recent Labs    02/06/19 0600 02/07/19 0424  NA 137 137  K 3.2* 4.4  CL 104 104  CO2 25 26  GLUCOSE 94 111*  BUN 7* 8  CREATININE 0.70 0.75  CALCIUM 9.0 8.6*   PT/INR No results for input(s): LABPROT, INR in the last 72 hours. Comprehensive Metabolic Panel:    Component Value Date/Time   NA 137 02/07/2019 0424   NA 137 02/06/2019 0600   NA 139 03/20/2018 0902   NA 140 09/12/2017 0917   K 4.4 02/07/2019 0424   K 3.2 (L)  02/06/2019 0600   CL 104 02/07/2019 0424   CL 104 02/06/2019 0600   CO2 26 02/07/2019 0424   CO2 25 02/06/2019 0600   BUN 8 02/07/2019 0424   BUN 7 (L) 02/06/2019 0600   BUN 11 03/20/2018 0902   BUN 12 09/12/2017 0917   CREATININE 0.75 02/07/2019 0424   CREATININE 0.70 02/06/2019 0600   CREATININE 0.78 09/14/2016 0949   CREATININE 0.67 03/22/2016 1008   GLUCOSE 111 (H) 02/07/2019 0424   GLUCOSE 94 02/06/2019 0600   CALCIUM 8.6 (L) 02/07/2019 0424   CALCIUM 9.0 02/06/2019 0600   AST 22 09/22/2018 1832   ALT 17 09/22/2018 1832   ALKPHOS 93 09/22/2018 1832   BILITOT 0.4 09/22/2018 1832   PROT 7.5 09/22/2018 1832   ALBUMIN 4.4 09/22/2018 1832    Studies/Results: No results found.    Armandina Gemma 02/07/2019  Patient ID: Kristen Cruz, female   DOB: [birth year], 83 y.o.   MRN: [id number]

## 2019-02-07 NOTE — Progress Notes (Signed)
Patient ambulated halls of entire unit.  Tolerated well - no SOB, dizziness, etc.  States she feels good other than some occasional gas pain.

## 2019-02-08 LAB — BASIC METABOLIC PANEL
Anion gap: 6 (ref 5–15)
BUN: 13 mg/dL (ref 8–23)
CO2: 27 mmol/L (ref 22–32)
Calcium: 8.1 mg/dL — ABNORMAL LOW (ref 8.9–10.3)
Chloride: 101 mmol/L (ref 98–111)
Creatinine, Ser: 0.71 mg/dL (ref 0.44–1.00)
GFR calc Af Amer: >60 mL/min (ref >60)
GFR calc non Af Amer: >60 mL/min (ref >60)
Glucose, Bld: 137 mg/dL — ABNORMAL HIGH (ref 70–99)
Potassium: 4.1 mmol/L (ref 3.5–5.1)
Sodium: 134 mmol/L — ABNORMAL LOW (ref 135–145)

## 2019-02-08 LAB — CBC
HCT: 30.2 % — ABNORMAL LOW (ref 36.0–46.0)
Hemoglobin: 9.3 g/dL — ABNORMAL LOW (ref 12.0–15.0)
MCH: 28.4 pg (ref 26.0–34.0)
MCHC: 30.8 g/dL (ref 30.0–36.0)
MCV: 92.4 fL (ref 80.0–100.0)
Platelets: 257 10*3/uL (ref 150–400)
RBC: 3.27 MIL/uL — ABNORMAL LOW (ref 3.87–5.11)
RDW: 13.2 % (ref 11.5–15.5)
WBC: 15.2 10*3/uL — ABNORMAL HIGH (ref 4.0–10.5)
nRBC: 0 % (ref 0.0–0.2)

## 2019-02-08 MED ORDER — DILTIAZEM HCL ER COATED BEADS 180 MG PO CP24
180.0000 mg | ORAL_CAPSULE | Freq: Every day | ORAL | Status: DC
  Administered 2019-02-08 – 2019-02-09 (×2): 180 mg via ORAL
  Filled 2019-02-08 (×2): qty 1

## 2019-02-08 NOTE — Progress Notes (Addendum)
Progress Note  Patient Name: Kristen Cruz Date of Encounter: 02/08/2019  Primary Cardiologist: Fransico Him, MD   Subjective   Patient had no further pauses or bradycardia on telemetry.  Her heart rate did go up to the 120s suddenly at 07:15 this morning, sinus tachycardia.  The patient denies any increasing abdominal pain, just soreness from the surgery.  She states that she was in the bathroom having diarrhea at the time of increase in heart rate and she became very aggravated.  Blood pressure is stable.  Inpatient Medications    Scheduled Meds: . alvimopan  12 mg Oral BID  . apixaban  2.5 mg Oral BID  . diltiazem  180 mg Oral Daily  . feeding supplement  237 mL Oral BID BM  . gabapentin  300 mg Oral BID   Continuous Infusions:  PRN Meds: acetaminophen, ondansetron **OR** ondansetron (ZOFRAN) IV, oxyCODONE, promethazine   Vital Signs    Vitals:   02/07/19 1619 02/07/19 2149 02/08/19 0450 02/08/19 0800  BP: 107/71 132/60 136/65 124/62  Pulse: (!) 58 (!) 105 (!) 102 (!) 109  Resp:  16 18 16   Temp:  99.6 F (37.6 C) 99.7 F (37.6 C) 99.1 F (37.3 C)  TempSrc:  Oral Oral Oral  SpO2: 91% 96% 94% 98%  Weight:   42.1 kg   Height:        Intake/Output Summary (Last 24 hours) at 02/08/2019 0922 Last data filed at 02/07/2019 1500 Gross per 24 hour  Intake 432.67 ml  Output -  Net 432.67 ml   Last 3 Weights 02/08/2019 02/07/2019 02/06/2019  Weight (lbs) 92 lb 12.8 oz 97 lb 4.8 oz 96 lb 8 oz  Weight (kg) 42.094 kg 44.135 kg 43.772 kg      Telemetry    Sinus rhythm in the 90s overnight.  No further pauses or bradycardia.  Sudden increase in heart rate to the 120s, sinus, at 0715 this morning- Personally Reviewed  ECG    No new tracings- Personally Reviewed  Physical Exam   GEN:  Thin female, no acute distress.   Neck: No JVD Cardiac:  Regular rhythm, tachycardic, no murmurs, rubs, or gallops.  Respiratory: Clear to auscultation bilaterally. GI: Soft, mild  abdominal tenderness, non-distended  MS: No edema; No deformity. Neuro:  Nonfocal  Psych: Normal affect   Labs    Chemistry Recent Labs  Lab 02/06/19 0600 02/07/19 0424  NA 137 137  K 3.2* 4.4  CL 104 104  CO2 25 26  GLUCOSE 94 111*  BUN 7* 8  CREATININE 0.70 0.75  CALCIUM 9.0 8.6*  GFRNONAA >60 >60  GFRAA >60 >60  ANIONGAP 8 7     Hematology Recent Labs  Lab 02/01/19 1525 02/07/19 0424  WBC 6.5 11.6*  RBC 3.47* 3.52*  HGB 9.7* 9.7*  HCT 32.2* 31.6*  MCV 92.8 89.8  MCH 28.0 27.6  MCHC 30.1 30.7  RDW 13.6 13.4  PLT 314 287    Cardiac EnzymesNo results for input(s): TROPONINI in the last 168 hours. No results for input(s): TROPIPOC in the last 168 hours.   BNPNo results for input(s): BNP, PROBNP in the last 168 hours.   DDimer No results for input(s): DDIMER in the last 168 hours.   Radiology    No results found.  Cardiac Studies   Echocardiogram 03/08/2014 Study Conclusions Left ventricle: The cavity size was normal. Systolic function was normal. The estimated ejection fraction was in the range of 55% to 65%. Wall  motion was normal; there were no regional wall motion abnormalities.   Patient Profile     82 y.o. female with a hx  paroxysmal atrial fibrillation, HTN, and hx of GIB 09/2018 who is being seen for the evaluation of an episode of bradycardia after having abdominal surgery.   Assessment & Plan    Bradycardia -Pt.  Admitted for abdominal surgery and reportedly developed atrial fib at 111 bpm at some point.  After surgery while on the nursing unit she was in sinus rhythm and had an episode of extreme sinus bradycardia, 20's. Sinus pause of 4.5 seconds. -Pt was reportedly sleeping at the time.  -Now back on home meds with no recurrence of bradycardia or pauses. -Heart rate did suddenly elevate to the 120s, sinus, this morning at 7:15 AM.  Patient reports being aggravated as she was in the bathroom having loose bowel movement.  I will check  labs to evaluate for possible internal bleeding, however she has no increase in abdominal pain and blood pressure is stable.  She may just need to get back on her regular Cardizem dosing regimen as at home.   PAF -Now back on home management with diltiazem and on anticoagulation.   Hypertension -BP well controlled.    For questions or updates, please contact [place] Please consult [email or web address] for contact info under        Signed, Daune Perch, NP  02/08/2019, 9:22 AM    Pt is seen and examined   I agree with findings as noted by J hammond above  Pt resting   Tele with SR/ST   No pauses  No Afib     Would continue to follow on telemetry.  Dorris Carnes MD

## 2019-02-08 NOTE — Plan of Care (Signed)
Pt calm and cooperative this shift. Refused pain meds.

## 2019-02-08 NOTE — Progress Notes (Signed)
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 Benay Spice to see in consultation Atrial fibrillation             telemetry             appreciate cardiology consultation and follow up             anticoagulation today per pharmacy - dose adjusted  Patient is doing very well.  Anticipate discharge home tomorrow if diet tolerated and continued progress.        Armandina Gemma, MD       [facility]       Office: [phone]   Chief Complaint: Colon mass  Subjective: Patient in bed, one loose BM this AM.  Tolerating full liquid diet.  Pain controlled.  Objective: Vital signs in last 24 hours: Temp:  [98.1 F (36.7 C)-99.7 F (37.6 C)] 99.1 F (37.3 C) (02/20 0800) Pulse Rate:  [58-109] 109 (02/20 0800) Resp:  [16-18] 16 (02/20 0800) BP: (107-136)/(60-71) 124/62 (02/20 0800) SpO2:  [91 %-100 %] 98 % (02/20 0800) Weight:  [42.1 kg] 42.1 kg (02/20 0450) Last BM Date: 02/08/19  Intake/Output from previous day: 02/19 0701 - 02/20 0700 In: 432.7 [I.V.:432.7] Out: 750 [Urine:750] Intake/Output this shift: No intake/output data recorded.  Physical Exam: HEENT - sclerae clear, mucous membranes moist Neck - soft Chest - clear bilaterally Cor - RRR Abdomen - soft without distension; wound dry and intact; BS present Ext - no edema, non-tender Neuro - alert & oriented, no focal deficits  Lab Results:  Recent Labs    02/07/19 0424  WBC 11.6*  HGB 9.7*  HCT 31.6*  PLT 287   BMET Recent Labs    02/06/19 0600 02/07/19 0424  NA 137 137  K 3.2* 4.4  CL 104 104  CO2 25 26  GLUCOSE 94 111*  BUN 7* 8  CREATININE 0.70 0.75  CALCIUM 9.0 8.6*   PT/INR No results for input(s): LABPROT, INR in the last 72  hours. Comprehensive Metabolic Panel:    Component Value Date/Time   NA 137 02/07/2019 0424   NA 137 02/06/2019 0600   NA 139 03/20/2018 0902   NA 140 09/12/2017 0917   K 4.4 02/07/2019 0424   K 3.2 (L) 02/06/2019 0600   CL 104 02/07/2019 0424   CL 104 02/06/2019 0600   CO2 26 02/07/2019 0424   CO2 25 02/06/2019 0600   BUN 8 02/07/2019 0424   BUN 7 (L) 02/06/2019 0600   BUN 11 03/20/2018 0902   BUN 12 09/12/2017 0917   CREATININE 0.75 02/07/2019 0424   CREATININE 0.70 02/06/2019 0600   CREATININE 0.78 09/14/2016 0949   CREATININE 0.67 03/22/2016 1008   GLUCOSE 111 (H) 02/07/2019 0424   GLUCOSE 94 02/06/2019 0600   CALCIUM 8.6 (L) 02/07/2019 0424   CALCIUM 9.0 02/06/2019 0600   AST 22 09/22/2018 1832   ALT 17 09/22/2018 1832   ALKPHOS 93 09/22/2018 1832   BILITOT 0.4 09/22/2018 1832   PROT 7.5 09/22/2018 1832   ALBUMIN 4.4 09/22/2018 1832    Studies/Results: No results found.  Armandina Gemma 02/08/2019  Patient ID: Kristen Cruz, female   DOB: 1936/04/02, 83 y.o.   MRN: [id number]

## 2019-02-08 NOTE — Progress Notes (Signed)
Pharmacy: Re- Eliquis  Informed patient on 02/07/19 re: Eliquis dose changed to 2.5 mg bid and that this will be her new dose based on age and weight.  Patient verbalized understanding.  Dia Sitter, PharmD, BCPS 02/08/2019 8:40 AM

## 2019-02-09 ENCOUNTER — Encounter: Payer: Self-pay | Admitting: Oncology

## 2019-02-09 MED ORDER — APIXABAN 2.5 MG PO TABS: 2.5000 mg | ORAL_TABLET | Freq: Two times a day (BID) | ORAL | Status: DC

## 2019-02-09 NOTE — Plan of Care (Signed)
Pt.'s WBC were 11.6 on 2/19 and were 15.2 on 2/20. Pt has been running a low grade fever.

## 2019-02-09 NOTE — Discharge Summary (Addendum)
Physician Discharge Summary [facility]  Patient ID: Kristen Cruz MRN: [id number] DOB/AGE: Sep 16, 1936 83 y.o.  Admit date: 02/06/2019 Discharge date: 02/09/2019  Admission Diagnoses:  Mass in cecum  Discharge Diagnoses:  Principal Problem:   Mass of cecum Active Problems:   GI bleed   Colonic mass   Discharged Condition: good  Hospital Course: Patient was admitted for observation following colon surgery.  Post op course was complicated by brief arrhythmia which was evaluated by cardiology.  Pain was well controlled.  Tolerated diet.  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  Arrangements were made for medical oncology consultation as an out-patient.  Patient was prepared for discharge home on POD#3.  Consults: cardiology  Treatments: surgery: open right colectomy  Discharge Exam: Blood pressure 117/71, pulse 91, temperature 98.7 F (37.1 C), temperature source Oral, resp. rate 17, height 5\' 2"  (1.575 m), weight 41.9 kg, SpO2 99 %. HEENT - clear Neck - soft Chest - clear bilaterally Cor - RRR Abd - soft without distension; wound dry and intact with staples in place; active BS present  Disposition: Home  Discharge Instructions    Diet - low sodium heart healthy   Complete by:  As directed    Discharge instructions   Complete by:  As directed    [facility]  OPEN ABDOMINAL SURGERY: POST OP INSTRUCTIONS  Always review your discharge instruction sheet given to you by the facility where your surgery was performed.  A prescription for pain medication may be given to you upon discharge.  Take your pain medication as prescribed.  If narcotic pain medicine is not needed, then you may take acetaminophen (Tylenol) or ibuprofen (Advil) as needed. Take your usually prescribed medications unless otherwise directed. If you need a refill on your pain medication, please contact your pharmacy. They will  contact our office to request authorization.  Prescriptions will not be filled after 5 pm or on weekends. You should follow a light diet the first few days after arrival home, such as soup and crackers, unless your doctor has advised otherwise. A high-fiber, low fat diet can be resumed as tolerated.  Be sure to include plenty of fluids daily.  Most patients will experience some swelling and bruising in the area of the incision. Ice packs will help. Swelling and bruising can take several days to resolve. It is common to experience some constipation if taking pain medication after surgery.  Increasing fluid intake and taking a stool softener will usually help or prevent this problem from occurring.  A mild laxative (Milk of Magnesia or Miralax) should be taken according to package directions if there are no bowel movements after 48 hours.  You may have steri-strips (small skin tapes) in place directly over the incision.  These strips should be left on the skin for 5-7 days.  Any sutures or staples will be removed at the office during your follow-up visit. You may find that a light gauze bandage over your incision may keep your staples from being rubbed or pulled. You may shower and replace the bandage daily. ACTIVITIES:  You may resume regular (light) daily activities beginning the next day - such as daily self-care, walking, climbing stairs - gradually increasing activities as tolerated.  You may have sexual intercourse when it is comfortable.  Refrain from any heavy lifting or straining until approved by your doctor.  You may drive when you no longer are taking prescription pain medication,  you can comfortably wear a seatbelt, and you can safely maneuver your car and apply brakes. You should see your doctor in the office for a follow-up appointment approximately 2-3 weeks after your surgery.  Make sure that you call for this appointment within a day or two after you arrive home to insure a convenient  appointment time.  WHEN TO CALL YOUR DOCTOR: Fever greater than 101.0 Inability to urinate Persistent nausea and/or vomiting Extreme swelling or bruising Continued bleeding from incision Increased pain, redness, or drainage from the incision Difficulty swallowing or breathing Muscle cramping or spasms Numbness or tingling in hands or around lips  IF YOU HAVE DISABILITY OR FAMILY LEAVE FORMS, YOU MUST BRING THEM TO THE OFFICE FOR PROCESSING.  PLEASE DO NOT GIVE THEM TO YOUR DOCTOR.  The clinic staff is available to answer your questions during regular business hours.  Please don't hesitate to call and ask to speak to one of the nurses if you have concerns.  [facility] [place] Office: [phone]  For further questions, please visit [email or web address]   Increase activity slowly   Complete by:  As directed    No dressing needed   Complete by:  As directed       Follow-up Information    Armandina Gemma, MD. Schedule an appointment as soon as possible for a visit in 1 week(s).   Specialty:  General Surgery Why:  For staple removal Contact information: [place] [address] [phone]        Ladell Pier, MD. Schedule an appointment as soon as possible for a visit in 3 week(s).   Specialty:  Oncology Why:  Dr. Gearldine Shown office will call to schedule initial consultation. Contact information: [place] [address] [phone]           Tionne Dayhoff M. Bethania Schlotzhauer, MD, [facility] Office: [phone]   Signed: Armandina Gemma 02/09/2019, 7:53 AM

## 2019-02-09 NOTE — Progress Notes (Signed)
    Pt OK for discharge from cardiology standpoint. She has follow up arranged.   Daune Perch, AGNP-C [facility] 02/09/2019  3:47 PM

## 2019-02-09 NOTE — Care Management Important Message (Signed)
Important Message  Patient Details  Name: Kristen Cruz MRN: [id number] Date of Birth: [birth year]   Medicare Important Message Given:  Yes    Kerin Salen 02/09/2019, 3:45 [place] Message  Patient Details  Name: Kristen Cruz MRN: [id number] Date of Birth: [birth year]   Medicare Important Message Given:  Yes    Kerin Salen 02/09/2019, 3:45 PM

## 2019-02-14 ENCOUNTER — Encounter (HOSPITAL_COMMUNITY): Payer: Self-pay | Admitting: Emergency Medicine

## 2019-02-14 ENCOUNTER — Emergency Department (HOSPITAL_COMMUNITY): Payer: Medicare Other

## 2019-02-14 ENCOUNTER — Inpatient Hospital Stay (HOSPITAL_COMMUNITY)
Admission: EM | Admit: 2019-02-14 | Discharge: 2019-02-17 | DRG: 389 | Disposition: A | Discharge status: Home / Self Care | Payer: Medicare Other | Attending: General Surgery | Admitting: General Surgery

## 2019-02-14 ENCOUNTER — Other Ambulatory Visit: Payer: Self-pay

## 2019-02-14 DIAGNOSIS — Z8249 Family history of ischemic heart disease and other diseases of the circulatory system: Secondary | ICD-10-CM

## 2019-02-14 DIAGNOSIS — I10 Essential (primary) hypertension: Secondary | ICD-10-CM | POA: Diagnosis present

## 2019-02-14 DIAGNOSIS — Z888 Allergy status to other drugs, medicaments and biological substances status: Secondary | ICD-10-CM

## 2019-02-14 DIAGNOSIS — K56609 Unspecified intestinal obstruction, unspecified as to partial versus complete obstruction: Secondary | ICD-10-CM | POA: Diagnosis not present

## 2019-02-14 DIAGNOSIS — Z85038 Personal history of other malignant neoplasm of large intestine: Secondary | ICD-10-CM

## 2019-02-14 DIAGNOSIS — Z87442 Personal history of urinary calculi: Secondary | ICD-10-CM

## 2019-02-14 DIAGNOSIS — H9190 Unspecified hearing loss, unspecified ear: Secondary | ICD-10-CM | POA: Diagnosis present

## 2019-02-14 DIAGNOSIS — D509 Iron deficiency anemia, unspecified: Secondary | ICD-10-CM | POA: Diagnosis present

## 2019-02-14 DIAGNOSIS — Z882 Allergy status to sulfonamides status: Secondary | ICD-10-CM

## 2019-02-14 DIAGNOSIS — Z9842 Cataract extraction status, left eye: Secondary | ICD-10-CM

## 2019-02-14 DIAGNOSIS — K219 Gastro-esophageal reflux disease without esophagitis: Secondary | ICD-10-CM | POA: Diagnosis present

## 2019-02-14 DIAGNOSIS — Z79899 Other long term (current) drug therapy: Secondary | ICD-10-CM

## 2019-02-14 DIAGNOSIS — Z90710 Acquired absence of both cervix and uterus: Secondary | ICD-10-CM

## 2019-02-14 DIAGNOSIS — Z9049 Acquired absence of other specified parts of digestive tract: Secondary | ICD-10-CM

## 2019-02-14 DIAGNOSIS — E7800 Pure hypercholesterolemia, unspecified: Secondary | ICD-10-CM | POA: Diagnosis present

## 2019-02-14 DIAGNOSIS — Z7901 Long term (current) use of anticoagulants: Secondary | ICD-10-CM

## 2019-02-14 DIAGNOSIS — I4819 Other persistent atrial fibrillation: Secondary | ICD-10-CM | POA: Diagnosis present

## 2019-02-14 DIAGNOSIS — Z886 Allergy status to analgesic agent status: Secondary | ICD-10-CM

## 2019-02-14 DIAGNOSIS — Z9841 Cataract extraction status, right eye: Secondary | ICD-10-CM

## 2019-02-14 DIAGNOSIS — Z961 Presence of intraocular lens: Secondary | ICD-10-CM | POA: Diagnosis present

## 2019-02-14 LAB — CBC
HCT: 29.7 % — ABNORMAL LOW (ref 36.0–46.0)
Hemoglobin: 9.2 g/dL — ABNORMAL LOW (ref 12.0–15.0)
MCH: 28 pg (ref 26.0–34.0)
MCHC: 31 g/dL (ref 30.0–36.0)
MCV: 90.3 fL (ref 80.0–100.0)
Platelets: 505 10*3/uL — ABNORMAL HIGH (ref 150–400)
RBC: 3.29 MIL/uL — ABNORMAL LOW (ref 3.87–5.11)
RDW: 13 % (ref 11.5–15.5)
WBC: 12.4 10*3/uL — ABNORMAL HIGH (ref 4.0–10.5)
nRBC: 0 % (ref 0.0–0.2)

## 2019-02-14 LAB — COMPREHENSIVE METABOLIC PANEL
ALT: 13 U/L (ref 0–44)
AST: 15 U/L (ref 15–41)
Albumin: 3.4 g/dL — ABNORMAL LOW (ref 3.5–5.0)
Alkaline Phosphatase: 65 U/L (ref 38–126)
Anion gap: 7 (ref 5–15)
BUN: 18 mg/dL (ref 8–23)
CO2: 28 mmol/L (ref 22–32)
Calcium: 8.8 mg/dL — ABNORMAL LOW (ref 8.9–10.3)
Chloride: 101 mmol/L (ref 98–111)
Creatinine, Ser: 0.85 mg/dL (ref 0.44–1.00)
GFR calc Af Amer: >60 mL/min (ref >60)
GFR calc non Af Amer: >60 mL/min (ref >60)
Glucose, Bld: 137 mg/dL — ABNORMAL HIGH (ref 70–99)
Potassium: 3.7 mmol/L (ref 3.5–5.1)
Sodium: 136 mmol/L (ref 135–145)
Total Bilirubin: 0.2 mg/dL — ABNORMAL LOW (ref 0.3–1.2)
Total Protein: 6.6 g/dL (ref 6.5–8.1)

## 2019-02-14 LAB — URINALYSIS, ROUTINE W REFLEX MICROSCOPIC
Bilirubin Urine: NEGATIVE
Glucose, UA: NEGATIVE mg/dL
Ketones, ur: NEGATIVE mg/dL
Leukocytes,Ua: NEGATIVE
Nitrite: NEGATIVE
Protein, ur: 30 mg/dL — AB
Specific Gravity, Urine: 1.02 (ref 1.005–1.030)
pH: 5 (ref 5.0–8.0)

## 2019-02-14 LAB — LIPASE, BLOOD: Lipase: 43 U/L (ref 11–51)

## 2019-02-14 MED ORDER — ONDANSETRON HCL 4 MG/2ML IJ SOLN
4.0000 mg | Freq: Four times a day (QID) | INTRAMUSCULAR | Status: DC | PRN
  Administered 2019-02-15: 4 mg via INTRAVENOUS
  Filled 2019-02-14: qty 2

## 2019-02-14 MED ORDER — PANTOPRAZOLE SODIUM 40 MG IV SOLR
40.0000 mg | Freq: Every day | INTRAVENOUS | Status: DC
  Administered 2019-02-14 – 2019-02-16 (×3): 40 mg via INTRAVENOUS
  Filled 2019-02-14 (×3): qty 40

## 2019-02-14 MED ORDER — DIPHENHYDRAMINE HCL 50 MG/ML IJ SOLN: 12.5000 mg | Freq: Four times a day (QID) | INTRAMUSCULAR | Status: DC | PRN

## 2019-02-14 MED ORDER — SODIUM CHLORIDE (PF) 0.9 % IJ SOLN
INTRAMUSCULAR | Status: AC
  Filled 2019-02-14: qty 50

## 2019-02-14 MED ORDER — DIPHENHYDRAMINE HCL 12.5 MG/5ML PO ELIX: 12.5000 mg | ORAL_SOLUTION | Freq: Four times a day (QID) | ORAL | Status: DC | PRN

## 2019-02-14 MED ORDER — MORPHINE SULFATE (PF) 2 MG/ML IV SOLN: 1.0000 mg | INTRAVENOUS | Status: DC | PRN

## 2019-02-14 MED ORDER — ONDANSETRON 4 MG PO TBDP: 4.0000 mg | ORAL_TABLET | Freq: Four times a day (QID) | ORAL | Status: DC | PRN

## 2019-02-14 MED ORDER — SODIUM CHLORIDE 0.9 % IV BOLUS (SEPSIS)
500.0000 mL | Freq: Once | INTRAVENOUS | Status: AC
  Administered 2019-02-14: 500 mL via INTRAVENOUS

## 2019-02-14 MED ORDER — METHOCARBAMOL 1000 MG/10ML IJ SOLN
500.0000 mg | Freq: Four times a day (QID) | INTRAVENOUS | Status: DC | PRN
  Filled 2019-02-14: qty 5

## 2019-02-14 MED ORDER — ACETAMINOPHEN 650 MG RE SUPP: 650.0000 mg | Freq: Four times a day (QID) | RECTAL | Status: DC | PRN

## 2019-02-14 MED ORDER — SIMETHICONE 80 MG PO CHEW
40.0000 mg | CHEWABLE_TABLET | Freq: Four times a day (QID) | ORAL | Status: DC | PRN
  Filled 2019-02-14: qty 1

## 2019-02-14 MED ORDER — POTASSIUM CHLORIDE IN NACL 20-0.9 MEQ/L-% IV SOLN
INTRAVENOUS | Status: DC
  Administered 2019-02-14 – 2019-02-15 (×3): via INTRAVENOUS
  Filled 2019-02-14 (×3): qty 1000

## 2019-02-14 MED ORDER — LIDOCAINE HCL URETHRAL/MUCOSAL 2 % EX GEL
CUTANEOUS | Status: AC
  Filled 2019-02-14: qty 5

## 2019-02-14 MED ORDER — FAMOTIDINE IN NACL 20-0.9 MG/50ML-% IV SOLN
20.0000 mg | Freq: Once | INTRAVENOUS | Status: AC
  Administered 2019-02-14: 20 mg via INTRAVENOUS
  Filled 2019-02-14: qty 50

## 2019-02-14 MED ORDER — LIDOCAINE HCL URETHRAL/MUCOSAL 2 % EX GEL
1.0000 "application " | Freq: Once | CUTANEOUS | Status: AC
  Administered 2019-02-14: 1

## 2019-02-14 MED ORDER — MORPHINE SULFATE (PF) 4 MG/ML IV SOLN
4.0000 mg | Freq: Once | INTRAVENOUS | Status: DC
  Filled 2019-02-14: qty 1

## 2019-02-14 MED ORDER — SODIUM CHLORIDE 0.9 % IV SOLN
1000.0000 mL | INTRAVENOUS | Status: DC
  Administered 2019-02-14: 1000 mL via INTRAVENOUS

## 2019-02-14 MED ORDER — ONDANSETRON HCL 4 MG/2ML IJ SOLN
4.0000 mg | Freq: Once | INTRAMUSCULAR | Status: AC
  Administered 2019-02-14: 4 mg via INTRAVENOUS
  Filled 2019-02-14: qty 2

## 2019-02-14 MED ORDER — IOHEXOL 300 MG/ML  SOLN
75.0000 mL | Freq: Once | INTRAMUSCULAR | Status: AC | PRN
  Administered 2019-02-14: 75 mL via INTRAVENOUS

## 2019-02-14 MED ORDER — HEPARIN SODIUM (PORCINE) 5000 UNIT/ML IJ SOLN
5000.0000 [IU] | Freq: Three times a day (TID) | INTRAMUSCULAR | Status: DC
  Administered 2019-02-14 – 2019-02-16 (×7): 5000 [IU] via SUBCUTANEOUS
  Filled 2019-02-14 (×8): qty 1

## 2019-02-14 MED ORDER — ACETAMINOPHEN 325 MG PO TABS: 650.0000 mg | ORAL_TABLET | Freq: Four times a day (QID) | ORAL | Status: DC | PRN

## 2019-02-14 MED ORDER — METOPROLOL TARTRATE 5 MG/5ML IV SOLN: 5.0000 mg | Freq: Four times a day (QID) | INTRAVENOUS | Status: DC | PRN

## 2019-02-14 NOTE — ED Provider Notes (Signed)
[facility] DEPT Provider Note   CSN: [id number] Arrival date & time: 02/14/19  8502    History   Chief Complaint Chief Complaint  Patient presents with  . Nausea  . Emesis  . Diarrhea  . Post-op Problem    tuesday removed a mass from cecum     HPI Kristen Cruz is a 83 y.o. female.     HPI Pt started having vomiting last night.  She had too many to count throughout the night.  She then started to feel acid sensation in her throat throughout the night.  No BM since yesterday.  She had 2 loose stools in the morning yesterday.  No abdominal pain but she feels bloated.  She feels like she has gas that has to be relieved.  She tried some medications for nausea and acid reflux yesterday without much relief.  Pt had surgery  On her colon a week ago. Past Medical History:  Diagnosis Date  . Anticoagulant long-term use    eliquis  . Arthritis   . Cecum mass   . History of GI bleed 09/22/2018   upper  . HOH (hard of hearing)   . Hypertension   . IDA (iron deficiency anemia)   . Persistent atrial fibrillation cardiologist-- dr Radford Pax   CHADS2VASC score is 4 on Apixaban  . PONV (postoperative nausea and vomiting)   . Rash    "allergic to my on body tempurature , if I have fever I break out in a rash"  . Right nephrolithiasis    per CT 09-22-2018  . Wears dentures    upper  . Wears glasses     Patient Active Problem List   Diagnosis Date Noted  . Colonic mass 02/06/2019  . Mass of cecum 02/05/2019  . Low serum vitamin B12 12/17/2018  . GI bleed 09/22/2018  . Symptomatic anemia 09/22/2018  . Acute GI bleeding 09/22/2018  . On apixaban therapy   . Incomplete RBBB 02/27/2015  . Sinus tachycardia 02/20/2014  . Heart murmur 02/20/2014  . Persistent atrial fibrillation ([place])   . Essential hypertension, benign 02/19/2014  . Pure hypercholesterolemia 02/19/2014    Past Surgical History:  Procedure Laterality Date  . ABDOMINAL  HYSTERECTOMY  1978   ovaries remain  . BREAST CYST EXCISION Right yrs ago   benign  . CATARACT EXTRACTION W/ INTRAOCULAR LENS  IMPLANT, BILATERAL  2014  approx.  . COLONOSCOPY  01-22-2019   dr Watt Climes  . PARTIAL COLECTOMY Right 02/06/2019   Procedure: OPEN RIGHT COLECTOMY;  Surgeon: Armandina Gemma, MD;  Location: [facility] ORS;  Service: General;  Laterality: Right;     OB History   No obstetric history on file.      Home Medications    Prior to Admission medications   Medication Sig Start Date End Date Taking? Authorizing Provider  apixaban (ELIQUIS) 2.5 MG TABS tablet Take 1 tablet (2.5 mg total) by mouth 2 (two) times daily. 02/07/19   Turner, Eber Hong, MD  DILT-XR 180 MG 24 hr capsule TAKE 1 CAPSULE BY MOUTH EVERY DAY Patient taking differently: Take 180 mg by mouth daily.  09/05/18   Sueanne Margarita, MD  ferrous sulfate 325 (65 FE) MG EC tablet Take 1 tablet (325 mg total) by mouth 2 (two) times daily. Patient taking differently: Take 325 mg by mouth daily.  09/23/18 09/23/19  Georgette Shell, MD  GAVILYTE-N WITH FLAVOR PACK 420 g solution Take 420 g by mouth once. 01/19/19  [provider]  Polyvinyl Alcohol-Povidone (REFRESH OP) Apply 2 drops to eye 2 (two) times daily as needed (dry eyes).     [provider]  vitamin B-12 (CYANOCOBALAMIN) 1000 MCG tablet Take 1,000 mcg by mouth daily.    [provider]  vitamin C (ASCORBIC ACID) 250 MG tablet Take 1 tablet (250 mg total) by mouth daily. 09/23/18   Georgette Shell, MD    Family History Family History  Problem Relation Age of Onset  . Hypertension Mother   . Heart Problems Mother   . Heart attack Mother   . Cancer Sister   . Cancer Brother     Social History Social History   Tobacco Use  . Smoking status: Never Smoker  . Smokeless tobacco: Never Used  Substance Use Topics  . Alcohol use: No  . Drug use: Never     Allergies   Asa [aspirin]; Bentyl [dicyclomine hcl]; Sucrets [dyclonine];  Sulfa antibiotics; Tetracycline hcl; and Tylenol [acetaminophen]   Review of Systems Review of Systems  Constitutional: Negative for fever.  Genitourinary: Negative for dysuria and urgency.  All other systems reviewed and are negative.    Physical Exam Updated Vital Signs BP 126/79 (BP Location: Left Arm)   Pulse 92   Temp 98 F (36.7 C) (Oral)   Resp 16   Ht 1.588 m (5' 2.5")   Wt 41.7 kg   SpO2 100%   BMI 16.56 kg/m   Physical Exam Vitals signs and nursing note reviewed.  Constitutional:      General: She is not in acute distress.    Appearance: She is well-developed.  HENT:     Head: Normocephalic and atraumatic.     Right Ear: External ear normal.     Left Ear: External ear normal.  Eyes:     General: No scleral icterus.       Right eye: No discharge.        Left eye: No discharge.     Conjunctiva/sclera: Conjunctivae normal.  Neck:     Musculoskeletal: Neck supple.     Trachea: No tracheal deviation.  Cardiovascular:     Rate and Rhythm: Normal rate and regular rhythm.  Pulmonary:     Effort: Pulmonary effort is normal. No respiratory distress.     Breath sounds: Normal breath sounds. No stridor. No wheezing or rales.  Abdominal:     General: A surgical scar is present. Bowel sounds are normal. There is distension.     Palpations: Abdomen is soft.     Tenderness: There is abdominal tenderness in the right lower quadrant, suprapubic area and left lower quadrant. There is no guarding or rebound.     Comments: No erythema or drainage around her surgical scar  Musculoskeletal:        General: No tenderness.  Skin:    General: Skin is warm and dry.     Findings: No rash.  Neurological:     Mental Status: She is alert.     Cranial Nerves: No cranial nerve deficit (no facial droop, extraocular movements intact, no slurred speech).     Sensory: No sensory deficit.     Motor: No abnormal muscle tone or seizure activity.     Coordination: Coordination normal.        ED Treatments / Results  Labs (all labs ordered are listed, but only abnormal results are displayed) Labs Reviewed  COMPREHENSIVE METABOLIC PANEL - Abnormal; Notable for the following components:  Result Value   Glucose, Bld 137 (*)    Calcium 8.8 (*)    Albumin 3.4 (*)    Total Bilirubin 0.2 (*)    All other components within normal limits  CBC - Abnormal; Notable for the following components:   WBC 12.4 (*)    RBC 3.29 (*)    Hemoglobin 9.2 (*)    HCT 29.7 (*)    Platelets 505 (*)    All other components within normal limits  URINALYSIS, ROUTINE W REFLEX MICROSCOPIC - Abnormal; Notable for the following components:   APPearance CLOUDY (*)    Hgb urine dipstick SMALL (*)    Protein, ur 30 (*)    Bacteria, UA RARE (*)    All other components within normal limits  LIPASE, BLOOD    EKG EKG Interpretation  Date/Time:  Wednesday February 14 2019 06:46:13 EST Ventricular Rate:  91 PR Interval:    QRS Duration: 74 QT Interval:  341 QTC Calculation: 420 R Axis:   -43 Text Interpretation:  Sinus rhythm Probable left atrial enlargement Left anterior fascicular block RSR' in V1 or V2, right VCD or RVH No significant change since last tracing 06 Feb 2019 Confirmed by Rolland Porter [phone]) on 02/14/2019 6:51:19 AM Also confirmed by Rolland Porter [phone]), editor Philomena Doheny [phone])  on 02/14/2019 8:02:46 AM   Radiology Dg Abdomen Acute W/chest  Result Date: 02/14/2019 CLINICAL DATA:  Abdominal pain and distention. Diarrhea. Colon cancer with partial colectomy 02/06/2019 EXAM: DG ABDOMEN ACUTE W/ 1V CHEST COMPARISON:  CT abdomen pelvis 09/22/2018 FINDINGS: Cardiac enlargement without heart failure. Mild apical scarring bilaterally and mild left lower lobe scarring. Negative for pneumonia. Negative for pleural effusion Dilated small bowel loops in the abdomen, centered in the mid abdomen. These contain air-fluid levels on the upright view. Colon decompressed. No free air. Lumbar  levoscoliosis with degenerative change IMPRESSION: Dilated small bowel loops with air-fluid levels consistent with small-bowel obstruction. Colon decompressed No acute cardiopulmonary abnormality. Electronically Signed   By: Franchot Gallo M.D.   On: 02/14/2019 08:39    Procedures Procedures (including critical care time)  Medications Ordered in ED Medications  sodium chloride 0.9 % bolus 500 mL (0 mLs Intravenous Stopped 02/14/19 0808)    Followed by  0.9 %  sodium chloride infusion (1,000 mLs Intravenous New Bag/Given 02/14/19 0738)  lidocaine (XYLOCAINE) 2 % jelly (has no administration in time range)  ondansetron (ZOFRAN) injection 4 mg (4 mg Intravenous Given 02/14/19 0738)  famotidine (PEPCID) IVPB 20 mg premix (20 mg Intravenous New Bag/Given 02/14/19 0815)     Initial Impression / Assessment and Plan / ED Course  I have reviewed the triage vital signs and the nursing notes.  Pertinent labs & imaging results that were available during my care of the patient were reviewed by me and considered in my medical decision making (see chart for details).  Clinical Course as of Feb 15 932  Wed Feb 14, 2019  0740 Pt does not want pain meds right now, just the antacids.   [JK]  0737 Labs similar to previous values.  X-rays concerning for bowel obstruction.  Discussed possible need for NG tube.  Patient is currently comfortable and not vomiting.  I will consult with general surgery   [JK]    Clinical Course User Index [JK] Dorie Rank, MD     Patient presented to the emergency room for evaluation of abdominal pain after recent surgery.  Patient was also complaining of nausea vomiting.  Symptoms  were concerning for the possibility of bowel obstruction.  X-rays findings do suggest small bowel obstruction.  I discussed the case with general surgery.  NG tube has been ordered.  Concerning her leukocytosis CT scan has been ordered to evaluate for any complications associated with her surgery other  than the bowel obstruction.  Plan on admission to the hospital for further treatment.  Final Clinical Impressions(s) / ED Diagnoses   Final diagnoses:  SBO (small bowel obstruction) (HCC)       Dorie Rank, MD 02/14/19 [phone]

## 2019-02-14 NOTE — ED Triage Notes (Signed)
Pt comes to ed via [facility], c/o NVD x 2 days had surgery last Tuesday with Norco. Pt has hx of hemrohoid. V/s on arrival 129/74 pluse 97, r14, spo2 99 room air.  Pt had a mass removed from cecum. Pt was on eliqus but stopped before surgery. Hx of Afib. 20 in [place].

## 2019-02-14 NOTE — Progress Notes (Signed)
Called ED to get report [id number]. Put on hold for 10 mintues.

## 2019-02-14 NOTE — ED Notes (Signed)
ED TO INPATIENT HANDOFF REPORT  Name/Age/Gender Kristen Cruz 83 y.o. female  Code Status    Code Status Orders  (From admission, onward)         Start     Ordered   02/14/19 1154  Full code  Continuous     02/14/19 1159        Code Status History    Date Active Date Inactive Code Status Order ID Comments User Context   02/06/2019 1104 02/09/2019 1940 Full Code [id number]  Armandina Gemma, MD Inpatient   09/22/2018 2315 09/23/2018 1850 Full Code [id number]  Rise Patience, MD Inpatient    Advance Directive Documentation     Most Recent Value  Type of Advance Directive  Healthcare Power of Attorney  Pre-existing out of facility DNR order (yellow form or pink MOST form)  -  "MOST" Form in Place?  -      Home/SNF/Other Home  Chief Complaint nausea  Level of Care/Admitting Diagnosis ED Disposition    ED Disposition Condition [place]: [place] [[id number]]  Level of Care: Med-Surg [16]  Diagnosis: SBO (small bowel obstruction) [facility]) [[id number]]  Admitting Physician: Armandina Gemma Lorry.Blower  Attending Physician: Deborah Chalk  Bed request comments: 5 west  PT Class (Do Not Modify): Observation [104]  PT Acc Code (Do Not Modify): Observation [10022]       Medical History Past Medical History:  Diagnosis Date  . Anticoagulant long-term use    eliquis  . Arthritis   . Cecum mass   . History of GI bleed 09/22/2018   upper  . HOH (hard of hearing)   . Hypertension   . IDA (iron deficiency anemia)   . Persistent atrial fibrillation cardiologist-- dr Radford Pax   CHADS2VASC score is 4 on Apixaban  . PONV (postoperative nausea and vomiting)   . Rash    "allergic to my on body tempurature , if I have fever I break out in a rash"  . Right nephrolithiasis    per CT 09-22-2018  . Wears dentures    upper  . Wears glasses     Allergies Allergies  Allergen Reactions  . Asa [Aspirin] Rash  . Bentyl [Dicyclomine Hcl]  Rash  . Sucrets [Dyclonine] Rash  . Sulfa Antibiotics Rash  . Tetracycline Hcl Rash  . Tylenol [Acetaminophen] Rash    IV Location/Drains/Wounds Patient Lines/Drains/Airways Status   Active Line/Drains/Airways    Name:   Placement date:   Placement time:   Site:   Days:   Peripheral IV 02/06/19 Left Forearm   02/06/19    0557    Forearm   8   Peripheral IV 02/14/19 Left Antecubital   02/14/19    0628    Antecubital   less than 1   NG/OG Tube Nasogastric 16 Fr. Right nare Aucultation   02/14/19    1046    Right nare   less than 1   Incision (Closed) 02/06/19 Abdomen Right   02/06/19    0907     8          Labs/Imaging Results for orders placed or performed during the hospital encounter of 02/14/19 (from the past 48 hour(s))  Lipase, blood     Status: None   Collection Time: 02/14/19  6:39 AM  Result Value Ref Range   Lipase 43 11 - 51 U/L    Comment: Performed at [facility], [address] [place],  [place] [address]  Comprehensive metabolic panel     Status: Abnormal   Collection Time: 02/14/19  6:39 AM  Result Value Ref Range   Sodium 136 135 - 145 mmol/L   Potassium 3.7 3.5 - 5.1 mmol/L   Chloride 101 98 - 111 mmol/L   CO2 28 22 - 32 mmol/L   Glucose, Bld 137 (H) 70 - 99 mg/dL   BUN 18 8 - 23 mg/dL   Creatinine, Ser 0.85 0.44 - 1.00 mg/dL   Calcium 8.8 (L) 8.9 - 10.3 mg/dL   Total Protein 6.6 6.5 - 8.1 g/dL   Albumin 3.4 (L) 3.5 - 5.0 g/dL   AST 15 15 - 41 U/L   ALT 13 0 - 44 U/L   Alkaline Phosphatase 65 38 - 126 U/L   Total Bilirubin 0.2 (L) 0.3 - 1.2 mg/dL   GFR calc non Af Amer >60 >60 mL/min   GFR calc Af Amer >60 >60 mL/min   Anion gap 7 5 - 15    Comment: Performed at [facility], [place] [address], [place], [place] [address]  CBC     Status: Abnormal   Collection Time: 02/14/19  6:39 AM  Result Value Ref Range   WBC 12.4 (H) 4.0 - 10.5 K/uL   RBC 3.29 (L) 3.87 - 5.11 MIL/uL   Hemoglobin 9.2 (L) 12.0 - 15.0 g/dL    HCT 29.7 (L) 36.0 - 46.0 %   MCV 90.3 80.0 - 100.0 fL   MCH 28.0 26.0 - 34.0 pg   MCHC 31.0 30.0 - 36.0 g/dL   RDW 13.0 11.5 - 15.5 %   Platelets 505 (H) 150 - 400 K/uL   nRBC 0.0 0.0 - 0.2 %    Comment: Performed at [facility], [place] [address], [place], [place] [address]  Urinalysis, Routine w reflex microscopic     Status: Abnormal   Collection Time: 02/14/19  6:39 AM  Result Value Ref Range   Color, Urine YELLOW YELLOW   APPearance CLOUDY (A) CLEAR   Specific Gravity, Urine 1.020 1.005 - 1.030   pH 5.0 5.0 - 8.0   Glucose, UA NEGATIVE NEGATIVE mg/dL   Hgb urine dipstick SMALL (A) NEGATIVE   Bilirubin Urine NEGATIVE NEGATIVE   Ketones, ur NEGATIVE NEGATIVE mg/dL   Protein, ur 30 (A) NEGATIVE mg/dL   Nitrite NEGATIVE NEGATIVE   Leukocytes,Ua NEGATIVE NEGATIVE   RBC / HPF 0-5 0 - 5 RBC/hpf   WBC, UA 0-5 0 - 5 WBC/hpf   Bacteria, UA RARE (A) NONE SEEN   Squamous Epithelial / LPF 0-5 0 - 5   Mucus PRESENT     Comment: Performed at [facility], [place] [address] [place], [place] [address]   Dg Abdomen 1 View  Result Date: 02/14/2019 CLINICAL DATA:  Status post enteric tube placement. EXAM: ABDOMEN - 1 VIEW COMPARISON:  02/14/2019 FINDINGS: The ET tube tip is below the level of the GE junction within the expected location of the body of stomach. Side port is just below the GE junction. Dilated loops of small bowel are identified, not significantly changed from previous exam. IMPRESSION: 1. The enteric tube tip is in the expected location of the body of stomach. 2. No change in small bowel dilatation. Electronically Signed   By: Kerby Moors M.D.   On: 02/14/2019 12:40   Ct Abdomen Pelvis W Contrast  Result Date: 02/14/2019 CLINICAL DATA:  Recent colectomy for colon cancer. (02/06/2019). Nausea and  vomiting. EXAM: CT ABDOMEN AND PELVIS WITH CONTRAST TECHNIQUE: Multidetector CT imaging of the abdomen and pelvis was performed using the standard  protocol following bolus administration of intravenous contrast. CONTRAST:  31mL OMNIPAQUE IOHEXOL 300 MG/ML  SOLN COMPARISON:  Plain film 02/14/2019, CT 09/22/2018 FINDINGS: Lower chest: Mild linear basilar atelectasis. Hepatobiliary: Several low-density lesions in liver favored small benign cysts are too small to characterize. These are not changed from comparison exam. Portal veins are patent. Gallbladder normal. Common bile duct normal Pancreas: No pancreatic inflammation Spleen: Normal spleen Adrenals/urinary tract: Adrenal glands and kidneys are normal. The ureters and bladder normal. Stomach/Bowel: Mild thickening of the distal esophagus. Stomach is normal. There is fluid in the duodenum. The proximal small bowel is fluid-filled and dilated to 3.5 cm. This distension occurs over a large portion of the small bowel with no clear transition point identified. Partial RIGHT hemicolectomy anatomy. There is a 3 cm rounded fluid collection adjacent anastomosis which could is favored redundant bowel associated with the anastomosis. The colon is collapsed. No intraperitoneal free air.  No pneumatosis. Vascular/Lymphatic: Abdominal aorta is normal caliber. No periportal or retroperitoneal adenopathy. No pelvic adenopathy. Reproductive: Post hysterectomy. Other: Moderate free fluid the pelvis. Subcutaneous gas along the RIGHT abdominal wall wound appears non complicated. Musculoskeletal: No aggressive osseous lesion. IMPRESSION: 1. Majority of the small bowel is distended with gas and fluid. No clear transition point is identified. Differential includes small bowel obstruction versus severe ileus. Colon decompressed. 2. Partial RIGHT hemicolectomy anatomy without clear complication. 3. Small amount free fluid in the abdomen and pelvis. 4. No pneumatosis or intraperitoneal free air. Electronically Signed   By: Suzy Bouchard M.D.   On: 02/14/2019 10:59   Dg Abdomen Acute W/chest  Result Date: 02/14/2019 CLINICAL  DATA:  Abdominal pain and distention. Diarrhea. Colon cancer with partial colectomy 02/06/2019 EXAM: DG ABDOMEN ACUTE W/ 1V CHEST COMPARISON:  CT abdomen pelvis 09/22/2018 FINDINGS: Cardiac enlargement without heart failure. Mild apical scarring bilaterally and mild left lower lobe scarring. Negative for pneumonia. Negative for pleural effusion Dilated small bowel loops in the abdomen, centered in the mid abdomen. These contain air-fluid levels on the upright view. Colon decompressed. No free air. Lumbar levoscoliosis with degenerative change IMPRESSION: Dilated small bowel loops with air-fluid levels consistent with small-bowel obstruction. Colon decompressed No acute cardiopulmonary abnormality. Electronically Signed   By: Franchot Gallo M.D.   On: 02/14/2019 08:39   EKG Interpretation  Date/Time:  Wednesday February 14 2019 06:46:13 EST Ventricular Rate:  91 PR Interval:    QRS Duration: 74 QT Interval:  341 QTC Calculation: 420 R Axis:   -43 Text Interpretation:  Sinus rhythm Probable left atrial enlargement Left anterior fascicular block RSR' in V1 or V2, right VCD or RVH No significant change since last tracing 06 Feb 2019 Confirmed by Rolland Porter [phone]) on 02/14/2019 6:51:19 AM Also confirmed by Rolland Porter [phone]), editor Philomena Doheny [phone])  on 02/14/2019 8:02:46 AM   Pending Labs Unresulted Labs (From admission, onward)    Start     Ordered   02/15/19 9390  Basic metabolic panel  Tomorrow morning,   R     02/14/19 1159   02/15/19 0500  CBC  Tomorrow morning,   R     02/14/19 1159   02/14/19 1155  Creatinine, serum  (heparin)  Once,   R    Comments:  Baseline for heparin therapy IF NOT ALREADY DRAWN.    02/14/19 1159  Vitals/Pain Today's Vitals   02/14/19 0623 02/14/19 1048  BP: 126/79 135/74  Pulse: 92 85  Resp: 16 16  Temp: 98 F (36.7 C)   TempSrc: Oral   SpO2: 100% 99%  Weight: 41.7 kg   Height: 5' 2.5" (1.588 m)     Isolation Precautions No active  isolations  Medications Medications  sodium chloride (PF) 0.9 % injection (has no administration in time range)  heparin injection 5,000 Units (has no administration in time range)  metoprolol tartrate (LOPRESSOR) injection 5 mg (has no administration in time range)  pantoprazole (PROTONIX) injection 40 mg (has no administration in time range)  simethicone (MYLICON) chewable tablet 40 mg (has no administration in time range)  ondansetron (ZOFRAN-ODT) disintegrating tablet 4 mg (has no administration in time range)    Or  ondansetron (ZOFRAN) injection 4 mg (has no administration in time range)  diphenhydrAMINE (BENADRYL) 12.5 MG/5ML elixir 12.5 mg (has no administration in time range)    Or  diphenhydrAMINE (BENADRYL) injection 12.5 mg (has no administration in time range)  morphine 2 MG/ML injection 1-2 mg (has no administration in time range)  acetaminophen (TYLENOL) tablet 650 mg (has no administration in time range)    Or  acetaminophen (TYLENOL) suppository 650 mg (has no administration in time range)  methocarbamol (ROBAXIN) 500 mg in dextrose 5 % 50 mL IVPB (has no administration in time range)  0.9 % NaCl with KCl 20 mEq/ L  infusion ( Intravenous New Bag/Given 02/14/19 1347)  ondansetron (ZOFRAN) injection 4 mg (4 mg Intravenous Given 02/14/19 0738)  sodium chloride 0.9 % bolus 500 mL (0 mLs Intravenous Stopped 02/14/19 0808)  famotidine (PEPCID) IVPB 20 mg premix (0 mg Intravenous Stopped 02/14/19 0845)  iohexol (OMNIPAQUE) 300 MG/ML solution 75 mL (75 mLs Intravenous Contrast Given 02/14/19 0945)  lidocaine (XYLOCAINE) 2 % jelly 1 application (1 application Other Given 02/14/19 1002)    Mobility walks

## 2019-02-14 NOTE — ED Notes (Signed)
On blood thinner blue tube sent down  Urine culture sent down

## 2019-02-14 NOTE — ED Notes (Signed)
Bed: KP54 Expected date: 02/14/19 Expected time:  Means of arrival:  Comments: EMS Elderly colon cancer pt

## 2019-02-14 NOTE — H&P (Signed)
[facility] Admission Note  CARIGAN LISTER 16-May-1936  [id number].    Requesting MD: Dorie Rank Chief Complaint/Reason for Consult: SBO  HPI:  Kristen Cruz is an 83yo female s/p right colectomy 02/06/2019 by Dr. 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 States that she had been doing very well since surgery until last night. She had two episodes of loose stools yesterday morning. No BM or flatus today. She denies any abdominal pain but has bloating. Multiple episodes of nausea/vomiting and acid reflux. She tried OTC medications for reflux but this did not help so she came to the ED. Denies fever, chills, chest pain, shortness of breath, cough, dysuria.  ED workup included CT scan which shows majority of the small bowel is distended with gas and fluid, no clear transition point, no pneumatosis or free air. WBC 12.4, creatinine 0.85, electrolytes WNL.  Of note, patient was scheduled to see Dr. Harlow Asa in our office today for staple removal.  Anticoagulants: eliquis (last dose 12/25 at 1200) Lives at home with husband Ambulates without assistive device  ROS: Review of Systems  Constitutional: Negative.  Negative for chills and fever.  HENT: Negative.   Eyes: Negative.   Respiratory: Negative.  Negative for cough and shortness of breath.   Cardiovascular: Negative.  Negative for chest pain.  Gastrointestinal: Positive for constipation, diarrhea, heartburn, nausea and vomiting. Negative for abdominal pain.       Abdominal bloating  Genitourinary: Negative.   Musculoskeletal: Negative.   Skin: Negative.   Neurological: Negative.    All systems reviewed and otherwise negative except for as above  Family History  Problem Relation Age of Onset  . Hypertension Mother   . Heart  Problems Mother   . Heart attack Mother   . Cancer Sister   . Cancer Brother     Past Medical History:  Diagnosis Date  . Anticoagulant long-term use    eliquis  . Arthritis   . Cecum mass   . History of GI bleed 09/22/2018   upper  . HOH (hard of hearing)   . Hypertension   . IDA (iron deficiency anemia)   . Persistent atrial fibrillation cardiologist-- dr Radford Pax   CHADS2VASC score is 4 on Apixaban  . PONV (postoperative nausea and vomiting)   . Rash    "allergic to my on body tempurature , if I have fever I break out in a rash"  . Right nephrolithiasis    per CT 09-22-2018  . Wears dentures    upper  . Wears glasses     Past Surgical History:  Procedure Laterality Date  . ABDOMINAL HYSTERECTOMY  1978   ovaries remain  . BREAST CYST EXCISION Right yrs ago   benign  . CATARACT EXTRACTION W/ INTRAOCULAR LENS  IMPLANT, BILATERAL  2014  approx.  . COLONOSCOPY  01-22-2019   dr Watt Climes  . PARTIAL COLECTOMY Right 02/06/2019   Procedure: OPEN RIGHT COLECTOMY;  Surgeon: Armandina Gemma, MD;  Location: [facility];  Service: General;  Laterality: Right;    Social History:  reports that she has never smoked. She has never used smokeless tobacco. She reports that she does not drink alcohol or use drugs.  Allergies:  Allergies  Allergen Reactions  . Asa [Aspirin] Rash  . Bentyl [Dicyclomine Hcl] Rash  . Sucrets [Dyclonine] Rash  . Sulfa  Antibiotics Rash  . Tetracycline Hcl Rash  . Tylenol [Acetaminophen] Rash    (Not in a hospital admission)   Prior to Admission medications   Medication Sig Start Date End Date Taking? Authorizing Provider  acetaminophen (TYLENOL) 325 MG tablet Take 162.5 mg by mouth every 6 (six) hours as needed for mild pain or headache.   Yes [provider]  apixaban (ELIQUIS) 2.5 MG TABS tablet Take 1 tablet (2.5 mg total) by mouth 2 (two) times daily. 02/07/19  Yes Turner, Traci R, MD  DILT-XR 180 MG 24 hr capsule TAKE 1 CAPSULE BY MOUTH EVERY  DAY Patient taking differently: Take 180 mg by mouth daily.  09/05/18  Yes Turner, Eber Hong, MD  ferrous sulfate 325 (65 FE) MG EC tablet Take 1 tablet (325 mg total) by mouth 2 (two) times daily. Patient taking differently: Take 325 mg by mouth daily.  09/23/18 09/23/19 Yes Georgette Shell, MD  GAVILYTE-N WITH FLAVOR PACK 420 g solution Take 420 g by mouth once. 01/19/19  Yes [provider]  ondansetron (ZOFRAN-ODT) 4 MG disintegrating tablet Take 4 mg by mouth every 6 (six) hours as needed for nausea or vomiting.   Yes [provider]  Polyvinyl Alcohol-Povidone (REFRESH OP) Apply 2 drops to eye 2 (two) times daily as needed (dry eyes).    Yes [provider]  vitamin B-12 (CYANOCOBALAMIN) 1000 MCG tablet Take 1,000 mcg by mouth daily.   Yes [provider]  vitamin C (ASCORBIC ACID) 250 MG tablet Take 1 tablet (250 mg total) by mouth daily. 09/23/18  Yes Georgette Shell, MD    Blood pressure 135/74, pulse 85, temperature 98 F (36.7 C), temperature source Oral, resp. rate 16, height 5' 2.5" (1.588 m), weight 41.7 kg, SpO2 99 %. Physical Exam: General: pleasant, WD/WN white female who is laying in bed in NAD HEENT: head is normocephalic, atraumatic.  Sclera are noninjected.  Pupils equal and round.  Ears and nose without any masses or lesions.  Mouth is pink and moist. Dentition fair Heart: regular, rate, and rhythm.  No obvious murmurs, gallops, or rubs noted.  Palpable pedal pulses bilaterally Lungs: CTAB, no wheezes, rhonchi, or rales noted.  Respiratory effort nonlabored Abd: Transverse right mid abdominal incision cdi with staples intact and no erythema or drainage, soft, mild distension, few BS heard, no masses, hernias, or organomegaly. nontender MS: all 4 extremities are symmetrical with no cyanosis, clubbing, or edema. Skin: warm and dry with no masses, lesions, or rashes Psych: A&Ox3 with an appropriate affect Neuro: cranial nerves grossly  intact, extremity CSM intact bilaterally, normal speech  Results for orders placed or performed during the hospital encounter of 02/14/19 (from the past 48 hour(s))  Lipase, blood     Status: None   Collection Time: 02/14/19  6:39 AM  Result Value Ref Range   Lipase 43 11 - 51 U/L    Comment: Performed at [facility], [place] [address], [place], [place] [address]  Comprehensive metabolic panel     Status: Abnormal   Collection Time: 02/14/19  6:39 AM  Result Value Ref Range   Sodium 136 135 - 145 mmol/L   Potassium 3.7 3.5 - 5.1 mmol/L   Chloride 101 98 - 111 mmol/L   CO2 28 22 - 32 mmol/L   Glucose, Bld 137 (H) 70 - 99 mg/dL   BUN 18 8 - 23 mg/dL   Creatinine, Ser 0.85 0.44 - 1.00 mg/dL   Calcium 8.8 (  L) 8.9 - 10.3 mg/dL   Total Protein 6.6 6.5 - 8.1 g/dL   Albumin 3.4 (L) 3.5 - 5.0 g/dL   AST 15 15 - 41 U/L   ALT 13 0 - 44 U/L   Alkaline Phosphatase 65 38 - 126 U/L   Total Bilirubin 0.2 (L) 0.3 - 1.2 mg/dL   GFR calc non Af Amer >60 >60 mL/min   GFR calc Af Amer >60 >60 mL/min   Anion gap 7 5 - 15    Comment: Performed at [facility], [place] [address] [place], [place] [address]  CBC     Status: Abnormal   Collection Time: 02/14/19  6:39 AM  Result Value Ref Range   WBC 12.4 (H) 4.0 - 10.5 K/uL   RBC 3.29 (L) 3.87 - 5.11 MIL/uL   Hemoglobin 9.2 (L) 12.0 - 15.0 g/dL   HCT 29.7 (L) 36.0 - 46.0 %   MCV 90.3 80.0 - 100.0 fL   MCH 28.0 26.0 - 34.0 pg   MCHC 31.0 30.0 - 36.0 g/dL   RDW 13.0 11.5 - 15.5 %   Platelets 505 (H) 150 - 400 K/uL   nRBC 0.0 0.0 - 0.2 %    Comment: Performed at [facility], [place] [address], [place], [place] [address]  Urinalysis, Routine w reflex microscopic     Status: Abnormal   Collection Time: 02/14/19  6:39 AM  Result Value Ref Range   Color, Urine YELLOW YELLOW   APPearance CLOUDY (A) CLEAR   Specific Gravity, Urine 1.020 1.005 - 1.030   pH 5.0 5.0 - 8.0   Glucose, UA NEGATIVE NEGATIVE  mg/dL   Hgb urine dipstick SMALL (A) NEGATIVE   Bilirubin Urine NEGATIVE NEGATIVE   Ketones, ur NEGATIVE NEGATIVE mg/dL   Protein, ur 30 (A) NEGATIVE mg/dL   Nitrite NEGATIVE NEGATIVE   Leukocytes,Ua NEGATIVE NEGATIVE   RBC / HPF 0-5 0 - 5 RBC/hpf   WBC, UA 0-5 0 - 5 WBC/hpf   Bacteria, UA RARE (A) NONE SEEN   Squamous Epithelial / LPF 0-5 0 - 5   Mucus PRESENT     Comment: Performed at [facility], [place] [address] [place], [place] [address]   Ct Abdomen Pelvis W Contrast  Result Date: 02/14/2019 CLINICAL DATA:  Recent colectomy for colon cancer. (02/06/2019). Nausea and vomiting. EXAM: CT ABDOMEN AND PELVIS WITH CONTRAST TECHNIQUE: Multidetector CT imaging of the abdomen and pelvis was performed using the standard protocol following bolus administration of intravenous contrast. CONTRAST:  12mL OMNIPAQUE IOHEXOL 300 MG/ML  SOLN COMPARISON:  Plain film 02/14/2019, CT 09/22/2018 FINDINGS: Lower chest: Mild linear basilar atelectasis. Hepatobiliary: Several low-density lesions in liver favored small benign cysts are too small to characterize. These are not changed from comparison exam. Portal veins are patent. Gallbladder normal. Common bile duct normal Pancreas: No pancreatic inflammation Spleen: Normal spleen Adrenals/urinary tract: Adrenal glands and kidneys are normal. The ureters and bladder normal. Stomach/Bowel: Mild thickening of the distal esophagus. Stomach is normal. There is fluid in the duodenum. The proximal small bowel is fluid-filled and dilated to 3.5 cm. This distension occurs over a large portion of the small bowel with no clear transition point identified. Partial RIGHT hemicolectomy anatomy. There is a 3 cm rounded fluid collection adjacent anastomosis which could is favored redundant bowel associated with the anastomosis. The colon is collapsed. No intraperitoneal free air.  No pneumatosis. Vascular/Lymphatic: Abdominal aorta is normal caliber. No periportal or  retroperitoneal  adenopathy. No pelvic adenopathy. Reproductive: Post hysterectomy. Other: Moderate free fluid the pelvis. Subcutaneous gas along the RIGHT abdominal wall wound appears non complicated. Musculoskeletal: No aggressive osseous lesion. IMPRESSION: 1. Majority of the small bowel is distended with gas and fluid. No clear transition point is identified. Differential includes small bowel obstruction versus severe ileus. Colon decompressed. 2. Partial RIGHT hemicolectomy anatomy without clear complication. 3. Small amount free fluid in the abdomen and pelvis. 4. No pneumatosis or intraperitoneal free air. Electronically Signed   By: Suzy Bouchard M.D.   On: 02/14/2019 10:59   Dg Abdomen Acute W/chest  Result Date: 02/14/2019 CLINICAL DATA:  Abdominal pain and distention. Diarrhea. Colon cancer with partial colectomy 02/06/2019 EXAM: DG ABDOMEN ACUTE W/ 1V CHEST COMPARISON:  CT abdomen pelvis 09/22/2018 FINDINGS: Cardiac enlargement without heart failure. Mild apical scarring bilaterally and mild left lower lobe scarring. Negative for pneumonia. Negative for pleural effusion Dilated small bowel loops in the abdomen, centered in the mid abdomen. These contain air-fluid levels on the upright view. Colon decompressed. No free air. Lumbar levoscoliosis with degenerative change IMPRESSION: Dilated small bowel loops with air-fluid levels consistent with small-bowel obstruction. Colon decompressed No acute cardiopulmonary abnormality. Electronically Signed   By: Franchot Gallo M.D.   On: 02/14/2019 08:39      Assessment/Plan Atrial fibrillation - on eliquis (last dose 2/25 at 1200) HTN  Invasive moderately differentiated adenocarcinoma with 4/24 positive lymph nodes  S/p right colectomy 02/06/2019 by Dr. Harlow Asa SBO vs Ileus - 8 days postop with possible ileus versus SBO. Will admit to med-surg floor for observation. NG tube for decompression and symptomatic relief of nausea/vomiting. IVF,  antiemetics. Repeat abdominal film and labs in the AM. Dr. Harlow Asa notified that patient is being admitted. -Staples removed today, steri strips applied.  ID - none VTE - SCDs, sq heparin FEN - IVF, NPO/NGT to LIWS Foley - none Follow up - Dr. Arthor Captain, [facility] Surgery 02/14/2019, 11:47 AM Pager: [phone] Mon-Thurs 7:00 am-4:30 pm Fri 7:00 am -11:30 AM Sat-Sun 7:00 am-11:30 am

## 2019-02-15 ENCOUNTER — Observation Stay (HOSPITAL_COMMUNITY): Payer: Medicare Other

## 2019-02-15 DIAGNOSIS — Z9841 Cataract extraction status, right eye: Secondary | ICD-10-CM | POA: Diagnosis not present

## 2019-02-15 DIAGNOSIS — Z8249 Family history of ischemic heart disease and other diseases of the circulatory system: Secondary | ICD-10-CM | POA: Diagnosis not present

## 2019-02-15 DIAGNOSIS — Z9842 Cataract extraction status, left eye: Secondary | ICD-10-CM | POA: Diagnosis not present

## 2019-02-15 DIAGNOSIS — I4819 Other persistent atrial fibrillation: Secondary | ICD-10-CM | POA: Diagnosis present

## 2019-02-15 DIAGNOSIS — Z888 Allergy status to other drugs, medicaments and biological substances status: Secondary | ICD-10-CM | POA: Diagnosis not present

## 2019-02-15 DIAGNOSIS — Z882 Allergy status to sulfonamides status: Secondary | ICD-10-CM | POA: Diagnosis not present

## 2019-02-15 DIAGNOSIS — E7800 Pure hypercholesterolemia, unspecified: Secondary | ICD-10-CM | POA: Diagnosis present

## 2019-02-15 DIAGNOSIS — D509 Iron deficiency anemia, unspecified: Secondary | ICD-10-CM | POA: Diagnosis present

## 2019-02-15 DIAGNOSIS — K56609 Unspecified intestinal obstruction, unspecified as to partial versus complete obstruction: Secondary | ICD-10-CM | POA: Diagnosis present

## 2019-02-15 DIAGNOSIS — Z7901 Long term (current) use of anticoagulants: Secondary | ICD-10-CM | POA: Diagnosis not present

## 2019-02-15 DIAGNOSIS — Z9049 Acquired absence of other specified parts of digestive tract: Secondary | ICD-10-CM | POA: Diagnosis not present

## 2019-02-15 DIAGNOSIS — Z886 Allergy status to analgesic agent status: Secondary | ICD-10-CM | POA: Diagnosis not present

## 2019-02-15 DIAGNOSIS — Z90710 Acquired absence of both cervix and uterus: Secondary | ICD-10-CM | POA: Diagnosis not present

## 2019-02-15 DIAGNOSIS — I10 Essential (primary) hypertension: Secondary | ICD-10-CM | POA: Diagnosis present

## 2019-02-15 DIAGNOSIS — H9190 Unspecified hearing loss, unspecified ear: Secondary | ICD-10-CM | POA: Diagnosis present

## 2019-02-15 DIAGNOSIS — Z79899 Other long term (current) drug therapy: Secondary | ICD-10-CM | POA: Diagnosis not present

## 2019-02-15 DIAGNOSIS — Z87442 Personal history of urinary calculi: Secondary | ICD-10-CM | POA: Diagnosis not present

## 2019-02-15 DIAGNOSIS — K219 Gastro-esophageal reflux disease without esophagitis: Secondary | ICD-10-CM | POA: Diagnosis present

## 2019-02-15 DIAGNOSIS — Z85038 Personal history of other malignant neoplasm of large intestine: Secondary | ICD-10-CM | POA: Diagnosis not present

## 2019-02-15 DIAGNOSIS — Z961 Presence of intraocular lens: Secondary | ICD-10-CM | POA: Diagnosis present

## 2019-02-15 LAB — BASIC METABOLIC PANEL
Anion gap: 7 (ref 5–15)
BUN: 11 mg/dL (ref 8–23)
CO2: 24 mmol/L (ref 22–32)
Calcium: 8 mg/dL — ABNORMAL LOW (ref 8.9–10.3)
Chloride: 105 mmol/L (ref 98–111)
Creatinine, Ser: 0.66 mg/dL (ref 0.44–1.00)
GFR calc Af Amer: >60 mL/min (ref >60)
GFR calc non Af Amer: >60 mL/min (ref >60)
Glucose, Bld: 76 mg/dL (ref 70–99)
Potassium: 4.4 mmol/L (ref 3.5–5.1)
Sodium: 136 mmol/L (ref 135–145)

## 2019-02-15 LAB — CBC
HCT: 28.5 % — ABNORMAL LOW (ref 36.0–46.0)
Hemoglobin: 8.4 g/dL — ABNORMAL LOW (ref 12.0–15.0)
MCH: 27 pg (ref 26.0–34.0)
MCHC: 29.5 g/dL — ABNORMAL LOW (ref 30.0–36.0)
MCV: 91.6 fL (ref 80.0–100.0)
Platelets: 509 10*3/uL — ABNORMAL HIGH (ref 150–400)
RBC: 3.11 MIL/uL — ABNORMAL LOW (ref 3.87–5.11)
RDW: 13.1 % (ref 11.5–15.5)
WBC: 9.7 10*3/uL (ref 4.0–10.5)
nRBC: 0 % (ref 0.0–0.2)

## 2019-02-15 MED ORDER — KCL IN DEXTROSE-NACL 20-5-0.45 MEQ/L-%-% IV SOLN
INTRAVENOUS | Status: DC
  Administered 2019-02-15 – 2019-02-16 (×2): via INTRAVENOUS
  Filled 2019-02-15 (×2): qty 1000

## 2019-02-15 NOTE — Progress Notes (Signed)
Assessment & Plan: HD#2 / POD#9 - status post right colectomy for cecal adenocarcinoma  NG in place, IVF, NPO  Ambulatory  AXR with mild improvement, will repeat in AM 2/28        Armandina Gemma, MD       [facility]       Office: [phone]   Chief Complaint: Nausea, emesis - rule out SBO  Subjective: Patient in bed, family at bedside.  No complaints.  Hungry.  Passing flatus, small BM this AM.  Objective: Vital signs in last 24 hours: Temp:  [97.8 F (36.6 C)-98.7 F (37.1 C)] 97.8 F (36.6 C) (02/27 0940) Pulse Rate:  [82-102] 91 (02/27 0940) Resp:  [16-20] 18 (02/27 0940) BP: (136-159)/(69-84) 155/84 (02/27 0940) SpO2:  [98 %-100 %] 100 % (02/27 0940) Last BM Date: 02/14/19  Intake/Output from previous day: 02/26 0701 - 02/27 0700 In: 1585.9 [I.V.:1585.9] Out: 1950 [Urine:1800; Emesis/NG output:150] Intake/Output this shift: Total I/O In: 268.3 [I.V.:268.3] Out: 800 [Urine:700; Emesis/NG output:100]  Physical Exam: HEENT - sclerae clear, mucous membranes moist Neck - soft Chest - clear bilaterally Cor - RRR Abdomen - soft, mild distension; quiet; wound dry and intact Ext - no edema, non-tender Neuro - alert & oriented, no focal deficits  Lab Results:  Recent Labs    02/14/19 0639 02/15/19 0432  WBC 12.4* 9.7  HGB 9.2* 8.4*  HCT 29.7* 28.5*  PLT 505* 509*   BMET Recent Labs    02/14/19 0639 02/15/19 0432  NA 136 136  K 3.7 4.4  CL 101 105  CO2 28 24  GLUCOSE 137* 76  BUN 18 11  CREATININE 0.85 0.66  CALCIUM 8.8* 8.0*   PT/INR No results for input(s): LABPROT, INR in the last 72 hours. Comprehensive Metabolic Panel:    Component Value Date/Time   NA 136 02/15/2019 0432   NA 136 02/14/2019 0639   NA 139 03/20/2018 0902   NA 140 09/12/2017 0917   K 4.4 02/15/2019 0432   K 3.7 02/14/2019 0639   CL 105 02/15/2019 0432   CL 101 02/14/2019 0639   CO2 24 02/15/2019 0432   CO2 28 02/14/2019 0639   BUN 11  02/15/2019 0432   BUN 18 02/14/2019 0639   BUN 11 03/20/2018 0902   BUN 12 09/12/2017 0917   CREATININE 0.66 02/15/2019 0432   CREATININE 0.85 02/14/2019 0639   CREATININE 0.78 09/14/2016 0949   CREATININE 0.67 03/22/2016 1008   GLUCOSE 76 02/15/2019 0432   GLUCOSE 137 (H) 02/14/2019 0639   CALCIUM 8.0 (L) 02/15/2019 0432   CALCIUM 8.8 (L) 02/14/2019 0639   AST 15 02/14/2019 0639   AST 22 09/22/2018 1832   ALT 13 02/14/2019 0639   ALT 17 09/22/2018 1832   ALKPHOS 65 02/14/2019 0639   ALKPHOS 93 09/22/2018 1832   BILITOT 0.2 (L) 02/14/2019 0639   BILITOT 0.4 09/22/2018 1832   PROT 6.6 02/14/2019 0639   PROT 7.5 09/22/2018 1832   ALBUMIN 3.4 (L) 02/14/2019 0639   ALBUMIN 4.4 09/22/2018 1832    Studies/Results: Dg Abdomen 1 View  Result Date: 02/14/2019 CLINICAL DATA:  Status post enteric tube placement. EXAM: ABDOMEN - 1 VIEW COMPARISON:  02/14/2019 FINDINGS: The ET tube tip is below the level of the GE junction within the expected location of the body of stomach. Side port is just below the GE junction. Dilated loops of small bowel are identified, not significantly changed from previous exam. IMPRESSION: 1.  The enteric tube tip is in the expected location of the body of stomach. 2. No change in small bowel dilatation. Electronically Signed   By: Kerby Moors M.D.   On: 02/14/2019 12:40   Ct Abdomen Pelvis W Contrast  Result Date: 02/14/2019 CLINICAL DATA:  Recent colectomy for colon cancer. (02/06/2019). Nausea and vomiting. EXAM: CT ABDOMEN AND PELVIS WITH CONTRAST TECHNIQUE: Multidetector CT imaging of the abdomen and pelvis was performed using the standard protocol following bolus administration of intravenous contrast. CONTRAST:  13mL OMNIPAQUE IOHEXOL 300 MG/ML  SOLN COMPARISON:  Plain film 02/14/2019, CT 09/22/2018 FINDINGS: Lower chest: Mild linear basilar atelectasis. Hepatobiliary: Several low-density lesions in liver favored small benign cysts are too small to  characterize. These are not changed from comparison exam. Portal veins are patent. Gallbladder normal. Common bile duct normal Pancreas: No pancreatic inflammation Spleen: Normal spleen Adrenals/urinary tract: Adrenal glands and kidneys are normal. The ureters and bladder normal. Stomach/Bowel: Mild thickening of the distal esophagus. Stomach is normal. There is fluid in the duodenum. The proximal small bowel is fluid-filled and dilated to 3.5 cm. This distension occurs over a large portion of the small bowel with no clear transition point identified. Partial RIGHT hemicolectomy anatomy. There is a 3 cm rounded fluid collection adjacent anastomosis which could is favored redundant bowel associated with the anastomosis. The colon is collapsed. No intraperitoneal free air.  No pneumatosis. Vascular/Lymphatic: Abdominal aorta is normal caliber. No periportal or retroperitoneal adenopathy. No pelvic adenopathy. Reproductive: Post hysterectomy. Other: Moderate free fluid the pelvis. Subcutaneous gas along the RIGHT abdominal wall wound appears non complicated. Musculoskeletal: No aggressive osseous lesion. IMPRESSION: 1. Majority of the small bowel is distended with gas and fluid. No clear transition point is identified. Differential includes small bowel obstruction versus severe ileus. Colon decompressed. 2. Partial RIGHT hemicolectomy anatomy without clear complication. 3. Small amount free fluid in the abdomen and pelvis. 4. No pneumatosis or intraperitoneal free air. Electronically Signed   By: Suzy Bouchard M.D.   On: 02/14/2019 10:59   Dg Abdomen Acute W/chest  Result Date: 02/14/2019 CLINICAL DATA:  Abdominal pain and distention. Diarrhea. Colon cancer with partial colectomy 02/06/2019 EXAM: DG ABDOMEN ACUTE W/ 1V CHEST COMPARISON:  CT abdomen pelvis 09/22/2018 FINDINGS: Cardiac enlargement without heart failure. Mild apical scarring bilaterally and mild left lower lobe scarring. Negative for pneumonia.  Negative for pleural effusion Dilated small bowel loops in the abdomen, centered in the mid abdomen. These contain air-fluid levels on the upright view. Colon decompressed. No free air. Lumbar levoscoliosis with degenerative change IMPRESSION: Dilated small bowel loops with air-fluid levels consistent with small-bowel obstruction. Colon decompressed No acute cardiopulmonary abnormality. Electronically Signed   By: Franchot Gallo M.D.   On: 02/14/2019 08:39   Dg Abd Portable 1v  Result Date: 02/15/2019 CLINICAL DATA:  Follow-up small bowel obstruction EXAM: PORTABLE ABDOMEN - 1 VIEW COMPARISON:  02/14/2019 FINDINGS: Nasogastric tube tip in the stomach. Persistent dilated fluid and air-filled loops of small intestine, perhaps minimally improved compared to yesterday. Some gas does enter the colon. No new finding. IMPRESSION: Persistent partial small bowel obstruction pattern. Small-bowel is probably slightly less dilated than yesterday Electronically Signed   By: Nelson Chimes M.D.   On: 02/15/2019 08:08      Armandina Gemma 02/15/2019  Patient ID: Kristen Cruz, female   DOB: 10-27-1936, 83 y.o.   MRN: [id number]

## 2019-02-16 ENCOUNTER — Inpatient Hospital Stay (HOSPITAL_COMMUNITY): Payer: Medicare Other

## 2019-02-16 NOTE — Progress Notes (Signed)
Assessment & Plan: HD#3 / POD#10 - status post right colectomy for cecal adenocarcinoma             AXR much improved this AM with resolution SBO  Discontinue NG tube  Begin clear liquid diet and advance as tolerated  Encouraged ambulation  Hopefully home tomorrow if making good progress        Armandina Gemma, MD       [facility]       Office: [phone]   Chief Complaint: SBO after right colectomy  Subjective: Patient in bed, denies nausea or emesis.  Denies pain.  Passing flatus.  No more BM's.  Objective: Vital signs in last 24 hours: Temp:  [98 F (36.7 C)-98.9 F (37.2 C)] 98.2 F (36.8 C) (02/28 0535) Pulse Rate:  [88-98] 88 (02/28 0535) Resp:  [16-18] 16 (02/28 0535) BP: (131-154)/(79-86) 131/82 (02/28 0535) SpO2:  [99 %-100 %] 99 % (02/28 0535) Last BM Date: 02/15/19(pt said she had small BM )  Intake/Output from previous day: 02/27 0701 - 02/28 0700 In: 1410.5 [I.V.:1410.5] Out: 3375 [Urine:2725; Emesis/NG output:650] Intake/Output this shift: Total I/O In: -  Out: 100 [Emesis/NG output:100]  Physical Exam: HEENT - sclerae clear, mucous membranes moist Neck - soft Chest - clear bilaterally Cor - RRR Abdomen - soft without distension; BS present; non-tender Ext - no edema, non-tender Neuro - alert & oriented, no focal deficits  Lab Results:  Recent Labs    02/14/19 0639 02/15/19 0432  WBC 12.4* 9.7  HGB 9.2* 8.4*  HCT 29.7* 28.5*  PLT 505* 509*   BMET Recent Labs    02/14/19 0639 02/15/19 0432  NA 136 136  K 3.7 4.4  CL 101 105  CO2 28 24  GLUCOSE 137* 76  BUN 18 11  CREATININE 0.85 0.66  CALCIUM 8.8* 8.0*   PT/INR No results for input(s): LABPROT, INR in the last 72 hours. Comprehensive Metabolic Panel:    Component Value Date/Time   NA 136 02/15/2019 0432   NA 136 02/14/2019 0639   NA 139 03/20/2018 0902   NA 140 09/12/2017 0917   K 4.4 02/15/2019 0432   K 3.7 02/14/2019 0639   CL 105 02/15/2019  0432   CL 101 02/14/2019 0639   CO2 24 02/15/2019 0432   CO2 28 02/14/2019 0639   BUN 11 02/15/2019 0432   BUN 18 02/14/2019 0639   BUN 11 03/20/2018 0902   BUN 12 09/12/2017 0917   CREATININE 0.66 02/15/2019 0432   CREATININE 0.85 02/14/2019 0639   CREATININE 0.78 09/14/2016 0949   CREATININE 0.67 03/22/2016 1008   GLUCOSE 76 02/15/2019 0432   GLUCOSE 137 (H) 02/14/2019 0639   CALCIUM 8.0 (L) 02/15/2019 0432   CALCIUM 8.8 (L) 02/14/2019 0639   AST 15 02/14/2019 0639   AST 22 09/22/2018 1832   ALT 13 02/14/2019 0639   ALT 17 09/22/2018 1832   ALKPHOS 65 02/14/2019 0639   ALKPHOS 93 09/22/2018 1832   BILITOT 0.2 (L) 02/14/2019 0639   BILITOT 0.4 09/22/2018 1832   PROT 6.6 02/14/2019 0639   PROT 7.5 09/22/2018 1832   ALBUMIN 3.4 (L) 02/14/2019 0639   ALBUMIN 4.4 09/22/2018 1832    Studies/Results: Dg Abdomen 1 View  Result Date: 02/14/2019 CLINICAL DATA:  Status post enteric tube placement. EXAM: ABDOMEN - 1 VIEW COMPARISON:  02/14/2019 FINDINGS: The ET tube tip is below the level of the GE junction within the expected location of the  body of stomach. Side port is just below the GE junction. Dilated loops of small bowel are identified, not significantly changed from previous exam. IMPRESSION: 1. The enteric tube tip is in the expected location of the body of stomach. 2. No change in small bowel dilatation. Electronically Signed   By: Kerby Moors M.D.   On: 02/14/2019 12:40   Dg Abd Portable 1v  Result Date: 02/15/2019 CLINICAL DATA:  Follow-up small bowel obstruction EXAM: PORTABLE ABDOMEN - 1 VIEW COMPARISON:  02/14/2019 FINDINGS: Nasogastric tube tip in the stomach. Persistent dilated fluid and air-filled loops of small intestine, perhaps minimally improved compared to yesterday. Some gas does enter the colon. No new finding. IMPRESSION: Persistent partial small bowel obstruction pattern. Small-bowel is probably slightly less dilated than yesterday Electronically Signed   By:  Nelson Chimes M.D.   On: 02/15/2019 08:08      Armandina Gemma 02/16/2019  Patient ID: [name], female   DOB: [birth year], 83 y.o.   MRN: [id number]

## 2019-02-16 NOTE — Progress Notes (Signed)
Patient reports two soft BMs, small. Donne Hazel, RN

## 2019-02-17 MED ORDER — PHENOL 1.4 % MT LIQD
1.0000 | OROMUCOSAL | Status: DC | PRN
  Filled 2019-02-17: qty 177

## 2019-02-17 NOTE — Progress Notes (Signed)
Assumed care of pt from previous nurse; agree with previous nurses assessment; will continue to monitor patient.

## 2019-02-17 NOTE — Discharge Instructions (Signed)

## 2019-02-17 NOTE — Progress Notes (Signed)
PIV removed, discharge instructions printed and reviewed with pt and spouse. They deny any questions or concerns. Pt discharged to home with husband. Pt tolerated well.

## 2019-02-17 NOTE — Discharge Summary (Signed)
Physician Discharge Summary  Patient ID: JOEANN STEPPE MRN: [id number] DOB/AGE: February 05, 1936 83 y.o.  Admit date: 02/14/2019 Discharge date: 02/17/2019  Admission Diagnoses: SBO  Discharge Diagnoses:  Active Problems:   SBO (small bowel obstruction) ([place])   Discharged Condition: good  Hospital Course: Pt admitted with early post op SBO.  This resolved with NG placement.  Once this happened, her diet was advanced as tolerated.  She was discharge once tolerating solid foods.   Consults: None  Significant Diagnostic Studies: labs: cbc, bmet  Treatments: IV hydration  Discharge Exam: Blood pressure 112/72, pulse 81, temperature 98.2 F (36.8 C), temperature source Oral, resp. rate 20, height 5' 2.5" (1.588 m), weight 41.7 kg, SpO2 98 %. General appearance: alert and cooperative GI: normal findings: soft, non-tender Incision/Wound: clean, dry, intact  Disposition: Discharge disposition: 01-Home or Self Care        Allergies as of 02/17/2019      Reactions   Asa [aspirin] Rash   Bentyl [dicyclomine Hcl] Rash   Sucrets [dyclonine] Rash   Sulfa Antibiotics Rash   Tetracycline Hcl Rash   Tylenol [acetaminophen] Rash      Medication List    TAKE these medications   acetaminophen 325 MG tablet Commonly known as:  TYLENOL Take 162.5 mg by mouth every 6 (six) hours as needed for mild pain or headache.   apixaban 2.5 MG Tabs tablet Commonly known as:  ELIQUIS Take 1 tablet (2.5 mg total) by mouth 2 (two) times daily.   DILT-XR 180 MG 24 hr capsule Generic drug:  diltiazem TAKE 1 CAPSULE BY MOUTH EVERY DAY What changed:  how much to take   ferrous sulfate 325 (65 FE) MG EC tablet Take 1 tablet (325 mg total) by mouth 2 (two) times daily. What changed:  when to take this   GAVILYTE-N WITH FLAVOR PACK 420 g solution Generic drug:  polyethylene glycol-electrolytes Take 420 g by mouth once.   ondansetron 4 MG disintegrating tablet Commonly known as:   ZOFRAN-ODT Take 4 mg by mouth every 6 (six) hours as needed for nausea or vomiting.   REFRESH OP Apply 2 drops to eye 2 (two) times daily as needed (dry eyes).   vitamin B-12 1000 MCG tablet Commonly known as:  CYANOCOBALAMIN Take 1,000 mcg by mouth daily.   vitamin C 250 MG tablet Commonly known as:  ASCORBIC ACID Take 1 tablet (250 mg total) by mouth daily.      Follow-up Information    Armandina Gemma, MD Follow up.   Specialty:  General Surgery Why:  as scheduled Contact information: [address] [place] [address] [phone]           Signed: Rosario Adie 5/85/9292, 10:21 AM

## 2019-02-19 NOTE — Progress Notes (Signed)
Cardiology Office Note:    Date:  02/22/2019   ID:  Kristen Cruz, DOB 1936-10-07, MRN [id number]  PCP:  Eulas Post, MD  Cardiologist:  Fransico Him, MD  Referring MD: Eulas Post, MD   Chief Complaint  Patient presents with  . Hospitalization Follow-up    Bradycardia    History of Present Illness:    Kristen Cruz is a 83 y.o. female with a past medical history significant for paroxysmal atrial fibrillation on Eliquis, hypertension, and GIB 09/2018. She was seen in the hospital on 02/07/19 for evaluation of an episode of bradycardia after undergoing an open right colectomy for a cecal mass. She has reportedly had a brief episode of afib with HR of 111 bpm, although no rhythm strips to review. Review of strips did show sinus pause of about 4.5 seconds. Pt was reportedly sleeping at the time. There was a question of apnea at the time. Her husband reports that she does stop breathing when she is sleeping. She had no further bradycardic episodes during remainder of hospitalization. Her diltiazem for rate control was resumed as well as anticoagulation.   Kristen Cruz is here today for hospital follow up.  She was readmitted to the hospital last week for 3 days for post op SBO. She says she did not get her heart meds during that time as she had a NG tube. She has had no palpitations, lightheadedness or syncope. She has had no chest pain or shortness of breath. She is back to eating normally.   Pt says that her husband says she snores. She however does not think it is signficiant and does not want to have a Sleep study.   Past Medical History:  Diagnosis Date  . Anticoagulant long-term use    eliquis  . Arthritis   . Cecum mass   . History of GI bleed 09/22/2018   upper  . HOH (hard of hearing)   . Hypertension   . IDA (iron deficiency anemia)   . Persistent atrial fibrillation cardiologist-- dr Radford Pax   CHADS2VASC score is 4 on Apixaban  . PONV (postoperative  nausea and vomiting)   . Rash    "allergic to my on body tempurature , if I have fever I break out in a rash"  . Right nephrolithiasis    per CT 09-22-2018  . Wears dentures    upper  . Wears glasses     Past Surgical History:  Procedure Laterality Date  . ABDOMINAL HYSTERECTOMY  1978   ovaries remain  . BREAST CYST EXCISION Right yrs ago   benign  . CATARACT EXTRACTION W/ INTRAOCULAR LENS  IMPLANT, BILATERAL  2014  approx.  . COLONOSCOPY  01-22-2019   dr Watt Climes  . PARTIAL COLECTOMY Right 02/06/2019   Procedure: OPEN RIGHT COLECTOMY;  Surgeon: Armandina Gemma, MD;  Location: [facility];  Service: General;  Laterality: Right;    Current Medications: Current Meds  Medication Sig  . apixaban (ELIQUIS) 2.5 MG TABS tablet Take 1 tablet (2.5 mg total) by mouth 2 (two) times daily.  Marland Kitchen DILT-XR 180 MG 24 hr capsule TAKE 1 CAPSULE BY MOUTH EVERY DAY (Patient taking differently: Take 180 mg by mouth daily. )  . ferrous sulfate 325 (65 FE) MG EC tablet Take 1 tablet (325 mg total) by mouth 2 (two) times daily. (Patient taking differently: Take 325 mg by mouth daily. )  . ondansetron (ZOFRAN-ODT) 4 MG disintegrating tablet Take 4 mg by mouth every 6 (six)  hours as needed for nausea or vomiting.  . vitamin B-12 (CYANOCOBALAMIN) 1000 MCG tablet Take 1,000 mcg by mouth daily.  . vitamin C (ASCORBIC ACID) 250 MG tablet Take 1 tablet (250 mg total) by mouth daily.     Allergies:   Asa [aspirin]; Bentyl [dicyclomine hcl]; Sucrets [dyclonine]; Sulfa antibiotics; Tetracycline hcl; and Tylenol [acetaminophen]   Social History   Socioeconomic History  . Marital status: Married    Spouse name: Not on file  . Number of children: Not on file  . Years of education: Not on file  . Highest education level: Not on file  Occupational History  . Not on file  Social Needs  . Financial resource strain: Not on file  . Food insecurity:    Worry: Not on file    Inability: Not on file  . Transportation needs:     Medical: Not on file    Non-medical: Not on file  Tobacco Use  . Smoking status: Never Smoker  . Smokeless tobacco: Never Used  Substance and Sexual Activity  . Alcohol use: No  . Drug use: Never  . Sexual activity: Not on file  Lifestyle  . Physical activity:    Days per week: Not on file    Minutes per session: Not on file  . Stress: Not on file  Relationships  . Social connections:    Talks on phone: Not on file    Gets together: Not on file    Attends religious service: Not on file    Active member of club or organization: Not on file    Attends meetings of clubs or organizations: Not on file    Relationship status: Not on file  Other Topics Concern  . Not on file  Social History Narrative  . Not on file     Family History: The patient's family history includes Cancer in her brother and sister; Heart Problems in her mother; Heart attack in her mother; Hypertension in her mother. ROS:   Please see the history of present illness.     All other systems reviewed and are negative.  EKGs/Labs/Other Studies Reviewed:    The following studies were reviewed today:  Echocardiogram 03/08/2014 Study Conclusions Left ventricle: The cavity size was normal. Systolic function was normal. The estimated ejection fraction was in the range of 55% to 65%. Wall motion was normal; there were no regional wall motion abnormalities.  EKG:  EKG is ordered today.  The ekg ordered today demonstrates NSR, 84 bpm, LAD, Moderated voltage criteria for LVH, may be normal variant, nonspec ST abn. No significant change from prior.   Recent Labs: 02/14/2019: ALT 13 02/15/2019: BUN 11; Creatinine, Ser 0.66; Hemoglobin 8.4; Platelets 509; Potassium 4.4; Sodium 136   Recent Lipid Panel No results found for: CHOL, TRIG, HDL, CHOLHDL, VLDL, LDLCALC, LDLDIRECT  Physical Exam:    VS:  BP 126/62   Pulse 84   Ht 5' 2.5" (1.588 m)   Wt 95 lb 12.8 oz (43.5 kg)   SpO2 98%   BMI 17.24 kg/m     Wt  Readings from Last 3 Encounters:  02/22/19 95 lb 12.8 oz (43.5 kg)  02/14/19 92 lb (41.7 kg)  02/09/19 92 lb 4.8 oz (41.9 kg)     Physical Exam  Constitutional: She is oriented to person, place, and time. She appears well-developed and well-nourished. No distress.  HENT:  Head: Normocephalic and atraumatic.  Neck: Normal range of motion. Neck supple. No JVD present.  Cardiovascular:  Normal rate, regular rhythm, normal heart sounds and intact distal pulses. Exam reveals no gallop and no friction rub.  No murmur heard. Pulmonary/Chest: Effort normal and breath sounds normal. No respiratory distress. She has no wheezes. She has no rales.  Abdominal: Soft. Bowel sounds are normal.  Musculoskeletal: Normal range of motion.        General: No deformity or edema.  Neurological: She is alert and oriented to person, place, and time.  Skin: Skin is warm and dry.  Psychiatric: She has a normal mood and affect. Her behavior is normal. Judgment and thought content normal.  Vitals reviewed.   ASSESSMENT:    1. Bradycardia following surgery   2. Paroxysmal atrial fibrillation (HCC)   3. Essential hypertension, benign   4. Symptomatic anemia    PLAN:    In order of problems listed above:  Bradyacardia -Noted post op after partial colectomy surgery. No apparent recurrences. No dizziness or syncope. Pt is feeling very well.  -Could have been a vagal reaction after abdominal surgery. Sleep apnea was also considered at the time as patient's husband reported that she snores and may stop breathing. Discussed with pt. She does not think that her snoring is significant and really does not want a sleep study.  -No changes at this time.   Paroxysmal atrial fibrillation -Rate controlled on diltiazem. In sinus rhythm today and pt denies any recent palpitations or lightheadedness.  -On Eliquis for stroke risk reduction. Reduced dose for age and Wt<60 kg.  -Continue current medical therapy.    Hypertension -On diltiazem. BP well controlled.   Anemia -Pt had partial colectomy for cecal mass. It was felt that this may help anemia. She is on iron supplements. To be followed by surgery/PCP/oncology. Currently asymptomatic.     Medication Adjustments/Labs and Tests Ordered: Current medicines are reviewed at length with the patient today.  Concerns regarding medicines are outlined above. Labs and tests ordered and medication changes are outlined in the patient instructions below:  Patient Instructions  Medication Instructions:  .isntcur  If you need a refill on your cardiac medications before your next appointment, please call your pharmacy.   Lab work: None  If you have labs (blood work) drawn today and your tests are completely normal, you will receive your results only by: Marland Kitchen MyChart Message (if you have MyChart) OR . A paper copy in the mail If you have any lab test that is abnormal or we need to change your treatment, we will call you to review the results.  Testing/Procedures: None  Follow-Up: At [facility], you and your health needs are our priority.  As part of our continuing mission to provide you with exceptional heart care, we have created designated Provider Care Teams.  These Care Teams include your primary Cardiologist (physician) and Advanced Practice Providers (APPs -  Physician Assistants and Nurse Practitioners) who all work together to provide you with the care you need, when you need it. You will need a follow up appointment in 6 months.  Please call our office 2 months in advance to schedule this appointment.  You may see Fransico Him, MD or one of the following Advanced Practice Providers on your designated Care Team:   [place], PA-C Melina Copa, PA-C . Ermalinda Barrios, PA-C  Any Other Special Instructions Will Be Listed Below (If Applicable).       Signed, Daune Perch, NP  02/22/2019 9:36 AM    [place]

## 2019-02-20 DIAGNOSIS — I9789 Other postprocedural complications and disorders of the circulatory system, not elsewhere classified: Secondary | ICD-10-CM | POA: Insufficient documentation

## 2019-02-22 ENCOUNTER — Encounter: Payer: Self-pay | Admitting: Cardiology

## 2019-02-22 ENCOUNTER — Ambulatory Visit (INDEPENDENT_AMBULATORY_CARE_PROVIDER_SITE_OTHER): Payer: Medicare Other | Admitting: Cardiology

## 2019-02-22 VITALS — BP 126/62 | HR 84 | Ht 62.5 in | Wt 95.8 lb

## 2019-02-22 DIAGNOSIS — I10 Essential (primary) hypertension: Secondary | ICD-10-CM

## 2019-02-22 DIAGNOSIS — D649 Anemia, unspecified: Secondary | ICD-10-CM

## 2019-02-22 DIAGNOSIS — I9789 Other postprocedural complications and disorders of the circulatory system, not elsewhere classified: Secondary | ICD-10-CM

## 2019-02-22 DIAGNOSIS — I480 Paroxysmal atrial fibrillation: Secondary | ICD-10-CM

## 2019-02-22 NOTE — Patient Instructions (Signed)
Medication Instructions:  .isntcur  If you need a refill on your cardiac medications before your next appointment, please call your pharmacy.   Lab work: None  If you have labs (blood work) drawn today and your tests are completely normal, you will receive your results only by: Marland Kitchen MyChart Message (if you have MyChart) OR . A paper copy in the mail If you have any lab test that is abnormal or we need to change your treatment, we will call you to review the results.  Testing/Procedures: None  Follow-Up: At [facility], you and your health needs are our priority.  As part of our continuing mission to provide you with exceptional heart care, we have created designated Provider Care Teams.  These Care Teams include your primary Cardiologist (physician) and Advanced Practice Providers (APPs -  Physician Assistants and Nurse Practitioners) who all work together to provide you with the care you need, when you need it. You will need a follow up appointment in 6 months.  Please call our office 2 months in advance to schedule this appointment.  You may see Fransico Him, MD or one of the following Advanced Practice Providers on your designated Care Team:   Liberal, PA-C Melina Copa, PA-C . Ermalinda Barrios, PA-C  Any Other Special Instructions Will Be Listed Below (If Applicable).

## 2019-02-26 ENCOUNTER — Telehealth: Payer: Self-pay | Admitting: Oncology

## 2019-02-26 ENCOUNTER — Other Ambulatory Visit: Payer: Self-pay | Admitting: Oncology

## 2019-02-26 ENCOUNTER — Inpatient Hospital Stay: Payer: Medicare Other | Attending: Oncology | Admitting: Oncology

## 2019-02-26 ENCOUNTER — Other Ambulatory Visit: Payer: Self-pay

## 2019-02-26 VITALS — BP 149/78 | HR 96 | Temp 98.9°F | Resp 18 | Ht 62.5 in | Wt 95.8 lb

## 2019-02-26 DIAGNOSIS — C772 Secondary and unspecified malignant neoplasm of intra-abdominal lymph nodes: Secondary | ICD-10-CM

## 2019-02-26 DIAGNOSIS — C189 Malignant neoplasm of colon, unspecified: Secondary | ICD-10-CM | POA: Insufficient documentation

## 2019-02-26 DIAGNOSIS — Z8371 Family history of colonic polyps: Secondary | ICD-10-CM

## 2019-02-26 DIAGNOSIS — C180 Malignant neoplasm of cecum: Secondary | ICD-10-CM | POA: Insufficient documentation

## 2019-02-26 DIAGNOSIS — K648 Other hemorrhoids: Secondary | ICD-10-CM

## 2019-02-26 DIAGNOSIS — N200 Calculus of kidney: Secondary | ICD-10-CM

## 2019-02-26 DIAGNOSIS — C182 Malignant neoplasm of ascending colon: Secondary | ICD-10-CM

## 2019-02-26 DIAGNOSIS — Z8719 Personal history of other diseases of the digestive system: Secondary | ICD-10-CM

## 2019-02-26 DIAGNOSIS — Z800 Family history of malignant neoplasm of digestive organs: Secondary | ICD-10-CM

## 2019-02-26 DIAGNOSIS — K644 Residual hemorrhoidal skin tags: Secondary | ICD-10-CM | POA: Diagnosis not present

## 2019-02-26 DIAGNOSIS — Z9049 Acquired absence of other specified parts of digestive tract: Secondary | ICD-10-CM

## 2019-02-26 DIAGNOSIS — D630 Anemia in neoplastic disease: Secondary | ICD-10-CM

## 2019-02-26 DIAGNOSIS — Z90710 Acquired absence of both cervix and uterus: Secondary | ICD-10-CM

## 2019-02-26 NOTE — Progress Notes (Signed)
[facility] New Patient Consult   Requesting MD: Tyrone Balash 83 y.o.  [birth year]    Reason for Consult: Colon cancer   HPI: Ms. Rayson is maintained on apixaban in the setting of atrial fibrillation.  She saw Dr. Radford Pax for follow-up in October 2019.  A CBC found the hemoglobin at 7.6 with an MCV of 71.  The ferritin returned at 3.  She was admitted and transfused with 1 unit of packed red blood cells.  The stool returned Hemoccult positive.  A CT of the abdomen and pelvis revealed a suspected cyst in the right hepatic dome.  No acute abdominopelvic process.  Nonobstructing right renal nonobstructing stone.  She was referred to Dr. Watt Climes and underwent a colonoscopy on 01/22/2019.  External and internal hemorrhoids were noted.  A diminutive polyp was found in the mid transverse colon.  A biopsy was obtained.  A small polyp was removed from the distal transverse colon.  The mass was found at the cecum.  No bleeding was present.  A biopsy was obtained.  The pathology revealed invasive well-differentiated adenocarcinoma at the cecum biopsy.  Biopsies from the transverse colon returned as tubular adenomas.  Mismatch repair protein expression was intact on the cecum biopsy.  She was referred to Dr. Harlow Asa and was taken to the operating room for a right colectomy on 02/06/2019.  No evidence of metastatic disease to the liver.  This was noted at the cecum.  The distal ileum and proximal transverse colon transected.  A side-to-side anastomosis was created between the terminal ileum and proximal transverse colon.   The pathology ([id number]) revealed an invasive moderately differentiated adenocarcinoma of the cecum.  Tumor invaded pericolonic soft tissue.  4 of 24 lymph nodes contained metastatic carcinoma and there was one tumor deposit.  The resection margins are negative.  No macroscopic tumor perforation.  No lymphovascular or perineural invasion.  The tumor returned  MSI stable with no loss of mismatch repair protein expression.  She was readmitted 02/17/2019 with a postoperative small bowel obstruction.  This resolved with NG tube placement.  She was discharged home on 02/17/2019.  Her bowels are functioning.  She reports feeling completely well prior to the colonoscopy procedure in February. Past Medical History:  Diagnosis Date  . Anticoagulant long-term use    eliquis  . Arthritis   .  Adenocarcinoma of the cecum (T3N2 ( 02/06/2019  . History of GI bleed 09/22/2018   upper  . HOH (hard of hearing)   . Hypertension   . IDA (iron deficiency anemia)  October 2019  . Persistent atrial fibrillation cardiologist-- dr Radford Pax   CHADS2VASC score is 4 on Apixaban  . PONV (postoperative nausea and vomiting)   . Rash    "allergic to my on body tempurature , if I have fever I break out in a rash"  . Right nephrolithiasis    per CT 09-22-2018  . Wears dentures    upper  . Wears glasses     . G3, P3  Past Surgical History:  Procedure Laterality Date  . ABDOMINAL HYSTERECTOMY  1978   ovaries remain  . BREAST CYST EXCISION Right yrs ago   benign  . CATARACT EXTRACTION W/ INTRAOCULAR LENS  IMPLANT, BILATERAL  2014  approx.  . COLONOSCOPY  01-22-2019   dr Watt Climes  . PARTIAL COLECTOMY Right 02/06/2019   Procedure: OPEN RIGHT COLECTOMY;  Surgeon: Armandina Gemma, MD;  Location: [facility];  Service: General;  Laterality:  Right;    Medications: Reviewed  Allergies:  Allergies  Allergen Reactions  . Asa [Aspirin] Rash  . Bentyl [Dicyclomine Hcl] Rash  . Sucrets [Dyclonine] Rash  . Sulfa Antibiotics Rash  . Tetracycline Hcl Rash  . Tylenol [Acetaminophen] Rash    Family history: Her brother had colon cancer.  Her sister had esophagus cancer.  Her daughter had colon polyps.  No other family history of cancer.  Social History:   She lives with her husband in [place].  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  She does not use cigarettes or  alcohol.  She received a red cell transfusion in October 2019.  No risk factor for HIV or hepatitis.  ROS:   Positives include: Postoperative nausea and vomiting-resolved  A complete ROS was otherwise negative.  Physical Exam:  Blood pressure (!) 149/78, pulse 96, temperature 98.9 F (37.2 C), temperature source Oral, resp. rate 18, height 5' 2.5" (1.588 m), weight 95 lb 12.8 oz (43.5 kg), SpO2 100 %.  HEENT: Upper denture plate, oropharynx without visible mass, neck without mass Lungs: Clear bilaterally, no respiratory distress Cardiac: Regular rate and rhythm Abdomen: No hepatosplenomegaly, healed surgical incision with Steri-Strips in place, no mass  Vascular: No leg edema Lymph nodes: No cervical, supraclavicular, unguinal nodes.  "Shotty "bilateral axillary nodes Neurologic: Alert and oriented, the motor exam appears intact in the upper and lower extremities bilaterally Skin: No rash Musculoskeletal: No spine tenderness   LAB:  CBC  Lab Results  Component Value Date   WBC 9.7 02/15/2019   HGB 8.4 (L) 02/15/2019   HCT 28.5 (L) 02/15/2019   MCV 91.6 02/15/2019   PLT 509 (H) 02/15/2019   NEUTROABS 3.8 12/15/2018        CMP  Lab Results  Component Value Date   NA 136 02/15/2019   K 4.4 02/15/2019   CL 105 02/15/2019   CO2 24 02/15/2019   GLUCOSE 76 02/15/2019   BUN 11 02/15/2019   CREATININE 0.66 02/15/2019   CALCIUM 8.0 (L) 02/15/2019   PROT 6.6 02/14/2019   ALBUMIN 3.4 (L) 02/14/2019   AST 15 02/14/2019   ALT 13 02/14/2019   ALKPHOS 65 02/14/2019   BILITOT 0.2 (L) 02/14/2019   GFRNONAA >60 02/15/2019   GFRAA >60 02/15/2019   CEA on 01/23/2019-1.3 Imaging:  CT images from 09/22/2018-reviewed   Assessment/Plan:   1. Colon cancer, cecum, stage IIIc (T3N2), status post a right colectomy 02/06/2019  4/24 lymph nodes positive, 1 tumor deposit, MSI-stable, no loss of mismatch repair protein expression  CT abdomen/pelvis 09/22/2018-no acute finding, 10 mm  right hepatic dome hypodensity-suspected cyst, nonobstructing right nephrolithiasis 2. Postoperative ileus-resolved following NG tube decompression 3. Iron deficiency anemia secondary to #1 4. Atrial fibrillation-maintained on apixaban 5. Family history of colon and esophagus cancer   Disposition:   Ms. Scaffidi has been diagnosed with stage III colon cancer.  I discussed the prognosis and adjuvant treatment options with Ms. Mealor and her husband.  We reviewed details of the surgical pathology report.  She has a significant chance of developing recurrent colon cancer over the next few years.  We discussed the benefit associated with 5-fluorouracil-based adjuvant chemotherapy in this setting.  There is no clear benefit associated with oxaliplatin based therapy in patients over 70.  I recommend adjuvant capecitabine.  I reviewed potential toxicities associated with capecitabine including the chance for nausea, mucositis, diarrhea, cardiac toxicity, and hematologic toxicity.  We discussed the sun sensitivity, hyperpigmentation, rash, and hand/foot syndrome  associated with capecitabine.  She will attend a chemotherapy teaching class.  Ms. Donavan does not appear to have hereditary non-polyposis colon cancer syndrome.  However her family is at increased risk of developing colorectal cancer and should receive appropriate screening.  She has a family history of colon and esophagus cancer.  I will present her case at the GI tumor conference.  I will refer her for a staging chest CT.  Ms. Donnellan is undecided on adjuvant therapy.  She would like to discuss things with her family over the next few days.  She will be scheduled for a chemotherapy teaching class and lab visit 02/28/2019.  We anticipate beginning Xeloda on 03/12/2019 if she decides to proceed with adjuvant therapy.  [name], MD  02/26/2019, 3:45 PM

## 2019-02-26 NOTE — Telephone Encounter (Signed)
Gave patient avs report and appointments for March and April.  °

## 2019-02-27 ENCOUNTER — Telehealth: Payer: Self-pay | Admitting: Pharmacist

## 2019-02-27 DIAGNOSIS — C182 Malignant neoplasm of ascending colon: Secondary | ICD-10-CM

## 2019-02-27 MED ORDER — CAPECITABINE 500 MG PO TABS: ORAL_TABLET | ORAL | 0 refills | Status: DC

## 2019-02-27 NOTE — Telephone Encounter (Signed)
Oral Oncology Pharmacist Encounter  Received new prescription for Xeloda (capecitabine) for the adjuvant treatment of stage III colon cancer, planned duration 6 months of therapy (8 planned cycles).  Original diagnosis in Feb 2020 Patient is s/p resection performed 02/06/19 Patient is under evaluation to receive adjuvant treatment with Xeloda monotherapy.  It is noted that patient is unsure about additional treatment.  Labs from 02/15/19 assessed, OK for treatment. SCr=0.66, round to 1.0 for age > 64, est CrCl ~ 30 mL/min Literature states dose should be reduced to 75% of original dose for CrCl 30-50 mL/min  Xeloda prescription currently dosed at ~900 mg/m2 by mouth twice daily for 14 days on, 7 days off, and repeated Dose will be discussed with MD  Current medication list in Epic reviewed, no DDIs with Xeloda identified.  Prescription will be e-scribed to the [facility] for benefits analysis and approval.  Oral Oncology Clinic will continue to follow for insurance authorization, copayment issues, initial counseling and start date.  I will plan to see patient at chemo education class scheduled for 02/28/19 at [place], PharmD, BCPS, BCOP  02/27/2019 7:53 AM Oral Oncology Clinic [phone]

## 2019-02-27 NOTE — Telephone Encounter (Signed)
Oral Oncology Pharmacist Encounter  Discussed dosing with MD. Will reduce dose by 25%  Xeloda is now planned to be dosed at ~625 mg/m2 by mouth twice daily for 14 days on, 7 days off and repeated.  Prescription for Xeloda 500mg  tablets, take 2 tablets (1000mg ) by mouth 2 times daily, immediately after food, take for 14 days on, 7 days off, repeated every 21 days, quantity #56, refills = 0, has been e-scribed to the [facility].  Insurance authorization is not required at this time.  Copayment will be fully covered by patient's supplemental insurance for $0 copayment due at the pharmacy for each fill.  Dose may increase after cycle 1 based on  Toleration.  The above will be discussed with patient during chemotherapy education class on 02/28/19.  Johny Drilling, PharmD, BCPS, BCOP  02/27/2019 10:09 AM Oral Oncology Clinic [phone]

## 2019-02-28 ENCOUNTER — Inpatient Hospital Stay: Payer: Medicare Other

## 2019-02-28 ENCOUNTER — Other Ambulatory Visit: Payer: Self-pay

## 2019-02-28 DIAGNOSIS — C180 Malignant neoplasm of cecum: Secondary | ICD-10-CM | POA: Diagnosis present

## 2019-02-28 DIAGNOSIS — C182 Malignant neoplasm of ascending colon: Secondary | ICD-10-CM

## 2019-02-28 LAB — CBC WITH DIFFERENTIAL (CANCER CENTER ONLY)
Abs Immature Granulocytes: 0.03 10*3/uL (ref 0.00–0.07)
Basophils Absolute: 0.1 10*3/uL (ref 0.0–0.1)
Basophils Relative: 1 %
Eosinophils Absolute: 0.1 10*3/uL (ref 0.0–0.5)
Eosinophils Relative: 1 %
HCT: 28.1 % — ABNORMAL LOW (ref 36.0–46.0)
Hemoglobin: 8.5 g/dL — ABNORMAL LOW (ref 12.0–15.0)
Immature Granulocytes: 0 %
Lymphocytes Relative: 16 %
Lymphs Abs: 1.2 10*3/uL (ref 0.7–4.0)
MCH: 26.6 pg (ref 26.0–34.0)
MCHC: 30.2 g/dL (ref 30.0–36.0)
MCV: 88.1 fL (ref 80.0–100.0)
Monocytes Absolute: 0.7 10*3/uL (ref 0.1–1.0)
Monocytes Relative: 9 %
Neutro Abs: 5.6 10*3/uL (ref 1.7–7.7)
Neutrophils Relative %: 73 %
Platelet Count: 555 10*3/uL — ABNORMAL HIGH (ref 150–400)
RBC: 3.19 MIL/uL — ABNORMAL LOW (ref 3.87–5.11)
RDW: 13.6 % (ref 11.5–15.5)
WBC Count: 7.6 10*3/uL (ref 4.0–10.5)
nRBC: 0 % (ref 0.0–0.2)

## 2019-02-28 LAB — CMP (CANCER CENTER ONLY)
ALT: 9 U/L (ref 0–44)
AST: 14 U/L — ABNORMAL LOW (ref 15–41)
Albumin: 3.5 g/dL (ref 3.5–5.0)
Alkaline Phosphatase: 87 U/L (ref 38–126)
Anion gap: 9 (ref 5–15)
BUN: 17 mg/dL (ref 8–23)
CO2: 26 mmol/L (ref 22–32)
Calcium: 8.4 mg/dL — ABNORMAL LOW (ref 8.9–10.3)
Chloride: 106 mmol/L (ref 98–111)
Creatinine: 0.84 mg/dL (ref 0.44–1.00)
GFR, Est AFR Am: >60 mL/min (ref >60)
GFR, Estimated: >60 mL/min (ref >60)
Glucose, Bld: 98 mg/dL (ref 70–99)
Potassium: 4.4 mmol/L (ref 3.5–5.1)
Sodium: 141 mmol/L (ref 135–145)
Total Bilirubin: 0.3 mg/dL (ref 0.3–1.2)
Total Protein: 6.6 g/dL (ref 6.5–8.1)

## 2019-02-28 NOTE — Telephone Encounter (Signed)
Oral Chemotherapy Pharmacist Encounter   I spoke with patient and husband for face to face counselingoverview of: Xeloda (capecitabine) for the adjuvant treatment of stage III colon cancer, planned duration 6 months of therapy (8 planned cycles).   Counseled patient on administration, dosing, side effects, monitoring, drug-food interactions, safe handling, storage, and disposal.  Patient will take Xeloda 500mg  tablets, 2 tablets (1000mg ) by mouth in AM and 2 tabs (1000mg ) by mouth in PM, within 30 minutes of finishing meals, on days 1-14 of each 21 day cycle.   Patient informed that Xeloda dose has been decreased for 1st cycle and may increase in the future based on toleration.  Xeloda start date: 03/12/19  Adverse effects include but are not limited to: fatigue, decreased blood counts, GI upset, diarrhea, mouth sores, and hand-foot syndrome.  Patient has anti-emetic on hand and knows to take it if nausea develops.   Patient will obtain anti diarrheal and alert the office of 4 or more loose stools above baseline.  Reviewed with patient importance of keeping a medication schedule and plan for any missed doses.  Kristen Cruz voiced understanding and appreciation.   All questions answered. Medication reconciliation performed and medication/allergy list updated.  Insurance authorization is not required at this time.  Copayment will be fully covered by patient's supplemental insurance for $0 copayment due at the pharmacy for each fill.  Oral oncology patient advocate will coordinate with patient and pharmacy for Xeloda to ship from the [facility] outpatient pharmacy on 3/16 to deliver on 3/17. Patient will wait until 3/23 prior to starting treatment.  Patient knows to call the office with questions or concerns. Oral Oncology Clinic will continue to follow.  Kristen Cruz, PharmD, BCPS, BCOP  02/28/2019   3:32 PM Oral Oncology Clinic [phone]

## 2019-03-05 MED FILL — XELODA 500 MG TABLET: 500 | 21 days supply | Qty: 56 | Fill #0

## 2019-03-06 ENCOUNTER — Telehealth: Payer: Self-pay

## 2019-03-06 NOTE — Telephone Encounter (Signed)
  Oncology Nurse Navigator Documentation    Called and spoke with patient to assess for navigation needs. Patient to start Xeloda on 03/12/19. No questions, concerns or barriers to tx identified. Patient encouraged to call with questions or concerns. Will follow as needed.

## 2019-03-06 NOTE — Telephone Encounter (Signed)
Oral Oncology Patient Advocate Encounter  Confirmed with [place] that Xeloda was shipped on 03/05/19 with a $0 copay using [place].   [place] Patient [place] Phone [phone] Fax [phone] 03/06/2019   8:28 AM

## 2019-03-14 ENCOUNTER — Telehealth: Payer: Self-pay | Admitting: *Deleted

## 2019-03-14 NOTE — Telephone Encounter (Signed)
Notified by nurse navigator, Dawn that patient had left message she was having pain in her chest. Called and she reports today she developed a burning in her chest with discomfort in both arms. She had a bowel movement and it got better. The pain was not severe. Her BP was 103/72 and pulse 89 during the episode. Did not have any shortness of breath or diaphoresis. She thought it may be "gas". Suggested she try Mylicon or Tums if it returns. She confirms she started the Xeloda on 03/12/19. Will notify MD of her symptoms.

## 2019-03-15 ENCOUNTER — Telehealth: Payer: Self-pay | Admitting: *Deleted

## 2019-03-15 NOTE — Telephone Encounter (Signed)
Called patient back to inform her that Dr. Benay Spice wants her to call office for any recurrent chest pain. She then states, "it's not really chest pain. It's more like a burning that starts in my elbows and goes up to my chest". Taking a Tums tends to resolve the discomfort within 15 minutes. Denies any cardiac symptoms. Instructed her to monitor the occurrences and when it occurs. If there is any progression in her symptoms or Tums stop working to stop the Xeloda and call. She understands and agrees.

## 2019-03-16 ENCOUNTER — Telehealth: Payer: Self-pay

## 2019-03-16 ENCOUNTER — Ambulatory Visit: Payer: Medicare Other | Admitting: Family Medicine

## 2019-03-16 NOTE — Telephone Encounter (Signed)
Opened in error

## 2019-03-24 ENCOUNTER — Other Ambulatory Visit: Payer: Self-pay | Admitting: Oncology

## 2019-03-24 DIAGNOSIS — C182 Malignant neoplasm of ascending colon: Secondary | ICD-10-CM

## 2019-03-27 ENCOUNTER — Other Ambulatory Visit: Payer: Self-pay

## 2019-03-27 ENCOUNTER — Ambulatory Visit (INDEPENDENT_AMBULATORY_CARE_PROVIDER_SITE_OTHER): Payer: Medicare Other | Admitting: Family Medicine

## 2019-03-27 ENCOUNTER — Ambulatory Visit: Payer: Medicare Other | Admitting: Family Medicine

## 2019-03-27 DIAGNOSIS — E538 Deficiency of other specified B group vitamins: Secondary | ICD-10-CM | POA: Diagnosis not present

## 2019-03-27 DIAGNOSIS — I10 Essential (primary) hypertension: Secondary | ICD-10-CM

## 2019-03-27 DIAGNOSIS — D509 Iron deficiency anemia, unspecified: Secondary | ICD-10-CM | POA: Diagnosis not present

## 2019-03-27 DIAGNOSIS — C182 Malignant neoplasm of ascending colon: Secondary | ICD-10-CM | POA: Diagnosis not present

## 2019-03-27 NOTE — Progress Notes (Signed)
Patient ID: Kristen Cruz, female   DOB: 06-12-1936, 83 y.o.   MRN: [id number]  Virtual Visit via Video Note  I connected with Kristen Cruz on 03/27/19 at  2:30 PM EDT by a video enabled telemedicine application and verified that I am speaking with the correct person using two identifiers.  Location patient: home Location provider:work or home office Persons participating in the virtual visit: patient, provider She was unable to access camera for Doxy-me visit and follow up was by audio means.  I discussed the limitations of evaluation and management by telemedicine and the availability of in person appointments. The patient expressed understanding and agreed to proceed.   HPI: Kristen Cruz is seen for medical follow-up.  She has history of atrial fibrillation, hypertension, and low B12 level.  She is treated with Eliquis for atrial fibrillation.  She saw her cardiologist back in October 2019 and had hemoglobin 7.6 with microcytosis with MCV of 71.  Ferritin was 3.  She was admitted and transfused with blood.  CT abdomen pelvis revealed suspected cyst right hepatic dome.  She underwent colonoscopy and February she had a cecal mass found and biopsy revealed invasive well-differentiated adenocarcinoma.  She had tubular adenomas of the transverse colon.  On February 18 she had right colectomy.  No evidence for metastatic disease to the liver.  Patient reports that 4 out of 24 lymph nodes were positive.  She was unfortunately readmitted on 02/17/2019 with postoperative small bowel obstruction.  This resolved with conservative management.  She is doing fairly well at this time.  Appetite is fair.  Patient had B12 level 122 back in October.  She is been taking over-the-counter B12 thousand micrograms daily.  Needs follow-up levels.  She has scheduled labs this Thursday with oncology.  She is undergoing chemotherapy and that will go on for the next few months.  She is naturally nervous because of  current coronavirus pandemic  ROS: See pertinent positives and negatives per HPI.  Past Medical History:  Diagnosis Date  . Anticoagulant long-term use    eliquis  . Arthritis   . Cecum mass   . History of GI bleed 09/22/2018   upper  . HOH (hard of hearing)   . Hypertension   . IDA (iron deficiency anemia)   . Persistent atrial fibrillation cardiologist-- dr Radford Pax   CHADS2VASC score is 4 on Apixaban  . PONV (postoperative nausea and vomiting)   . Rash    "allergic to my on body tempurature , if I have fever I break out in a rash"  . Right nephrolithiasis    per CT 09-22-2018  . Wears dentures    upper  . Wears glasses     Past Surgical History:  Procedure Laterality Date  . ABDOMINAL HYSTERECTOMY  1978   ovaries remain  . BREAST CYST EXCISION Right yrs ago   benign  . CATARACT EXTRACTION W/ INTRAOCULAR LENS  IMPLANT, BILATERAL  2014  approx.  . COLONOSCOPY  01-22-2019   dr Watt Climes  . PARTIAL COLECTOMY Right 02/06/2019   Procedure: OPEN RIGHT COLECTOMY;  Surgeon: Armandina Gemma, MD;  Location: [facility];  Service: General;  Laterality: Right;    Family History  Problem Relation Age of Onset  . Hypertension Mother   . Heart Problems Mother   . Heart attack Mother   . Cancer Sister   . Cancer Brother     SOCIAL HX: Non-smoker   Current Outpatient Medications:  .  apixaban (ELIQUIS) 2.5 MG TABS  tablet, Take 1 tablet (2.5 mg total) by mouth 2 (two) times daily., Disp: 60 tablet, Rfl: 5 .  [START ON 04/02/2019] capecitabine (XELODA) 500 MG tablet, TAKE 2 TABLETS (1000MG ) BY MOUTH 2 TIMES DAILY, IMMEDIATELY AFTER FOOD. TAKE FOR 14 DAYS ON, 7 DAYS OFF, REPEAT EVERY 21 DAYS., Disp: 56 tablet, Rfl: 0 .  carboxymethylcellulose (REFRESH PLUS) 0.5 % SOLN, 1 drop daily as needed (dry eyes)., Disp: , Rfl:  .  DILT-XR 180 MG 24 hr capsule, TAKE 1 CAPSULE BY MOUTH EVERY DAY (Patient taking differently: Take 180 mg by mouth daily. ), Disp: 30 capsule, Rfl: 7 .  ferrous sulfate 325 (65  FE) MG EC tablet, Take 1 tablet (325 mg total) by mouth 2 (two) times daily. (Patient not taking: Reported on 02/26/2019), Disp: 60 tablet, Rfl: 3 .  ondansetron (ZOFRAN-ODT) 4 MG disintegrating tablet, Take 4 mg by mouth every 6 (six) hours as needed for nausea or vomiting., Disp: , Rfl:  .  vitamin B-12 (CYANOCOBALAMIN) 1000 MCG tablet, Take 1,000 mcg by mouth daily., Disp: , Rfl:  .  vitamin C (ASCORBIC ACID) 250 MG tablet, Take 1 tablet (250 mg total) by mouth daily., Disp: , Rfl:   EXAM:  VITALS per patient if applicable:  GENERAL: alert, oriented, appears well and in no acute distress  HEENT: atraumatic, conjunttiva clear, no obvious abnormalities on inspection of external nose and ears  NECK: normal movements of the head and neck  LUNGS: on inspection no signs of respiratory distress, breathing rate appears normal, no obvious gross SOB, gasping or wheezing  CV: no obvious cyanosis  MS: moves all visible extremities without noticeable abnormality  PSYCH/NEURO: pleasant and cooperative, no obvious depression or anxiety, speech and thought processing grossly intact  ASSESSMENT AND PLAN:  Discussed the following assessment and plan:  #1 colon cancer involving the cecum, stage IIIc.  History of right hemicolectomy 02/06/2019 -Continue close follow-up with oncology  #2 iron deficiency anemia secondary to #1 -Continue iron replacement as tolerated  #3 history of B12 deficiency.  Patient on oral replacement -Put in order for future lab for B12 level to be obtained hopefully this Thursday when she is at oncology  #4 chronic atrial fibrillation maintained on Eliquis     I discussed the assessment and treatment plan with the patient. The patient was provided an opportunity to ask questions and all were answered. The patient agreed with the plan and demonstrated an understanding of the instructions.   The patient was advised to call back or seek an in-person evaluation if the  symptoms worsen or if the condition fails to improve as anticipated.  Carolann Littler, MD

## 2019-03-28 ENCOUNTER — Ambulatory Visit (HOSPITAL_COMMUNITY)
Admission: RE | Admit: 2019-03-28 | Discharge: 2019-03-28 | Disposition: A | Discharge status: Home / Self Care | Payer: Medicare Other | Source: Ambulatory Visit | Attending: Oncology | Admitting: Oncology

## 2019-03-28 ENCOUNTER — Other Ambulatory Visit: Payer: Self-pay

## 2019-03-28 DIAGNOSIS — C182 Malignant neoplasm of ascending colon: Secondary | ICD-10-CM | POA: Diagnosis not present

## 2019-03-29 ENCOUNTER — Inpatient Hospital Stay: Payer: Medicare Other

## 2019-03-29 ENCOUNTER — Telehealth: Payer: Self-pay | Admitting: Oncology

## 2019-03-29 ENCOUNTER — Inpatient Hospital Stay: Payer: Medicare Other | Attending: Oncology | Admitting: Oncology

## 2019-03-29 ENCOUNTER — Other Ambulatory Visit: Payer: Self-pay

## 2019-03-29 VITALS — BP 177/87 | HR 74 | Temp 98.3°F | Resp 17 | Ht 62.5 in | Wt 93.2 lb

## 2019-03-29 DIAGNOSIS — R197 Diarrhea, unspecified: Secondary | ICD-10-CM | POA: Insufficient documentation

## 2019-03-29 DIAGNOSIS — R911 Solitary pulmonary nodule: Secondary | ICD-10-CM | POA: Diagnosis not present

## 2019-03-29 DIAGNOSIS — C182 Malignant neoplasm of ascending colon: Secondary | ICD-10-CM

## 2019-03-29 DIAGNOSIS — I4891 Unspecified atrial fibrillation: Secondary | ICD-10-CM | POA: Insufficient documentation

## 2019-03-29 DIAGNOSIS — D509 Iron deficiency anemia, unspecified: Secondary | ICD-10-CM | POA: Diagnosis not present

## 2019-03-29 DIAGNOSIS — C772 Secondary and unspecified malignant neoplasm of intra-abdominal lymph nodes: Secondary | ICD-10-CM | POA: Insufficient documentation

## 2019-03-29 DIAGNOSIS — C180 Malignant neoplasm of cecum: Secondary | ICD-10-CM

## 2019-03-29 DIAGNOSIS — D518 Other vitamin B12 deficiency anemias: Secondary | ICD-10-CM

## 2019-03-29 LAB — CBC WITH DIFFERENTIAL (CANCER CENTER ONLY)
Abs Immature Granulocytes: 0.05 10*3/uL (ref 0.00–0.07)
Basophils Absolute: 0.1 10*3/uL (ref 0.0–0.1)
Basophils Relative: 1 %
Eosinophils Absolute: 0 10*3/uL (ref 0.0–0.5)
Eosinophils Relative: 0 %
HCT: 29.2 % — ABNORMAL LOW (ref 36.0–46.0)
Hemoglobin: 9.1 g/dL — ABNORMAL LOW (ref 12.0–15.0)
Immature Granulocytes: 1 %
Lymphocytes Relative: 20 %
Lymphs Abs: 1.1 10*3/uL (ref 0.7–4.0)
MCH: 26.9 pg (ref 26.0–34.0)
MCHC: 31.2 g/dL (ref 30.0–36.0)
MCV: 86.4 fL (ref 80.0–100.0)
Monocytes Absolute: 0.6 10*3/uL (ref 0.1–1.0)
Monocytes Relative: 12 %
Neutro Abs: 3.6 10*3/uL (ref 1.7–7.7)
Neutrophils Relative %: 66 %
Platelet Count: 464 10*3/uL — ABNORMAL HIGH (ref 150–400)
RBC: 3.38 MIL/uL — ABNORMAL LOW (ref 3.87–5.11)
RDW: 16.6 % — ABNORMAL HIGH (ref 11.5–15.5)
WBC Count: 5.4 10*3/uL (ref 4.0–10.5)
nRBC: 0 % (ref 0.0–0.2)

## 2019-03-29 LAB — CMP (CANCER CENTER ONLY)
ALT: 11 U/L (ref 0–44)
AST: 17 U/L (ref 15–41)
Albumin: 4 g/dL (ref 3.5–5.0)
Alkaline Phosphatase: 81 U/L (ref 38–126)
Anion gap: 10 (ref 5–15)
BUN: 9 mg/dL (ref 8–23)
CO2: 24 mmol/L (ref 22–32)
Calcium: 9 mg/dL (ref 8.9–10.3)
Chloride: 107 mmol/L (ref 98–111)
Creatinine: 0.77 mg/dL (ref 0.44–1.00)
GFR, Est AFR Am: >60 mL/min
GFR, Estimated: >60 mL/min
Glucose, Bld: 101 mg/dL — ABNORMAL HIGH (ref 70–99)
Potassium: 3.7 mmol/L (ref 3.5–5.1)
Sodium: 141 mmol/L (ref 135–145)
Total Bilirubin: 0.4 mg/dL (ref 0.3–1.2)
Total Protein: 7.1 g/dL (ref 6.5–8.1)

## 2019-03-29 MED FILL — XELODA 500 MG TABLET: 500 | 21 days supply | Qty: 56 | Fill #0

## 2019-03-29 NOTE — Progress Notes (Signed)
[place] OFFICE PROGRESS NOTE   Diagnosis: Colon cancer  INTERVAL HISTORY:   Kristen Cruz returns as scheduled.  She completed a first cycle of adjuvant Xeloda beginning 03/12/2019.  No mouth sores.  She reports an ulceration at the lower lip that has been a chronic problem.  She had a few episodes of diarrhea.  This occurs in the morning.  She had an episode of chest and bilateral arm "burning "that resolved after taking an antacid.  Objective:  Vital signs in last 24 hours:  Blood pressure (!) 177/87, pulse 74, temperature 98.3 F (36.8 C), temperature source Oral, resp. rate 17, height 5' 2.5" (1.588 m), weight 93 lb 3.2 oz (42.3 kg), SpO2 99 %.    HEENT: No thrush or ulcers, dryness at the lower lip Resp: Lungs clear bilaterally Cardio: Regular rate and rhythm GI: No hepatomegaly, nontender, healed surgical incision Vascular: No leg edema Skin: Palms without erythema, arms without rash   Lab Results:  Lab Results  Component Value Date   WBC 5.4 03/29/2019   HGB 9.1 (L) 03/29/2019   HCT 29.2 (L) 03/29/2019   MCV 86.4 03/29/2019   PLT 464 (H) 03/29/2019   NEUTROABS 3.6 03/29/2019    CMP  Lab Results  Component Value Date   NA 141 03/29/2019   K 3.7 03/29/2019   CL 107 03/29/2019   CO2 24 03/29/2019   GLUCOSE 101 (H) 03/29/2019   BUN 9 03/29/2019   CREATININE 0.77 03/29/2019   CALCIUM 9.0 03/29/2019   PROT 7.1 03/29/2019   ALBUMIN 4.0 03/29/2019   AST 17 03/29/2019   ALT 11 03/29/2019   ALKPHOS 81 03/29/2019   BILITOT 0.4 03/29/2019   GFRNONAA >60 03/29/2019   GFRAA >60 03/29/2019     Imaging:  Ct Chest Wo Contrast  Result Date: 03/28/2019 CLINICAL DATA:  Cecal adenocarcinoma. EXAM: CT CHEST WITHOUT CONTRAST TECHNIQUE: Multidetector CT imaging of the chest was performed following the standard protocol without IV contrast. COMPARISON:  Abdomen/pelvis CT 02/14/2019 FINDINGS: Cardiovascular: Heart size upper normal. Coronary artery  calcification is evident. Atherosclerotic calcification is noted in the wall of the thoracic aorta. Mediastinum/Nodes: No mediastinal lymphadenopathy. No evidence for gross hilar lymphadenopathy although assessment is limited by the lack of intravenous contrast on today's study. There is no axillary lymphadenopathy. The esophagus has normal imaging features. Lungs/Pleura: The central tracheobronchial airways are patent. Biapical pleuroparenchymal scarring evident. 7 x 7 mm right middle lobe perifissural nodule has a relatively flat (platelike) appearance on sagittal imaging. No overtly suspicious nodule or mass in either lung. Scattered areas of subsegmental atelectasis and small airway impaction evident bilaterally. No pleural effusion. Upper Abdomen: Unremarkable. Musculoskeletal: No worrisome lytic or sclerotic osseous abnormality. Small sclerotic lesion inferior right glenoid is likely a bone island. IMPRESSION: 1. 7 x 7 mm perifissural right middle lobe pulmonary nodule has a relatively flat appearance on sagittal imaging. This may be atelectasis or scarring. Consider follow-up CT chest in 3 months to ensure stability. 2. No definite findings to suggest metastatic disease in the thorax. 3. Small sclerotic lesion inferior right glenoid is probably a bone island. This could be reassessed at the time of follow-up CT. 4.  Aortic Atherosclerois (ICD10-170.0) 1. Electronically Signed   By: Misty Stanley M.D.   On: 03/28/2019 15:29    Medications: I have reviewed the patient's current medications.   Assessment/Plan:  1. Colon cancer, cecum, stage IIIc (T3N2), status post a right colectomy 02/06/2019  4/24 lymph nodes positive, 1  tumor deposit, MSI-stable, no loss of mismatch repair protein expression  CT abdomen/pelvis 09/22/2018-no acute finding, 10 mm right hepatic dome hypodensity-suspected cyst, nonobstructing right nephrolithiasis  CT chest 03/28/2019- perifissural right middle lobe nodule-potentially  atelectasis or scarring  Cycle 1 adjuvant Xeloda 03/12/2019 2. Postoperative ileus-resolved following NG tube decompression 3. Iron deficiency anemia secondary to #1 4. Atrial fibrillation-maintained on apixaban 5. Family history of colon and esophagus cancer 6. 7 mm right middle lobe nodule on chest CT 03/28/2019   Disposition: Kristen Cruz has completed 1 cycle of adjuvant Xeloda.  She will begin cycle 2 on 04/02/2019.  She will use Imodium for diarrhea.  She will contact us for increased diarrhea. I recommended she begin iron replacement.  I reviewed the chest CT images.  The right middle lobe nodule has an inflammatory appearance.  We will plan for a repeat CT in 3-4 months.  Ms. [name] will return for an office and lab visit in 3 weeks.  [name], MD  03/29/2019  12:09 PM

## 2019-03-29 NOTE — Telephone Encounter (Signed)
Scheduled appt per 4/9 los. ° °Patient aware of appt. °

## 2019-04-13 ENCOUNTER — Other Ambulatory Visit: Payer: Self-pay | Admitting: Oncology

## 2019-04-13 DIAGNOSIS — C182 Malignant neoplasm of ascending colon: Secondary | ICD-10-CM

## 2019-04-19 ENCOUNTER — Inpatient Hospital Stay (HOSPITAL_BASED_OUTPATIENT_CLINIC_OR_DEPARTMENT_OTHER): Payer: Medicare Other | Admitting: Nurse Practitioner

## 2019-04-19 ENCOUNTER — Telehealth: Payer: Self-pay | Admitting: Nurse Practitioner

## 2019-04-19 ENCOUNTER — Inpatient Hospital Stay: Payer: Medicare Other

## 2019-04-19 ENCOUNTER — Encounter: Payer: Self-pay | Admitting: Nurse Practitioner

## 2019-04-19 ENCOUNTER — Other Ambulatory Visit: Payer: Self-pay

## 2019-04-19 ENCOUNTER — Telehealth: Payer: Self-pay

## 2019-04-19 VITALS — BP 151/76 | HR 100 | Temp 98.1°F | Resp 18 | Ht 62.5 in | Wt 93.4 lb

## 2019-04-19 DIAGNOSIS — C772 Secondary and unspecified malignant neoplasm of intra-abdominal lymph nodes: Secondary | ICD-10-CM

## 2019-04-19 DIAGNOSIS — R110 Nausea: Secondary | ICD-10-CM

## 2019-04-19 DIAGNOSIS — C182 Malignant neoplasm of ascending colon: Secondary | ICD-10-CM | POA: Diagnosis not present

## 2019-04-19 DIAGNOSIS — I4891 Unspecified atrial fibrillation: Secondary | ICD-10-CM

## 2019-04-19 DIAGNOSIS — R911 Solitary pulmonary nodule: Secondary | ICD-10-CM

## 2019-04-19 DIAGNOSIS — D518 Other vitamin B12 deficiency anemias: Secondary | ICD-10-CM

## 2019-04-19 DIAGNOSIS — D509 Iron deficiency anemia, unspecified: Secondary | ICD-10-CM

## 2019-04-19 DIAGNOSIS — C180 Malignant neoplasm of cecum: Secondary | ICD-10-CM | POA: Diagnosis not present

## 2019-04-19 LAB — CBC WITH DIFFERENTIAL (CANCER CENTER ONLY)
Abs Immature Granulocytes: 0.03 10*3/uL (ref 0.00–0.07)
Basophils Absolute: 0 10*3/uL (ref 0.0–0.1)
Basophils Relative: 1 %
Eosinophils Absolute: 0 10*3/uL (ref 0.0–0.5)
Eosinophils Relative: 0 %
HCT: 28 % — ABNORMAL LOW (ref 36.0–46.0)
Hemoglobin: 8.7 g/dL — ABNORMAL LOW (ref 12.0–15.0)
Immature Granulocytes: 1 %
Lymphocytes Relative: 22 %
Lymphs Abs: 1.2 10*3/uL (ref 0.7–4.0)
MCH: 26.7 pg (ref 26.0–34.0)
MCHC: 31.1 g/dL (ref 30.0–36.0)
MCV: 85.9 fL (ref 80.0–100.0)
Monocytes Absolute: 0.7 10*3/uL (ref 0.1–1.0)
Monocytes Relative: 12 %
Neutro Abs: 3.5 10*3/uL (ref 1.7–7.7)
Neutrophils Relative %: 64 %
Platelet Count: 366 10*3/uL (ref 150–400)
RBC: 3.26 MIL/uL — ABNORMAL LOW (ref 3.87–5.11)
RDW: 21 % — ABNORMAL HIGH (ref 11.5–15.5)
WBC Count: 5.4 10*3/uL (ref 4.0–10.5)
nRBC: 0 % (ref 0.0–0.2)

## 2019-04-19 LAB — CMP (CANCER CENTER ONLY)
ALT: 10 U/L (ref 0–44)
AST: 15 U/L (ref 15–41)
Albumin: 3.8 g/dL (ref 3.5–5.0)
Alkaline Phosphatase: 73 U/L (ref 38–126)
Anion gap: 11 (ref 5–15)
BUN: 13 mg/dL (ref 8–23)
CO2: 25 mmol/L (ref 22–32)
Calcium: 8.6 mg/dL — ABNORMAL LOW (ref 8.9–10.3)
Chloride: 105 mmol/L (ref 98–111)
Creatinine: 0.82 mg/dL (ref 0.44–1.00)
GFR, Est AFR Am: >60 mL/min (ref >60)
GFR, Estimated: >60 mL/min (ref >60)
Glucose, Bld: 99 mg/dL (ref 70–99)
Potassium: 4.2 mmol/L (ref 3.5–5.1)
Sodium: 141 mmol/L (ref 135–145)
Total Bilirubin: 0.3 mg/dL (ref 0.3–1.2)
Total Protein: 6.9 g/dL (ref 6.5–8.1)

## 2019-04-19 LAB — VITAMIN B12: Vitamin B-12: 463 pg/mL (ref 180–914)

## 2019-04-19 MED FILL — XELODA 500 MG TABLET: 500 | 21 days supply | Qty: 56 | Fill #0

## 2019-04-19 NOTE — Telephone Encounter (Signed)
Called and scheduled appt per 4/30 los.  Patient aware of appt date and time.

## 2019-04-19 NOTE — Telephone Encounter (Signed)
**Note De-Identified [name] Obfuscation** I did a Eliquis PA through covermymeds. Key: [id number]  I received an immediate approval as follows:  K[facility]en [facility] Key: [id number] - PA Case ID: [id number] Outcome  Approved today  Case [id number];PYPPJK:[id number];Review Type:Prior Auth;Coverage Start Date: 03/20/2019 and Coverage End Date:04/18/2020  I have notified [name] of this approval and she states that it is now going through for $10.

## 2019-04-19 NOTE — Progress Notes (Addendum)
  [place] OFFICE PROGRESS NOTE   Diagnosis: Colon cancer  INTERVAL HISTORY:   Kristen Cruz returns as scheduled.  She completed cycle 2 adjuvant Xeloda beginning 04/02/2019.  She has very mild intermittent nausea.  No vomiting.  She has occasional loose stools.  No mouth sores.  She note noted mild tenderness at the fingertips earlier this week when she placed her hands in hot water.  No problems with feet.  She has not resumed oral iron.  Objective:  Vital signs in last 24 hours:  Blood pressure (!) 151/76, pulse 100, temperature 98.1 F (36.7 C), temperature source Oral, resp. rate 18, height 5' 2.5" (1.588 m), weight 93 lb 6.4 oz (42.4 kg), SpO2 100 %.    HEENT: No thrush or ulcers. Vascular: No leg edema. Skin: Palms and soles with mild erythema, skin thickening.  No skin breakdown.   Lab Results:  Lab Results  Component Value Date   WBC 5.4 04/19/2019   HGB 8.7 (L) 04/19/2019   HCT 28.0 (L) 04/19/2019   MCV 85.9 04/19/2019   PLT 366 04/19/2019   NEUTROABS 3.5 04/19/2019    Imaging:  No results found.  Medications: I have reviewed the patient's current medications.  Assessment/Plan: 1. 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 4/24 lymph nodes positive, 1 tumor deposit, MSI-stable, no loss of mismatch repair protein expression ? CT abdomen/pelvis 09/22/2018-no acute finding, 10 mm right hepatic dome hypodensity-suspected cyst, nonobstructing right nephrolithiasis ? CT chest 03/28/2019- perifissural right middle lobe nodule-potentially atelectasis or scarring ? Cycle 1 adjuvant Xeloda 03/12/2019 ? Cycle 2 adjuvant Xeloda 04/02/2019 ? Cycle 3 adjuvant Xeloda 04/23/2019 2. Postoperative ileus-resolved following NG tube decompression 3. Iron deficiency anemia secondary to #1 4. Atrial fibrillation-maintained on apixaban 5. Family history of colon and esophagus cancer 6. 7 mm right middle lobe nodule on chest CT  03/28/2019-plan for repeat CT at a 3 to 58-monthinterval  Disposition: Ms. SChabotappears stable.  She has completed 2 cycles of adjuvant Xeloda.  Plan to proceed with cycle 3 as scheduled beginning 04/23/2019.  She has mild hand-foot syndrome.  She will contact the office if the skin changes progress.    We reviewed the labs from today.  She has persistent anemia.  I encouraged her to resume oral iron.    She will return for lab and follow-up in 3 weeks.  Plan reviewed with Dr. SBenay Spice    LNed CardANP/GNP-BC   04/19/2019  12:18 PM

## 2019-05-03 ENCOUNTER — Other Ambulatory Visit: Payer: Self-pay | Admitting: Nurse Practitioner

## 2019-05-03 DIAGNOSIS — C182 Malignant neoplasm of ascending colon: Secondary | ICD-10-CM

## 2019-05-04 ENCOUNTER — Telehealth: Payer: Self-pay

## 2019-05-04 NOTE — Telephone Encounter (Signed)
Spoke with pt advised. Per Ned Card ok to refill Xeloda 500mg  tab. Advised not to start  taking medication until 05/14/19. Confirmed appointment on 5/21. Pt verbalized understanding.

## 2019-05-10 ENCOUNTER — Other Ambulatory Visit: Payer: Self-pay

## 2019-05-10 ENCOUNTER — Inpatient Hospital Stay (HOSPITAL_BASED_OUTPATIENT_CLINIC_OR_DEPARTMENT_OTHER): Payer: Medicare Other | Admitting: Oncology

## 2019-05-10 ENCOUNTER — Inpatient Hospital Stay: Payer: Medicare Other | Attending: Oncology

## 2019-05-10 ENCOUNTER — Telehealth: Payer: Self-pay | Admitting: Oncology

## 2019-05-10 VITALS — BP 167/85 | HR 102 | Temp 98.3°F | Resp 20 | Ht 62.5 in | Wt 93.9 lb

## 2019-05-10 DIAGNOSIS — C182 Malignant neoplasm of ascending colon: Secondary | ICD-10-CM

## 2019-05-10 DIAGNOSIS — D509 Iron deficiency anemia, unspecified: Secondary | ICD-10-CM | POA: Insufficient documentation

## 2019-05-10 DIAGNOSIS — I4891 Unspecified atrial fibrillation: Secondary | ICD-10-CM | POA: Diagnosis not present

## 2019-05-10 DIAGNOSIS — R911 Solitary pulmonary nodule: Secondary | ICD-10-CM

## 2019-05-10 DIAGNOSIS — C180 Malignant neoplasm of cecum: Secondary | ICD-10-CM | POA: Diagnosis not present

## 2019-05-10 DIAGNOSIS — C772 Secondary and unspecified malignant neoplasm of intra-abdominal lymph nodes: Secondary | ICD-10-CM

## 2019-05-10 DIAGNOSIS — R197 Diarrhea, unspecified: Secondary | ICD-10-CM

## 2019-05-10 LAB — CBC WITH DIFFERENTIAL (CANCER CENTER ONLY)
Abs Immature Granulocytes: 0.08 10*3/uL — ABNORMAL HIGH (ref 0.00–0.07)
Basophils Absolute: 0.1 10*3/uL (ref 0.0–0.1)
Basophils Relative: 1 %
Eosinophils Absolute: 0.1 10*3/uL (ref 0.0–0.5)
Eosinophils Relative: 1 %
HCT: 32.3 % — ABNORMAL LOW (ref 36.0–46.0)
Hemoglobin: 10.2 g/dL — ABNORMAL LOW (ref 12.0–15.0)
Immature Granulocytes: 1 %
Lymphocytes Relative: 20 %
Lymphs Abs: 1.6 10*3/uL (ref 0.7–4.0)
MCH: 27.8 pg (ref 26.0–34.0)
MCHC: 31.6 g/dL (ref 30.0–36.0)
MCV: 88 fL (ref 80.0–100.0)
Monocytes Absolute: 0.8 10*3/uL (ref 0.1–1.0)
Monocytes Relative: 10 %
Neutro Abs: 5.6 10*3/uL (ref 1.7–7.7)
Neutrophils Relative %: 67 %
Platelet Count: 395 10*3/uL (ref 150–400)
RBC: 3.67 MIL/uL — ABNORMAL LOW (ref 3.87–5.11)
WBC Count: 8.2 10*3/uL (ref 4.0–10.5)
nRBC: 0 % (ref 0.0–0.2)

## 2019-05-10 LAB — CMP (CANCER CENTER ONLY)
ALT: 14 U/L (ref 0–44)
AST: 16 U/L (ref 15–41)
Albumin: 4.1 g/dL (ref 3.5–5.0)
Alkaline Phosphatase: 89 U/L (ref 38–126)
Anion gap: 9 (ref 5–15)
BUN: 13 mg/dL (ref 8–23)
CO2: 23 mmol/L (ref 22–32)
Calcium: 9.1 mg/dL (ref 8.9–10.3)
Chloride: 107 mmol/L (ref 98–111)
Creatinine: 0.87 mg/dL (ref 0.44–1.00)
GFR, Est AFR Am: >60 mL/min (ref >60)
GFR, Estimated: >60 mL/min (ref >60)
Glucose, Bld: 101 mg/dL — ABNORMAL HIGH (ref 70–99)
Potassium: 4.8 mmol/L (ref 3.5–5.1)
Sodium: 139 mmol/L (ref 135–145)
Total Bilirubin: 0.2 mg/dL — ABNORMAL LOW (ref 0.3–1.2)
Total Protein: 7.4 g/dL (ref 6.5–8.1)

## 2019-05-10 MED FILL — XELODA 500 MG TABLET: 500 | 21 days supply | Qty: 56 | Fill #0

## 2019-05-10 NOTE — Progress Notes (Signed)
  [place] OFFICE PROGRESS NOTE   Diagnosis: Colon cancer  INTERVAL HISTORY:   Kristen Cruz completed another cycle of Xeloda beginning 04/23/2019.  No mouth sores or hand/foot pain.  She has occasional mild diarrhea, relieved with Imodium.  Good appetite.  Objective:  Vital signs in last 24 hours:  Blood pressure (!) 167/85, pulse (!) 102, temperature 98.3 F (36.8 C), temperature source Oral, resp. rate 20, height 5' 2.5" (1.588 m), weight 93 lb 14.4 oz (42.6 kg), SpO2 100 %.    HEENT: No thrush or ulcers Resp: Lungs clear bilaterally Cardio: Regular rate and rhythm GI: No hepatomegaly, nontender Vascular: No leg edema  Skin: Mild erythema and hyperpigmentation of the palms and soles, no skin breakdown  Portacath/PICC-without erythema  Lab Results:  Lab Results  Component Value Date   WBC 8.2 05/10/2019   HGB 10.2 (L) 05/10/2019   HCT 32.3 (L) 05/10/2019   MCV 88.0 05/10/2019   PLT 395 05/10/2019   NEUTROABS 5.6 05/10/2019    CMP  Lab Results  Component Value Date   NA 141 04/19/2019   K 4.2 04/19/2019   CL 105 04/19/2019   CO2 25 04/19/2019   GLUCOSE 99 04/19/2019   BUN 13 04/19/2019   CREATININE 0.82 04/19/2019   CALCIUM 8.6 (L) 04/19/2019   PROT 6.9 04/19/2019   ALBUMIN 3.8 04/19/2019   AST 15 04/19/2019   ALT 10 04/19/2019   ALKPHOS 73 04/19/2019   BILITOT 0.3 04/19/2019   GFRNONAA >60 04/19/2019   GFRAA >60 04/19/2019     Medications: I have reviewed the patient's current medications.   Assessment/Plan: 1. 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 4/24 lymph nodes positive, 1 tumor deposit, MSI-stable, no loss of mismatch repair protein expression ? CT abdomen/pelvis 09/22/2018-no acute finding, 10 mm right hepatic dome hypodensity-suspected cyst, nonobstructing right nephrolithiasis ? CT chest 03/28/2019- perifissural right middle lobe nodule-potentially atelectasis or scarring ? Cycle 1  adjuvant Xeloda 03/12/2019 ? Cycle 2 adjuvant Xeloda 04/02/2019 ? Cycle 3 adjuvant Xeloda 04/23/2019 ? Cycle 4 adjuvant Xeloda 05/14/2019 2. Postoperative ileus-resolved following NG tube decompression 3. Iron deficiency anemia secondary to #1 4. Atrial fibrillation-maintained on apixaban 5. Family history of colon and esophagus cancer 6. 7 mm right middle lobe nodule on chest CT 03/28/2019-plan for repeat CT at a 3 to 46-monthinterval    Disposition: Ms. SPoirierappears stable.  She is tolerating Xeloda well.  She will complete another cycle beginning 05/14/2019.  She will return for an office and lab visit in 3 weeks.  15 minutes were spent with the patient today.  The majority of the time was used for counseling and coordination of care.  GBetsy Coder MD  05/10/2019  8:20 AM

## 2019-05-10 NOTE — Telephone Encounter (Signed)
Scheduled appt per 5/21 los. ° °A calendar will be mailed out. °

## 2019-05-17 ENCOUNTER — Other Ambulatory Visit: Payer: Self-pay | Admitting: Cardiology

## 2019-05-29 ENCOUNTER — Other Ambulatory Visit: Payer: Self-pay | Admitting: Oncology

## 2019-05-29 DIAGNOSIS — C182 Malignant neoplasm of ascending colon: Secondary | ICD-10-CM

## 2019-05-31 ENCOUNTER — Other Ambulatory Visit: Payer: Self-pay

## 2019-05-31 ENCOUNTER — Telehealth: Payer: Self-pay | Admitting: Nurse Practitioner

## 2019-05-31 ENCOUNTER — Encounter: Payer: Self-pay | Admitting: Nurse Practitioner

## 2019-05-31 ENCOUNTER — Inpatient Hospital Stay (HOSPITAL_BASED_OUTPATIENT_CLINIC_OR_DEPARTMENT_OTHER): Payer: Medicare Other | Admitting: Nurse Practitioner

## 2019-05-31 ENCOUNTER — Inpatient Hospital Stay: Payer: Medicare Other | Attending: Oncology

## 2019-05-31 VITALS — BP 160/79 | HR 92 | Temp 99.4°F | Resp 17 | Ht 62.5 in | Wt 95.9 lb

## 2019-05-31 DIAGNOSIS — D509 Iron deficiency anemia, unspecified: Secondary | ICD-10-CM | POA: Diagnosis not present

## 2019-05-31 DIAGNOSIS — I4891 Unspecified atrial fibrillation: Secondary | ICD-10-CM | POA: Diagnosis not present

## 2019-05-31 DIAGNOSIS — C182 Malignant neoplasm of ascending colon: Secondary | ICD-10-CM

## 2019-05-31 DIAGNOSIS — C180 Malignant neoplasm of cecum: Secondary | ICD-10-CM | POA: Insufficient documentation

## 2019-05-31 DIAGNOSIS — C772 Secondary and unspecified malignant neoplasm of intra-abdominal lymph nodes: Secondary | ICD-10-CM

## 2019-05-31 DIAGNOSIS — R911 Solitary pulmonary nodule: Secondary | ICD-10-CM

## 2019-05-31 LAB — CMP (CANCER CENTER ONLY)
ALT: 18 U/L (ref 0–44)
AST: 20 U/L (ref 15–41)
Albumin: 4.4 g/dL (ref 3.5–5.0)
Alkaline Phosphatase: 88 U/L (ref 38–126)
Anion gap: 9 (ref 5–15)
BUN: 12 mg/dL (ref 8–23)
CO2: 23 mmol/L (ref 22–32)
Calcium: 9 mg/dL (ref 8.9–10.3)
Chloride: 107 mmol/L (ref 98–111)
Creatinine: 0.84 mg/dL (ref 0.44–1.00)
GFR, Est AFR Am: >60 mL/min (ref >60)
GFR, Estimated: >60 mL/min (ref >60)
Glucose, Bld: 102 mg/dL — ABNORMAL HIGH (ref 70–99)
Potassium: 4.8 mmol/L (ref 3.5–5.1)
Sodium: 139 mmol/L (ref 135–145)
Total Bilirubin: 0.4 mg/dL (ref 0.3–1.2)
Total Protein: 7.3 g/dL (ref 6.5–8.1)

## 2019-05-31 LAB — CBC WITH DIFFERENTIAL (CANCER CENTER ONLY)
Abs Immature Granulocytes: 0.08 10*3/uL — ABNORMAL HIGH (ref 0.00–0.07)
Basophils Absolute: 0.1 10*3/uL (ref 0.0–0.1)
Basophils Relative: 1 %
Eosinophils Absolute: 0.1 10*3/uL (ref 0.0–0.5)
Eosinophils Relative: 1 %
HCT: 32.4 % — ABNORMAL LOW (ref 36.0–46.0)
Hemoglobin: 10.2 g/dL — ABNORMAL LOW (ref 12.0–15.0)
Immature Granulocytes: 1 %
Lymphocytes Relative: 23 %
Lymphs Abs: 1.4 10*3/uL (ref 0.7–4.0)
MCH: 28.5 pg (ref 26.0–34.0)
MCHC: 31.5 g/dL (ref 30.0–36.0)
MCV: 90.5 fL (ref 80.0–100.0)
Monocytes Absolute: 0.6 10*3/uL (ref 0.1–1.0)
Monocytes Relative: 10 %
Neutro Abs: 4 10*3/uL (ref 1.7–7.7)
Neutrophils Relative %: 64 %
Platelet Count: 311 10*3/uL (ref 150–400)
RBC: 3.58 MIL/uL — ABNORMAL LOW (ref 3.87–5.11)
WBC Count: 6.3 10*3/uL (ref 4.0–10.5)
nRBC: 0 % (ref 0.0–0.2)

## 2019-05-31 MED FILL — XELODA 500 MG TABLET: 500 | 21 days supply | Qty: 56 | Fill #0

## 2019-05-31 NOTE — Telephone Encounter (Signed)
Scheduled appt per 6/11 los. Was not able to leave a voice message. A calendar will be mailed out.

## 2019-05-31 NOTE — Progress Notes (Signed)
  [place] OFFICE PROGRESS NOTE   Diagnosis: Colon cancer  INTERVAL HISTORY:   Kristen Cruz returns as scheduled.  She completed cycle 4 adjuvant Xeloda beginning 05/14/2019.  She denies nausea/vomiting.  No mouth sores.  She has occasional loose stools which she relates to diet.  No hand or foot pain or redness.  She does note the hands and feet are dry.  Objective:  Vital signs in last 24 hours:  Blood pressure (!) 160/79, pulse 92, temperature 99.4 F (37.4 C), temperature source Oral, resp. rate 17, height 5' 2.5" (1.588 m), weight 95 lb 14.4 oz (43.5 kg), SpO2 98 %.    HEENT: No thrush or ulcers. GI: Abdomen soft and nontender.  No hepatomegaly. Vascular: No leg edema. Skin: Palms and soles with mild erythema.  No skin breakdown.   Lab Results:  Lab Results  Component Value Date   WBC 6.3 05/31/2019   HGB 10.2 (L) 05/31/2019   HCT 32.4 (L) 05/31/2019   MCV 90.5 05/31/2019   PLT 311 05/31/2019   NEUTROABS 4.0 05/31/2019    Imaging:  No results found.  Medications: I have reviewed the patient's current medications.  Assessment/Plan: 1. 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 4/24 lymph nodes positive, 1 tumor deposit, MSI-stable, no loss of mismatch repair protein expression ? CT abdomen/pelvis 09/22/2018-no acute finding, 10 mm right hepatic dome hypodensity-suspected cyst, nonobstructing right nephrolithiasis ? CT chest 03/28/2019- perifissural right middle lobe nodule-potentially atelectasis or scarring ? Cycle 1 adjuvant Xeloda 03/12/2019 ? Cycle 2 adjuvant Xeloda 04/02/2019 ? Cycle 3 adjuvant Xeloda 04/23/2019 ? Cycle 4 adjuvant Xeloda 05/14/2019 ? Cycle 5 adjuvant Xeloda 06/04/2019 2. Postoperative ileus-resolved following NG tube decompression 3. Iron deficiency anemia secondary to #1 4. Atrial fibrillation-maintained on apixaban 5. Family history of colon and esophagus cancer 6. 7 mm right middle lobe  nodule on chest CT 03/28/2019-plan for repeat CT at a 3 to 41-monthinterval  Disposition: Ms. SSudermanappears stable.  She has completed 4 cycles of adjuvant Xeloda.  She continues to tolerate the chemotherapy well.  She will proceed with cycle 5 as scheduled beginning 06/04/2019.  We reviewed the CBC and chemistry panel from today, adequate to proceed as above.  She will return for lab and follow-up in 3 weeks.  She will contact the office in the interim with any problems.    LNed CardANP/GNP-BC   05/31/2019  9:55 AM

## 2019-06-18 ENCOUNTER — Other Ambulatory Visit: Payer: Self-pay | Admitting: Oncology

## 2019-06-18 DIAGNOSIS — C182 Malignant neoplasm of ascending colon: Secondary | ICD-10-CM

## 2019-06-21 ENCOUNTER — Inpatient Hospital Stay: Payer: Medicare Other | Attending: Oncology

## 2019-06-21 ENCOUNTER — Other Ambulatory Visit: Payer: Self-pay

## 2019-06-21 ENCOUNTER — Inpatient Hospital Stay (HOSPITAL_BASED_OUTPATIENT_CLINIC_OR_DEPARTMENT_OTHER): Payer: Medicare Other | Admitting: Oncology

## 2019-06-21 VITALS — BP 169/74 | HR 109 | Temp 98.9°F | Resp 18 | Ht 62.5 in | Wt 95.4 lb

## 2019-06-21 DIAGNOSIS — C182 Malignant neoplasm of ascending colon: Secondary | ICD-10-CM

## 2019-06-21 DIAGNOSIS — R197 Diarrhea, unspecified: Secondary | ICD-10-CM | POA: Diagnosis not present

## 2019-06-21 DIAGNOSIS — C772 Secondary and unspecified malignant neoplasm of intra-abdominal lymph nodes: Secondary | ICD-10-CM | POA: Diagnosis not present

## 2019-06-21 DIAGNOSIS — C180 Malignant neoplasm of cecum: Secondary | ICD-10-CM

## 2019-06-21 DIAGNOSIS — R911 Solitary pulmonary nodule: Secondary | ICD-10-CM | POA: Diagnosis not present

## 2019-06-21 DIAGNOSIS — I4891 Unspecified atrial fibrillation: Secondary | ICD-10-CM | POA: Diagnosis not present

## 2019-06-21 DIAGNOSIS — D509 Iron deficiency anemia, unspecified: Secondary | ICD-10-CM | POA: Insufficient documentation

## 2019-06-21 DIAGNOSIS — Z7901 Long term (current) use of anticoagulants: Secondary | ICD-10-CM

## 2019-06-21 DIAGNOSIS — L271 Localized skin eruption due to drugs and medicaments taken internally: Secondary | ICD-10-CM

## 2019-06-21 LAB — CBC WITH DIFFERENTIAL (CANCER CENTER ONLY)
Abs Immature Granulocytes: 0.04 10*3/uL (ref 0.00–0.07)
Basophils Absolute: 0.1 10*3/uL (ref 0.0–0.1)
Basophils Relative: 1 %
Eosinophils Absolute: 0 10*3/uL (ref 0.0–0.5)
Eosinophils Relative: 1 %
HCT: 33.2 % — ABNORMAL LOW (ref 36.0–46.0)
Hemoglobin: 10.8 g/dL — ABNORMAL LOW (ref 12.0–15.0)
Immature Granulocytes: 1 %
Lymphocytes Relative: 28 %
Lymphs Abs: 1.7 10*3/uL (ref 0.7–4.0)
MCH: 30.2 pg (ref 26.0–34.0)
MCHC: 32.5 g/dL (ref 30.0–36.0)
MCV: 92.7 fL (ref 80.0–100.0)
Monocytes Absolute: 0.6 10*3/uL (ref 0.1–1.0)
Monocytes Relative: 9 %
Neutro Abs: 3.7 10*3/uL (ref 1.7–7.7)
Neutrophils Relative %: 60 %
Platelet Count: 344 10*3/uL (ref 150–400)
RBC: 3.58 MIL/uL — ABNORMAL LOW (ref 3.87–5.11)
WBC Count: 6 10*3/uL (ref 4.0–10.5)
nRBC: 0 % (ref 0.0–0.2)

## 2019-06-21 LAB — CMP (CANCER CENTER ONLY)
ALT: 13 U/L (ref 0–44)
AST: 17 U/L (ref 15–41)
Albumin: 4.3 g/dL (ref 3.5–5.0)
Alkaline Phosphatase: 92 U/L (ref 38–126)
Anion gap: 12 (ref 5–15)
BUN: 8 mg/dL (ref 8–23)
CO2: 22 mmol/L (ref 22–32)
Calcium: 8.8 mg/dL — ABNORMAL LOW (ref 8.9–10.3)
Chloride: 106 mmol/L (ref 98–111)
Creatinine: 0.87 mg/dL (ref 0.44–1.00)
GFR, Est AFR Am: >60 mL/min (ref >60)
GFR, Estimated: >60 mL/min (ref >60)
Glucose, Bld: 106 mg/dL — ABNORMAL HIGH (ref 70–99)
Potassium: 3.7 mmol/L (ref 3.5–5.1)
Sodium: 140 mmol/L (ref 135–145)
Total Bilirubin: 0.4 mg/dL (ref 0.3–1.2)
Total Protein: 7.3 g/dL (ref 6.5–8.1)

## 2019-06-21 MED FILL — XELODA 500 MG TABLET: 500 | 21 days supply | Qty: 56 | Fill #0

## 2019-06-21 NOTE — Patient Instructions (Signed)
Begin your Xeloda cycle on 06/25/2019

## 2019-06-21 NOTE — Progress Notes (Signed)
  [place] OFFICE PROGRESS NOTE   Diagnosis: Colon cancer  INTERVAL HISTORY:   Kristen Cruz completed another cycle of Xeloda beginning 06/04/2019.  No mouth sores.  She had discomfort at the sole of the left foot, this has resolved.  She has noted dryness of the hands and feet.  She has soft bowel movements after eating.  Occasional diarrhea.  She does not have diarrhea daily.  Objective:  Vital signs in last 24 hours:  Blood pressure (!) 169/74, pulse (!) 109, temperature 98.9 F (37.2 C), temperature source Oral, resp. rate 18, height 5' 2.5" (1.588 m), weight 95 lb 6.4 oz (43.3 kg), SpO2 96 %.    Limited physical examination secondary to distancing with the COVID pandemic GI: Nontender, no hepatomegaly Vascular: No leg edema  Skin: Mild erythema and dryness of the palms and soles, mild superficial desquamation at soles    Lab Results:  Lab Results  Component Value Date   WBC 6.0 06/21/2019   HGB 10.8 (L) 06/21/2019   HCT 33.2 (L) 06/21/2019   MCV 92.7 06/21/2019   PLT 344 06/21/2019   NEUTROABS 3.7 06/21/2019    CMP  Lab Results  Component Value Date   NA 139 05/31/2019   K 4.8 05/31/2019   CL 107 05/31/2019   CO2 23 05/31/2019   GLUCOSE 102 (H) 05/31/2019   BUN 12 05/31/2019   CREATININE 0.84 05/31/2019   CALCIUM 9.0 05/31/2019   PROT 7.3 05/31/2019   ALBUMIN 4.4 05/31/2019   AST 20 05/31/2019   ALT 18 05/31/2019   ALKPHOS 88 05/31/2019   BILITOT 0.4 05/31/2019   GFRNONAA >60 05/31/2019   GFRAA >60 05/31/2019    Medications: I have reviewed the patient's current medications.   Assessment/Plan: sessment/Plan: 1. 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 4/24 lymph nodes positive, 1 tumor deposit, MSI-stable, no loss of mismatch repair protein expression ? CT abdomen/pelvis 09/22/2018-no acute finding, 10 mm right hepatic dome hypodensity-suspected cyst, nonobstructing right nephrolithiasis ? CT  chest 03/28/2019- perifissural right middle lobe nodule-potentially atelectasis or scarring ? Cycle 1 adjuvant Xeloda 03/12/2019 ? Cycle 2 adjuvant Xeloda 04/02/2019 ? Cycle 3 adjuvant Xeloda 04/23/2019 ? Cycle 4 adjuvant Xeloda 05/14/2019 ? Cycle 5 adjuvant Xeloda 06/04/2019 ? Cycle 6 adjuvant Xeloda 06/25/2019 2. Postoperative ileus-resolved following NG tube decompression 3. Iron deficiency anemia secondary to #1 4. Atrial fibrillation-maintained on apixaban 5. Family history of colon and esophagus cancer 6. 7 mm right middle lobe nodule on chest CT 03/28/2019-plan for repeat CT at a 3 to 25-monthinterval  Disposition: Kristen Cruz unchanged.  She is tolerating Xeloda well.  She has mild hand/foot syndrome.  She will [place] or new symptoms.  She will begin another cycle of Xeloda on 06/25/2019.  Ms. SHinostrozawill return for an office visit in 3 weeks.  GBetsy Coder MD  06/21/2019  8:19 AM

## 2019-06-21 NOTE — Progress Notes (Signed)
Provided patient handout on how to sign up for Mychart. She will have her daughter assist.

## 2019-06-25 ENCOUNTER — Telehealth: Payer: Self-pay | Admitting: Oncology

## 2019-06-25 NOTE — Telephone Encounter (Signed)
Called and left for patient

## 2019-07-09 ENCOUNTER — Other Ambulatory Visit: Payer: Self-pay | Admitting: Oncology

## 2019-07-09 DIAGNOSIS — C182 Malignant neoplasm of ascending colon: Secondary | ICD-10-CM

## 2019-07-12 ENCOUNTER — Telehealth: Payer: Self-pay | Admitting: Nurse Practitioner

## 2019-07-12 ENCOUNTER — Inpatient Hospital Stay: Payer: Medicare Other

## 2019-07-12 ENCOUNTER — Inpatient Hospital Stay (HOSPITAL_BASED_OUTPATIENT_CLINIC_OR_DEPARTMENT_OTHER): Payer: Medicare Other | Admitting: Nurse Practitioner

## 2019-07-12 ENCOUNTER — Other Ambulatory Visit: Payer: Self-pay

## 2019-07-12 ENCOUNTER — Encounter: Payer: Self-pay | Admitting: Nurse Practitioner

## 2019-07-12 VITALS — BP 161/77 | HR 95 | Temp 98.2°F | Resp 18 | Wt 95.4 lb

## 2019-07-12 DIAGNOSIS — C772 Secondary and unspecified malignant neoplasm of intra-abdominal lymph nodes: Secondary | ICD-10-CM | POA: Diagnosis not present

## 2019-07-12 DIAGNOSIS — D509 Iron deficiency anemia, unspecified: Secondary | ICD-10-CM

## 2019-07-12 DIAGNOSIS — C180 Malignant neoplasm of cecum: Secondary | ICD-10-CM | POA: Diagnosis not present

## 2019-07-12 DIAGNOSIS — I4891 Unspecified atrial fibrillation: Secondary | ICD-10-CM

## 2019-07-12 DIAGNOSIS — C182 Malignant neoplasm of ascending colon: Secondary | ICD-10-CM

## 2019-07-12 DIAGNOSIS — R911 Solitary pulmonary nodule: Secondary | ICD-10-CM

## 2019-07-12 DIAGNOSIS — L271 Localized skin eruption due to drugs and medicaments taken internally: Secondary | ICD-10-CM

## 2019-07-12 DIAGNOSIS — Z800 Family history of malignant neoplasm of digestive organs: Secondary | ICD-10-CM

## 2019-07-12 DIAGNOSIS — R197 Diarrhea, unspecified: Secondary | ICD-10-CM

## 2019-07-12 LAB — CMP (CANCER CENTER ONLY)
ALT: 12 U/L (ref 0–44)
AST: 18 U/L (ref 15–41)
Albumin: 4.4 g/dL (ref 3.5–5.0)
Alkaline Phosphatase: 96 U/L (ref 38–126)
Anion gap: 10 (ref 5–15)
BUN: 9 mg/dL (ref 8–23)
CO2: 25 mmol/L (ref 22–32)
Calcium: 9.2 mg/dL (ref 8.9–10.3)
Chloride: 106 mmol/L (ref 98–111)
Creatinine: 0.84 mg/dL (ref 0.44–1.00)
GFR, Est AFR Am: >60 mL/min (ref >60)
GFR, Estimated: >60 mL/min (ref >60)
Glucose, Bld: 105 mg/dL — ABNORMAL HIGH (ref 70–99)
Potassium: 4 mmol/L (ref 3.5–5.1)
Sodium: 141 mmol/L (ref 135–145)
Total Bilirubin: 0.4 mg/dL (ref 0.3–1.2)
Total Protein: 7.4 g/dL (ref 6.5–8.1)

## 2019-07-12 LAB — CBC WITH DIFFERENTIAL (CANCER CENTER ONLY)
Abs Immature Granulocytes: 0.02 10*3/uL (ref 0.00–0.07)
Basophils Absolute: 0 10*3/uL (ref 0.0–0.1)
Basophils Relative: 1 %
Eosinophils Absolute: 0 10*3/uL (ref 0.0–0.5)
Eosinophils Relative: 1 %
HCT: 32.2 % — ABNORMAL LOW (ref 36.0–46.0)
Hemoglobin: 10.7 g/dL — ABNORMAL LOW (ref 12.0–15.0)
Immature Granulocytes: 0 %
Lymphocytes Relative: 25 %
Lymphs Abs: 1.5 10*3/uL (ref 0.7–4.0)
MCH: 31.8 pg (ref 26.0–34.0)
MCHC: 33.2 g/dL (ref 30.0–36.0)
MCV: 95.5 fL (ref 80.0–100.0)
Monocytes Absolute: 0.6 10*3/uL (ref 0.1–1.0)
Monocytes Relative: 10 %
Neutro Abs: 3.8 10*3/uL (ref 1.7–7.7)
Neutrophils Relative %: 63 %
Platelet Count: 260 10*3/uL (ref 150–400)
RBC: 3.37 MIL/uL — ABNORMAL LOW (ref 3.87–5.11)
RDW: 26.7 % — ABNORMAL HIGH (ref 11.5–15.5)
WBC Count: 6 10*3/uL (ref 4.0–10.5)
nRBC: 0 % (ref 0.0–0.2)

## 2019-07-12 MED FILL — XELODA 500 MG TABLET: 500 | 21 days supply | Qty: 56 | Fill #0

## 2019-07-12 NOTE — Progress Notes (Signed)
  [place] OFFICE PROGRESS NOTE   Diagnosis: Colon cancer  INTERVAL HISTORY:   Kristen Cruz returns as scheduled.  She completed cycle 6 Xeloda beginning 06/25/2019.  She has periodic mild nausea.  She notes that her lips are dry.  She had a single "sore" on the upper lip which has resolved.  No sores within the oral cavity.  She has occasional diarrhea.  She notes hands and feet are red.  She sometimes has pain over the soles when walking barefoot.  She reports her sister was recently diagnosed with colon cancer.  Objective:  Vital signs in last 24 hours:  Blood pressure (!) 161/77, pulse 95, temperature 98.2 F (36.8 C), temperature source Oral, resp. rate 18, weight 95 lb 6.4 oz (43.3 kg), SpO2 100 %.    HEENT: No thrush or ulcers.  Mucous membranes appear moist. GI: Abdomen soft and nontender.  No hepatomegaly. Vascular: No leg edema. Skin: Palms and soles with mild erythema.  A few areas of mild desquamation at the soles.   Lab Results:  Lab Results  Component Value Date   WBC 6.0 07/12/2019   HGB 10.7 (L) 07/12/2019   HCT 32.2 (L) 07/12/2019   MCV 95.5 07/12/2019   PLT 260 07/12/2019   NEUTROABS 3.8 07/12/2019    Imaging:  No results found.  Medications: I have reviewed the patient's current medications.  Assessment/Plan: 1. 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 4/24 lymph nodes positive, 1 tumor deposit, MSI-stable, no loss of mismatch repair protein expression ? CT abdomen/pelvis 09/22/2018-no acute finding, 10 mm right hepatic dome hypodensity-suspected cyst, nonobstructing right nephrolithiasis ? CT chest 03/28/2019-perifissural right middle lobe nodule-potentially atelectasis or scarring ? Cycle 1 adjuvant Xeloda 03/12/2019 ? Cycle 2 adjuvant Xeloda 04/02/2019 ? Cycle 3 adjuvant Xeloda 04/23/2019 ? Cycle 4 adjuvant Xeloda 05/14/2019 ? Cycle 5 adjuvant Xeloda 06/04/2019 ? Cycle 6 adjuvant Xeloda 06/25/2019  ? Cycle 7 adjuvant Xeloda 07/16/2019 2. Postoperative ileus-resolved following NG tube decompression 3. Iron deficiency anemia secondary to #1 4. Atrial fibrillation-maintained on apixaban 5. Family history of colon and esophagus cancer 6. 7 mm right middle lobe nodule on chest CT 03/28/2019-plan for repeat CT at a 3 to 69-monthinterval  Disposition: Ms. SShinaultappears stable.  She has completed 6 cycles of adjuvant Xeloda.  Plan to proceed with cycle 7 as scheduled beginning 07/16/2019.  She has mild changes of hand-foot syndrome.  She understands to contact the office if symptoms worsen.  We discussed her family history which is significant for cancer including colon and esophagus.  I discussed a referral to the genetics counselor with her.  She declines this at present.  She will return for lab and follow-up in 3 weeks.  She will contact the office in the interim with any problems.    LNed CardANP/GNP-BC   07/12/2019  11:13 AM

## 2019-07-12 NOTE — Telephone Encounter (Signed)
Scheduled per los. Patient declined printout  

## 2019-07-19 ENCOUNTER — Other Ambulatory Visit: Payer: Self-pay

## 2019-07-25 ENCOUNTER — Other Ambulatory Visit: Payer: Self-pay

## 2019-07-25 ENCOUNTER — Inpatient Hospital Stay: Payer: Medicare Other | Attending: Oncology

## 2019-07-25 ENCOUNTER — Telehealth: Payer: Self-pay | Admitting: *Deleted

## 2019-07-25 ENCOUNTER — Other Ambulatory Visit: Payer: Self-pay | Admitting: Nurse Practitioner

## 2019-07-25 DIAGNOSIS — R079 Chest pain, unspecified: Secondary | ICD-10-CM | POA: Diagnosis not present

## 2019-07-25 DIAGNOSIS — R300 Dysuria: Secondary | ICD-10-CM

## 2019-07-25 DIAGNOSIS — R911 Solitary pulmonary nodule: Secondary | ICD-10-CM | POA: Diagnosis not present

## 2019-07-25 DIAGNOSIS — D509 Iron deficiency anemia, unspecified: Secondary | ICD-10-CM | POA: Diagnosis not present

## 2019-07-25 DIAGNOSIS — C182 Malignant neoplasm of ascending colon: Secondary | ICD-10-CM

## 2019-07-25 DIAGNOSIS — L271 Localized skin eruption due to drugs and medicaments taken internally: Secondary | ICD-10-CM | POA: Diagnosis not present

## 2019-07-25 DIAGNOSIS — Z79899 Other long term (current) drug therapy: Secondary | ICD-10-CM | POA: Insufficient documentation

## 2019-07-25 DIAGNOSIS — I4891 Unspecified atrial fibrillation: Secondary | ICD-10-CM | POA: Diagnosis not present

## 2019-07-25 DIAGNOSIS — C180 Malignant neoplasm of cecum: Secondary | ICD-10-CM | POA: Diagnosis not present

## 2019-07-25 LAB — URINALYSIS, COMPLETE (UACMP) WITH MICROSCOPIC
Bilirubin Urine: NEGATIVE
Glucose, UA: NEGATIVE mg/dL
Ketones, ur: NEGATIVE mg/dL
Nitrite: POSITIVE — AB
Protein, ur: NEGATIVE mg/dL
Specific Gravity, Urine: 1.003 — ABNORMAL LOW (ref 1.005–1.030)
WBC, UA: >50 WBC/hpf — ABNORMAL HIGH (ref 0–5)
pH: 5 (ref 5.0–8.0)

## 2019-07-25 MED ORDER — CIPROFLOXACIN HCL 500 MG PO TABS: 500.0000 mg | ORAL_TABLET | Freq: Two times a day (BID) | ORAL | 0 refills | Status: AC

## 2019-07-25 NOTE — Telephone Encounter (Addendum)
Called to report intermittent dysuria since Friday. Last night and today it is worse and is persistent. She will come in today at 2:30 pm for UA/culture. Notified patient of U/A results and that culture will be sent and she will be called if it requires a change in therapy. NP sent in script for Cipro 500 mg bid. Encouraged her to push po fluids.

## 2019-07-27 LAB — URINE CULTURE: Culture: >=100000 — AB

## 2019-08-02 ENCOUNTER — Encounter: Payer: Self-pay | Admitting: Nurse Practitioner

## 2019-08-02 ENCOUNTER — Other Ambulatory Visit: Payer: Self-pay

## 2019-08-02 ENCOUNTER — Telehealth: Payer: Self-pay | Admitting: Nurse Practitioner

## 2019-08-02 ENCOUNTER — Inpatient Hospital Stay: Payer: Medicare Other

## 2019-08-02 ENCOUNTER — Inpatient Hospital Stay (HOSPITAL_BASED_OUTPATIENT_CLINIC_OR_DEPARTMENT_OTHER): Payer: Medicare Other | Admitting: Nurse Practitioner

## 2019-08-02 VITALS — BP 164/86 | HR 101 | Temp 98.9°F | Resp 16 | Wt 96.4 lb

## 2019-08-02 DIAGNOSIS — C182 Malignant neoplasm of ascending colon: Secondary | ICD-10-CM | POA: Diagnosis not present

## 2019-08-02 DIAGNOSIS — C180 Malignant neoplasm of cecum: Secondary | ICD-10-CM | POA: Diagnosis not present

## 2019-08-02 LAB — CMP (CANCER CENTER ONLY)
ALT: 10 U/L (ref 0–44)
AST: 17 U/L (ref 15–41)
Albumin: 4.1 g/dL (ref 3.5–5.0)
Alkaline Phosphatase: 96 U/L (ref 38–126)
Anion gap: 8 (ref 5–15)
BUN: 10 mg/dL (ref 8–23)
CO2: 25 mmol/L (ref 22–32)
Calcium: 9 mg/dL (ref 8.9–10.3)
Chloride: 106 mmol/L (ref 98–111)
Creatinine: 0.82 mg/dL (ref 0.44–1.00)
GFR, Est AFR Am: >60 mL/min (ref >60)
GFR, Estimated: >60 mL/min (ref >60)
Glucose, Bld: 100 mg/dL — ABNORMAL HIGH (ref 70–99)
Potassium: 3.9 mmol/L (ref 3.5–5.1)
Sodium: 139 mmol/L (ref 135–145)
Total Bilirubin: 0.3 mg/dL (ref 0.3–1.2)
Total Protein: 6.9 g/dL (ref 6.5–8.1)

## 2019-08-02 LAB — CBC WITH DIFFERENTIAL (CANCER CENTER ONLY)
Abs Immature Granulocytes: 0.14 10*3/uL — ABNORMAL HIGH (ref 0.00–0.07)
Basophils Absolute: 0 10*3/uL (ref 0.0–0.1)
Basophils Relative: 1 %
Eosinophils Absolute: 0 10*3/uL (ref 0.0–0.5)
Eosinophils Relative: 1 %
HCT: 33.2 % — ABNORMAL LOW (ref 36.0–46.0)
Hemoglobin: 10.9 g/dL — ABNORMAL LOW (ref 12.0–15.0)
Immature Granulocytes: 2 %
Lymphocytes Relative: 24 %
Lymphs Abs: 1.4 10*3/uL (ref 0.7–4.0)
MCH: 31.8 pg (ref 26.0–34.0)
MCHC: 32.8 g/dL (ref 30.0–36.0)
MCV: 96.8 fL (ref 80.0–100.0)
Monocytes Absolute: 0.6 10*3/uL (ref 0.1–1.0)
Monocytes Relative: 9 %
Neutro Abs: 3.8 10*3/uL (ref 1.7–7.7)
Neutrophils Relative %: 63 %
Platelet Count: 306 10*3/uL (ref 150–400)
RBC: 3.43 MIL/uL — ABNORMAL LOW (ref 3.87–5.11)
RDW: 24.5 % — ABNORMAL HIGH (ref 11.5–15.5)
WBC Count: 6 10*3/uL (ref 4.0–10.5)
nRBC: 0 % (ref 0.0–0.2)

## 2019-08-02 MED ORDER — CAPECITABINE 500 MG PO TABS: ORAL_TABLET | ORAL | 0 refills | Status: DC

## 2019-08-02 MED FILL — CAPECITABINE 500 MG TABS: 500 | 21 days supply | Qty: 56 | Fill #0

## 2019-08-02 NOTE — Progress Notes (Signed)
  [place] OFFICE PROGRESS NOTE   Diagnosis: Colon cancer  INTERVAL HISTORY:   Kristen Cruz returns as scheduled.  She completed cycle 7 adjuvant Xeloda beginning 07/16/2019.  She denies nausea/vomiting.  No mouth sores.  Mouth feels "dry" at times.  No significant diarrhea.  When she does have a loose stool it tends to be diet related.  She has intermittent pain in the hands and feet.  She has a good appetite.  No fever, cough, shortness of breath.  She was treated for urinary tract infection last week.  Dysuria has resolved.  She has noted an intermittent "burning" sensation across the upper chest periodically over the last 6 months.  No associated symptoms.  She thinks the burning may be related to "nerves" or her daughter thinks possibly reflux.  The sensation is not activity/exertion related.  Objective:  Vital signs in last 24 hours:  Blood pressure (!) 164/86, pulse (!) 101, temperature 98.9 F (37.2 C), resp. rate 16, weight 96 lb 6.4 oz (43.7 kg), SpO2 100 %.    HEENT: No thrush or ulcers. GI: Abdomen soft and nontender.  No hepatomegaly. Vascular: No leg edema. Neuro: Alert and oriented. Skin: Palms and soles with mild erythema, dryness over the heels.  No rash over the chest.   Lab Results:  Lab Results  Component Value Date   WBC 6.0 08/02/2019   HGB 10.9 (L) 08/02/2019   HCT 33.2 (L) 08/02/2019   MCV 96.8 08/02/2019   PLT 306 08/02/2019   NEUTROABS 3.8 08/02/2019    Imaging:  No results found.  Medications: I have reviewed the patient's current medications.  Assessment/Plan: 1. 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 4/24 lymph nodes positive, 1 tumor deposit, MSI-stable, no loss of mismatch repair protein expression ? CT abdomen/pelvis 09/22/2018-no acute finding, 10 mm right hepatic dome hypodensity-suspected cyst, nonobstructing right nephrolithiasis ? CT chest 03/28/2019-perifissural right middle  lobe nodule-potentially atelectasis or scarring ? Cycle 1 adjuvant Xeloda 03/12/2019 ? Cycle 2 adjuvant Xeloda 04/02/2019 ? Cycle 3 adjuvant Xeloda 04/23/2019 ? Cycle 4 adjuvant Xeloda 05/14/2019 ? Cycle 5 adjuvant Xeloda 06/04/2019 ? Cycle 6 adjuvant Xeloda 06/25/2019 ? Cycle 7 adjuvant Xeloda 07/16/2019 ? Cycle 8 adjuvant Xeloda 08/02/2019 2. Postoperative ileus-resolved following NG tube decompression 3. Iron deficiency anemia secondary to #1 4. Atrial fibrillation-maintained on apixaban 5. Family history of colon and esophagus cancer 6. 7 mm right middle lobe nodule on chest CT 03/28/2019-plan for repeat CT at a 3 to 46-monthinterval  Disposition: Ms. SFischbachappears stable.  She has completed 7 cycles of adjuvant Xeloda.  Plan to proceed with the eighth and final cycle beginning 08/02/2019.  She has stable mild changes of hand-foot syndrome.  She will contact the office if symptoms worsen.  She will try Maalox/Mylanta for possible reflux.  She will contact [place] is not effective.  She will undergo a chest CT in about 3 weeks to follow-up the 7 mm right middle lobe nodule noted on scan 03/28/2019.  She will return for lab and follow-up in 1 month.  She will contact the office in the interim as outlined above or with any other problems.  Plan reviewed with Dr. SBenay Spice    LNed CardANP/GNP-BC   08/02/2019  10:33 AM

## 2019-08-02 NOTE — Telephone Encounter (Signed)
Scheduled appt per 8/13 los - gave patient AVS and calender per los.  

## 2019-08-13 IMAGING — CT CT CHEST WITHOUT CONTRAST
2 of 4 series · 15 of 36 positions shown, 18 images · non-contrast
Comparison: Abdomen/pelvis CT 02/14/2019

CLINICAL DATA: Cecal adenocarcinoma.

EXAM:
CT CHEST WITHOUT CONTRAST
TECHNIQUE: Multidetector CT imaging of the chest was performed following the
standard protocol without IV contrast.

[Series 2: thorax · axial · 0.62mm/px · z∈[-245,+1]mm · 12 of 145 slices shown, 15 images]
[im 11/145  mediastinal]
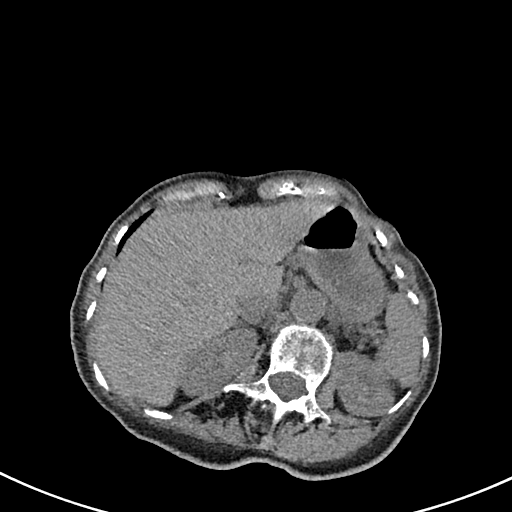
[im 11/145  lung]
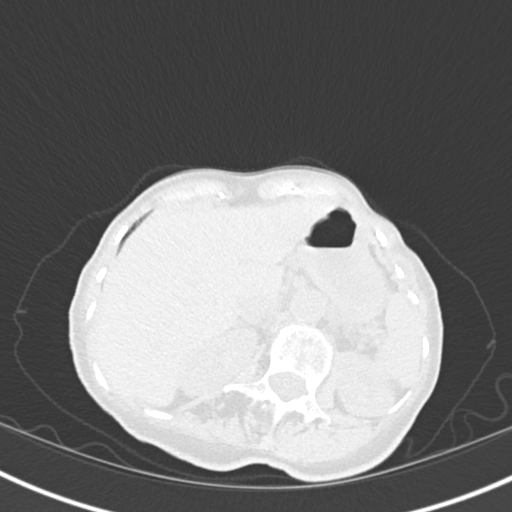
[im 21/145  lung]
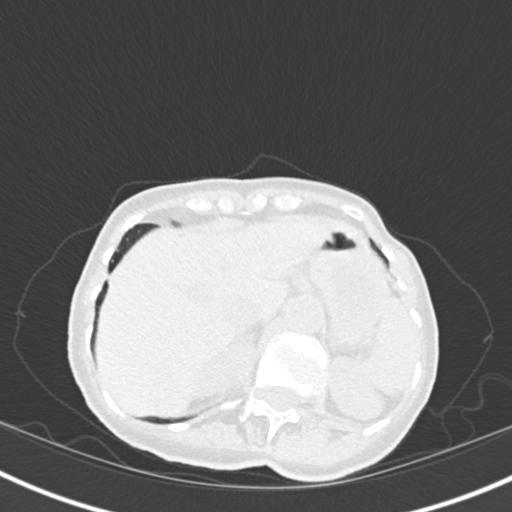
[im 31/145  lung]
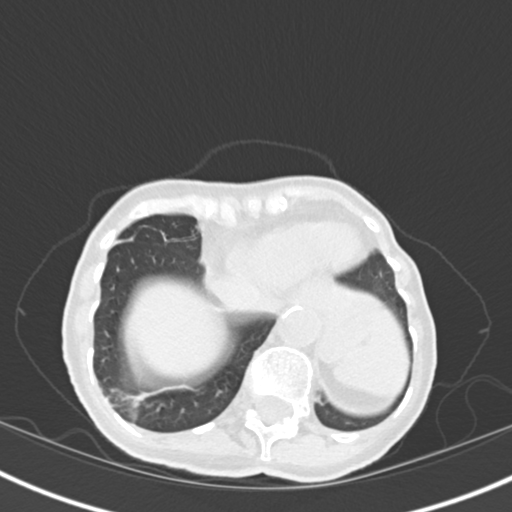
[im 42/145  lung]
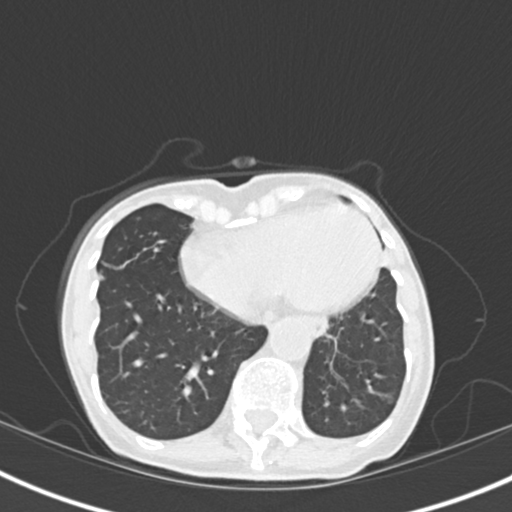
[im 52/145  mediastinal]
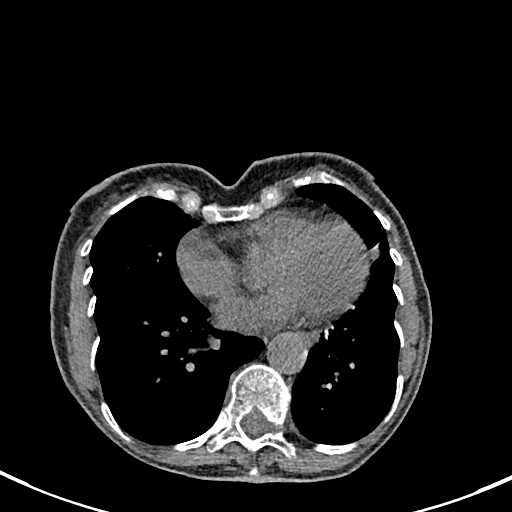
[im 52/145  lung]
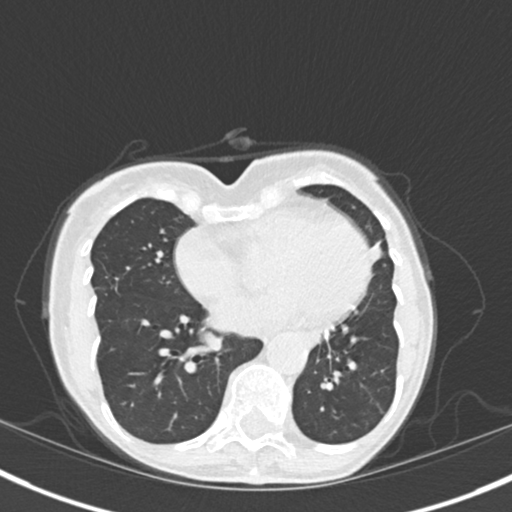
[im 62/145  lung]
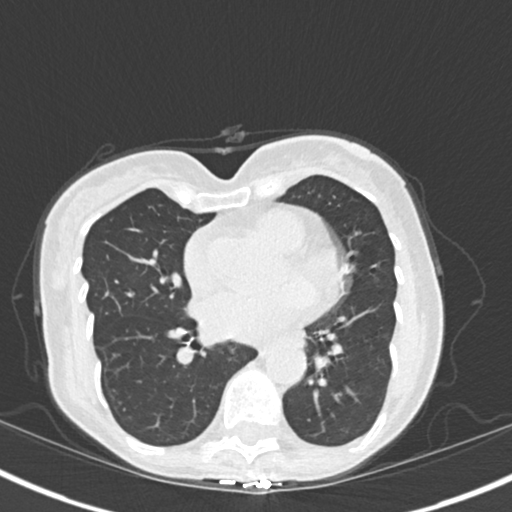
[im 83/145  lung]
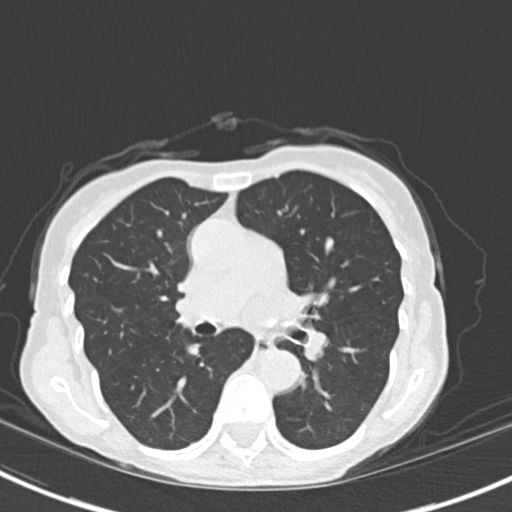
[im 93/145  lung]
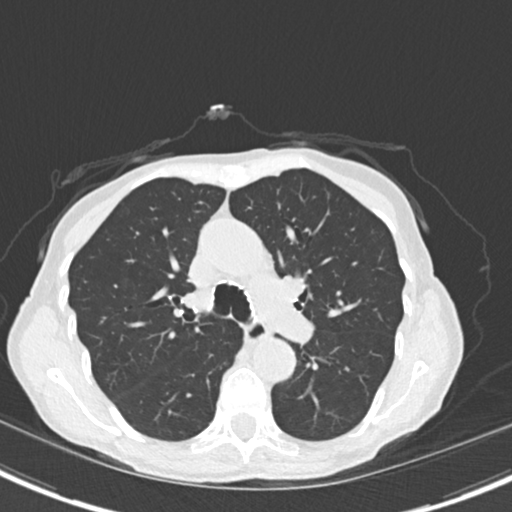
[im 103/145  mediastinal]
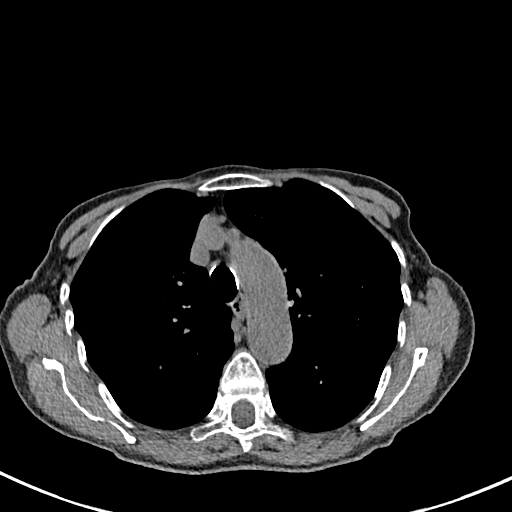
[im 103/145  lung]
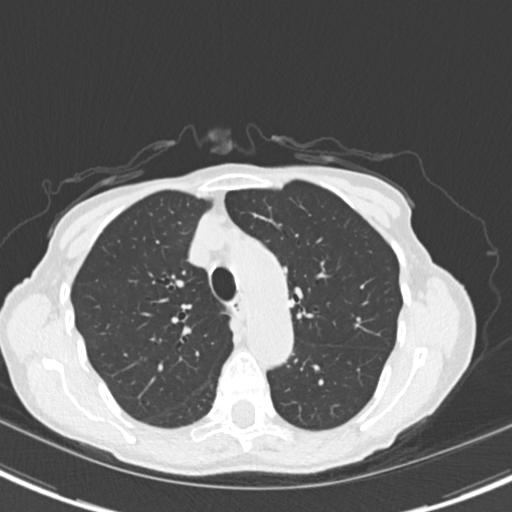
[im 114/145  lung]
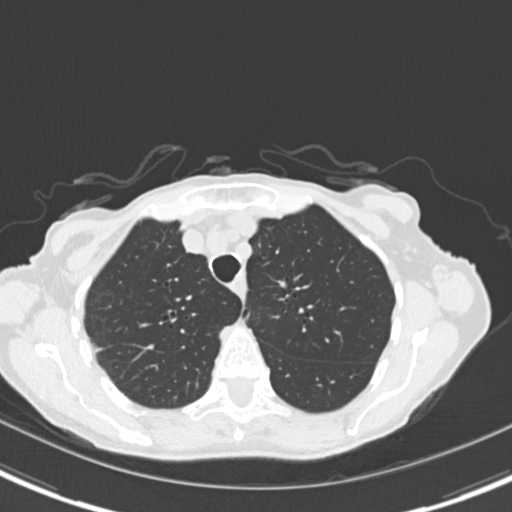
[im 124/145  lung]
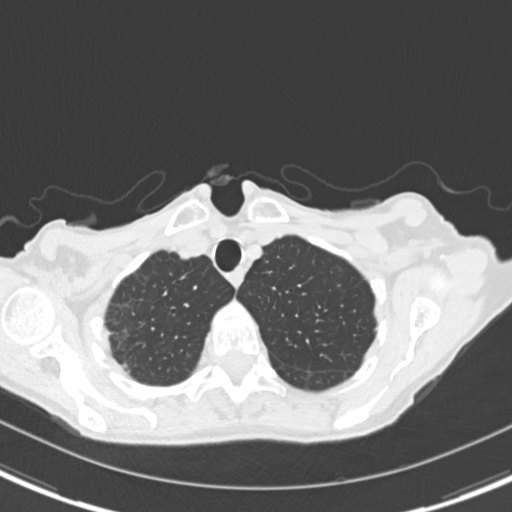
[im 134/145  lung]
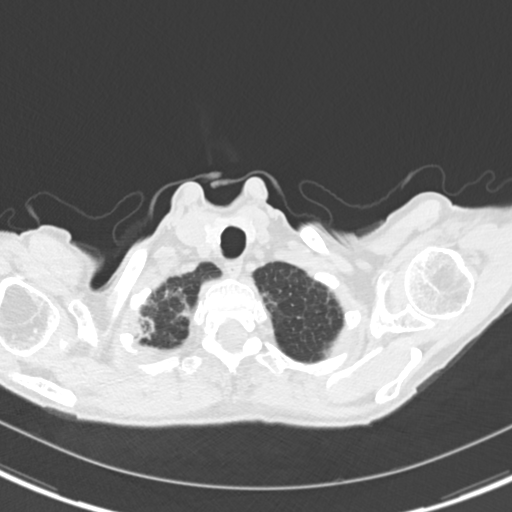

[Series 5: coronal · coronal · 0.66mm/px · 3 of 102 slices shown]
[im 21/102  lung]
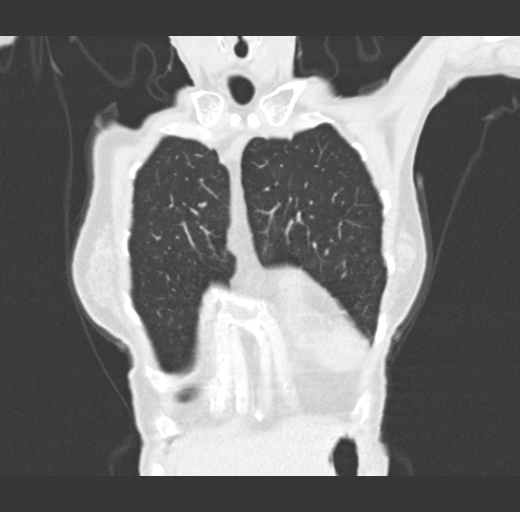
[im 41/102  lung]
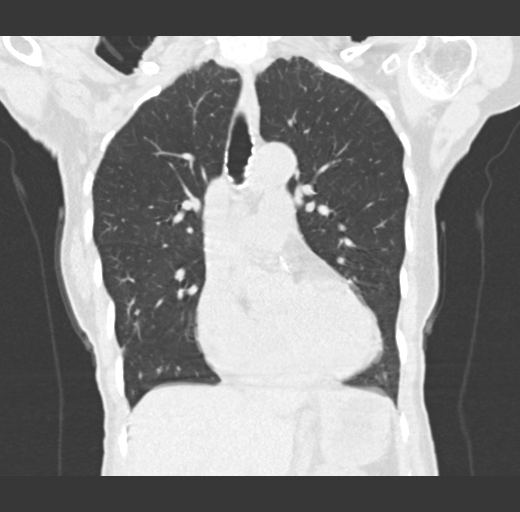
[im 61/102  lung]
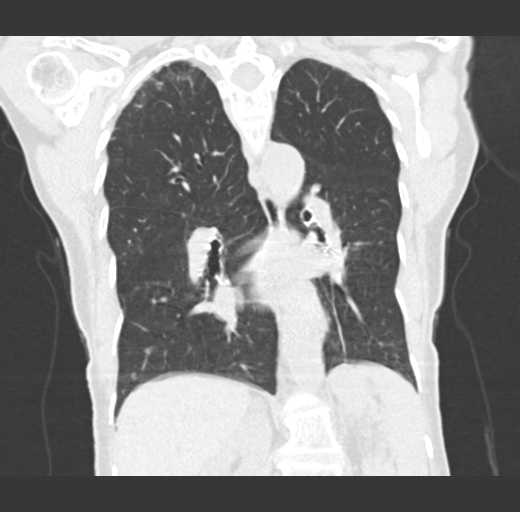

[15 of 36 positions shown; findings below may reference images not displayed]

FINDINGS: Cardiovascular: Heart size upper normal. Coronary artery
calcification is evident. Atherosclerotic calcification is noted in
the wall of the thoracic aorta.

Mediastinum/Nodes: No mediastinal lymphadenopathy. No evidence for
gross hilar lymphadenopathy although assessment is limited by the
lack of intravenous contrast on today's study. There is no axillary
lymphadenopathy. The esophagus has normal imaging features.

Lungs/Pleura: The central tracheobronchial airways are patent.
Biapical pleuroparenchymal scarring evident. 7 x 7 mm right middle
lobe perifissural nodule has a relatively flat (platelike)
appearance on sagittal imaging. No overtly suspicious nodule or mass
in either lung. Scattered areas of subsegmental atelectasis and
small airway impaction evident bilaterally. No pleural effusion.

Upper Abdomen: Unremarkable.

Musculoskeletal: No worrisome lytic or sclerotic osseous
abnormality. Small sclerotic lesion inferior right glenoid is likely
a bone island.
IMPRESSION: 1. 7 x 7 mm perifissural right middle lobe pulmonary nodule has a
relatively flat appearance on sagittal imaging. This may be
atelectasis or scarring. Consider follow-up CT chest in 3 months to
ensure stability.
2. No definite findings to suggest metastatic disease in the thorax.
3. Small sclerotic lesion inferior right glenoid is probably a bone
island. This could be reassessed at the time of follow-up CT.
4.  Aortic Atherosclerois (KEIWC-170.0)
1.

## 2019-08-14 ENCOUNTER — Other Ambulatory Visit: Payer: Self-pay | Admitting: Cardiology

## 2019-08-14 NOTE — Telephone Encounter (Signed)
89f 43.7kg Scr 0.82 08/02/19 Lovw/hammond 02/22/19

## 2019-08-23 ENCOUNTER — Ambulatory Visit (HOSPITAL_COMMUNITY)
Admission: RE | Admit: 2019-08-23 | Discharge: 2019-08-23 | Disposition: A | Discharge status: Home / Self Care | Payer: Medicare Other | Source: Ambulatory Visit | Attending: Nurse Practitioner | Admitting: Nurse Practitioner

## 2019-08-23 ENCOUNTER — Other Ambulatory Visit: Payer: Self-pay

## 2019-08-23 DIAGNOSIS — C182 Malignant neoplasm of ascending colon: Secondary | ICD-10-CM | POA: Diagnosis present

## 2019-08-30 ENCOUNTER — Other Ambulatory Visit: Payer: Self-pay

## 2019-08-30 ENCOUNTER — Inpatient Hospital Stay: Payer: Medicare Other | Attending: Oncology | Admitting: Oncology

## 2019-08-30 ENCOUNTER — Inpatient Hospital Stay: Payer: Medicare Other

## 2019-08-30 ENCOUNTER — Telehealth: Payer: Self-pay | Admitting: Oncology

## 2019-08-30 VITALS — BP 165/79 | HR 96 | Temp 98.9°F | Resp 18 | Ht 62.5 in | Wt 98.2 lb

## 2019-08-30 DIAGNOSIS — C182 Malignant neoplasm of ascending colon: Secondary | ICD-10-CM | POA: Diagnosis not present

## 2019-08-30 DIAGNOSIS — I4891 Unspecified atrial fibrillation: Secondary | ICD-10-CM | POA: Insufficient documentation

## 2019-08-30 DIAGNOSIS — L271 Localized skin eruption due to drugs and medicaments taken internally: Secondary | ICD-10-CM | POA: Diagnosis not present

## 2019-08-30 DIAGNOSIS — C180 Malignant neoplasm of cecum: Secondary | ICD-10-CM | POA: Insufficient documentation

## 2019-08-30 DIAGNOSIS — R911 Solitary pulmonary nodule: Secondary | ICD-10-CM | POA: Insufficient documentation

## 2019-08-30 DIAGNOSIS — Z7901 Long term (current) use of anticoagulants: Secondary | ICD-10-CM | POA: Diagnosis not present

## 2019-08-30 LAB — CBC WITH DIFFERENTIAL (CANCER CENTER ONLY)
Abs Immature Granulocytes: 0.02 10*3/uL (ref 0.00–0.07)
Basophils Absolute: 0 10*3/uL (ref 0.0–0.1)
Basophils Relative: 1 %
Eosinophils Absolute: 0 10*3/uL (ref 0.0–0.5)
Eosinophils Relative: 0 %
HCT: 34.8 % — ABNORMAL LOW (ref 36.0–46.0)
Hemoglobin: 11.5 g/dL — ABNORMAL LOW (ref 12.0–15.0)
Immature Granulocytes: 0 %
Lymphocytes Relative: 25 %
Lymphs Abs: 1.2 10*3/uL (ref 0.7–4.0)
MCH: 32.2 pg (ref 26.0–34.0)
MCHC: 33 g/dL (ref 30.0–36.0)
MCV: 97.5 fL (ref 80.0–100.0)
Monocytes Absolute: 0.5 10*3/uL (ref 0.1–1.0)
Monocytes Relative: 11 %
Neutro Abs: 3 10*3/uL (ref 1.7–7.7)
Neutrophils Relative %: 63 %
Platelet Count: 280 10*3/uL (ref 150–400)
RBC: 3.57 MIL/uL — ABNORMAL LOW (ref 3.87–5.11)
RDW: 21.6 % — ABNORMAL HIGH (ref 11.5–15.5)
WBC Count: 4.8 10*3/uL (ref 4.0–10.5)
nRBC: 0 % (ref 0.0–0.2)

## 2019-08-30 LAB — CMP (CANCER CENTER ONLY)
ALT: 21 U/L (ref 0–44)
AST: 26 U/L (ref 15–41)
Albumin: 4.4 g/dL (ref 3.5–5.0)
Alkaline Phosphatase: 96 U/L (ref 38–126)
Anion gap: 10 (ref 5–15)
BUN: 8 mg/dL (ref 8–23)
CO2: 26 mmol/L (ref 22–32)
Calcium: 9 mg/dL (ref 8.9–10.3)
Chloride: 105 mmol/L (ref 98–111)
Creatinine: 0.84 mg/dL (ref 0.44–1.00)
GFR, Est AFR Am: >60 mL/min (ref >60)
GFR, Estimated: >60 mL/min (ref >60)
Glucose, Bld: 98 mg/dL (ref 70–99)
Potassium: 3.8 mmol/L (ref 3.5–5.1)
Sodium: 141 mmol/L (ref 135–145)
Total Bilirubin: 0.7 mg/dL (ref 0.3–1.2)
Total Protein: 7 g/dL (ref 6.5–8.1)

## 2019-08-30 LAB — CEA (IN HOUSE-CHCC): CEA (CHCC-In House): 1.8 ng/mL (ref 0.00–5.00)

## 2019-08-30 NOTE — Progress Notes (Signed)
  [place] OFFICE PROGRESS NOTE   Diagnosis: Colon cancer  INTERVAL HISTORY:   Kristen Cruz completed another cycle of Xeloda beginning 08/02/2019.  She reports erythema and dryness of the palms and soles.  She has dry eyes.  Objective:  Vital signs in last 24 hours:  Blood pressure (!) 165/79, pulse 96, temperature 98.9 F (37.2 C), temperature source Oral, resp. rate 18, height 5' 2.5" (1.588 m), weight 98 lb 3.2 oz (44.5 kg), SpO2 98 %.   Lymphatics: No cervical, supraclavicular, axillary, or inguinal nodes GI: No hepatomegaly, no mass, nontender Vascular: No leg edema  Skin: Erythema and skin thickening at the palms and soles with few areas of superficial desquamation    Lab Results:  Lab Results  Component Value Date   WBC 4.8 08/30/2019   HGB 11.5 (L) 08/30/2019   HCT 34.8 (L) 08/30/2019   MCV 97.5 08/30/2019   PLT 280 08/30/2019   NEUTROABS 3.0 08/30/2019    CMP  Lab Results  Component Value Date   NA 141 08/30/2019   K 3.8 08/30/2019   CL 105 08/30/2019   CO2 26 08/30/2019   GLUCOSE 98 08/30/2019   BUN 8 08/30/2019   CREATININE 0.84 08/30/2019   CALCIUM 9.0 08/30/2019   PROT 7.0 08/30/2019   ALBUMIN 4.4 08/30/2019   AST 26 08/30/2019   ALT 21 08/30/2019   ALKPHOS 96 08/30/2019   BILITOT 0.7 08/30/2019   GFRNONAA >60 08/30/2019   GFRAA >60 08/30/2019    Medications: I have reviewed the patient's current medications.   Assessment/Plan: 1. 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 4/24 lymph nodes positive, 1 tumor deposit, MSI-stable, no loss of mismatch repair protein expression ? CT abdomen/pelvis 09/22/2018-no acute finding, 10 mm right hepatic dome hypodensity-suspected cyst, nonobstructing right nephrolithiasis ? CT chest 03/28/2019-perifissural right middle lobe nodule-potentially atelectasis or scarring ? Cycle 1 adjuvant Xeloda 03/12/2019 ? Cycle 2 adjuvant Xeloda 04/02/2019 ? Cycle 3  adjuvant Xeloda 04/23/2019 ? Cycle 4 adjuvant Xeloda 05/14/2019 ? Cycle 5 adjuvant Xeloda 06/04/2019 ? Cycle 6 adjuvant Xeloda 06/25/2019 ? Cycle 7 adjuvant Xeloda 07/16/2019  ? Cycle 8 adjuvant Xeloda 08/02/2019 2. Postoperative ileus-resolved following NG tube decompression 3. History of iron deficiency anemia secondary to #1 4. Atrial fibrillation-maintained on apixaban 5. Family history of colon and esophagus cancer 6. 7 mm right middle lobe nodule on chest CT 03/28/2019-plan for repeat CT at a 3 to 89-monthinterval  Right middle lobe nodule, smaller, flat/platelike-repeat CT 12-18 months suggested    Disposition: Ms. SWoolstonhas completed adjuvant therapy.  She has mild hand/foot syndrome.  This should improve over the next few weeks.  She will return for office visit and CEA in 4 months.  GBetsy Coder MD  08/30/2019  8:57 AM

## 2019-08-30 NOTE — Telephone Encounter (Signed)
Called and left msg. Mailed printout  °

## 2019-12-19 ENCOUNTER — Ambulatory Visit (INDEPENDENT_AMBULATORY_CARE_PROVIDER_SITE_OTHER): Payer: Medicare Other | Admitting: Otolaryngology

## 2019-12-19 ENCOUNTER — Encounter (INDEPENDENT_AMBULATORY_CARE_PROVIDER_SITE_OTHER): Payer: Self-pay | Admitting: Otolaryngology

## 2019-12-19 ENCOUNTER — Other Ambulatory Visit: Payer: Self-pay

## 2019-12-19 VITALS — Temp 98.1°F

## 2019-12-19 DIAGNOSIS — H903 Sensorineural hearing loss, bilateral: Secondary | ICD-10-CM

## 2019-12-19 NOTE — Progress Notes (Addendum)
HPI: Kristen Cruz is a 83 y.o. female who presents for evaluation of hearing problems.  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  She is scheduled to get hearing aids next week. Recently she noticed some progression of her hearing problems.  No ear pain.  No vertigo.  No upper respiratory symptoms. Patient takes Eliquis.  Past Medical History:  Diagnosis Date  . Anticoagulant long-term use    eliquis  . Arthritis   . Cecum mass   . History of GI bleed 09/22/2018   upper  . HOH (hard of hearing)   . Hypertension   . IDA (iron deficiency anemia)   . Persistent atrial fibrillation [facility]) cardiologist-- dr Radford Pax   CHADS2VASC score is 4 on Apixaban  . PONV (postoperative nausea and vomiting)   . Rash    "allergic to my on body tempurature , if I have fever I break out in a rash"  . Right nephrolithiasis    per CT 09-22-2018  . Wears dentures    upper  . Wears glasses    Past Surgical History:  Procedure Laterality Date  . ABDOMINAL HYSTERECTOMY  1978   ovaries remain  . BREAST CYST EXCISION Right yrs ago   benign  . CATARACT EXTRACTION W/ INTRAOCULAR LENS  IMPLANT, BILATERAL  2014  approx.  . COLONOSCOPY  01-22-2019   dr Watt Climes  . PARTIAL COLECTOMY Right 02/06/2019   Procedure: OPEN RIGHT COLECTOMY;  Surgeon: Armandina Gemma, MD;  Location: [facility];  Service: General;  Laterality: Right;   Social History   Socioeconomic History  . Marital status: Married    Spouse name: Not on file  . Number of children: Not on file  . Years of education: Not on file  . Highest education level: Not on file  Occupational History  . Not on file  Tobacco Use  . Smoking status: Never Smoker  . Smokeless tobacco: Never Used  Substance and Sexual Activity  . Alcohol use: No  . Drug use: Never  . Sexual activity: Not on file  Other Topics Concern  . Not on file  Social  History Narrative  . Not on file   Social Determinants of Health   Financial Resource Strain:   . Difficulty of Paying Living Expenses: Not on file  Food Insecurity:   . Worried About Charity fundraiser in the Last Year: Not on file  . Ran Out of Food in the Last Year: Not on file  Transportation Needs:   . Lack of Transportation (Medical): Not on file  . Lack of Transportation (Non-Medical): Not on file  Physical Activity:   . Days of Exercise per Week: Not on file  . Minutes of Exercise per Session: Not on file  Stress:   . Feeling of Stress : Not on file  Social Connections:   . Frequency of Communication with Friends and Family: Not on file  . Frequency of Social Gatherings with Friends and Family: Not on file  . Attends Religious Services: Not on file  . Active Member of Clubs or Organizations: Not on file  . Attends Archivist Meetings: Not on file  . Marital Status: Not on file   Family History  Problem Relation Age of Onset  . Hypertension Mother   . Heart Problems Mother   . Heart attack Mother   . Cancer Sister   .  Cancer Brother    Allergies  Allergen Reactions  . Asa [Aspirin] Rash  . Bentyl [Dicyclomine Hcl] Rash  . Sucrets [Dyclonine] Rash  . Sulfa Antibiotics Rash  . Tetracycline Hcl Rash  . Tylenol [Acetaminophen] Rash   Prior to Admission medications   Medication Sig Start Date End Date Taking? Authorizing Provider  carboxymethylcellulose (REFRESH PLUS) 0.5 % SOLN 1 drop daily as needed (dry eyes).   Yes [provider]  DILT-XR 180 MG 24 hr capsule TAKE 1 CAPSULE BY MOUTH EVERY DAY 05/18/19  Yes Turner, Traci R, MD  ELIQUIS 2.5 MG TABS tablet TAKE 1 TABLET BY MOUTH TWICE A DAY 08/14/19  Yes Turner, Traci R, MD  loperamide (IMODIUM) 2 MG capsule Take 2-4 mg by mouth as needed for diarrhea or loose stools.   Yes [provider]  ondansetron (ZOFRAN-ODT) 4 MG disintegrating tablet Take 4 mg by mouth every 6 (six) hours as  needed for nausea or vomiting.   Yes [provider]  vitamin B-12 (CYANOCOBALAMIN) 1000 MCG tablet Take 1,000 mcg by mouth daily.   Yes [provider]  vitamin C (ASCORBIC ACID) 250 MG tablet Take 1 tablet (250 mg total) by mouth daily. 09/23/18  Yes Georgette Shell, MD  ferrous sulfate 325 (65 FE) MG EC tablet Take 1 tablet (325 mg total) by mouth 2 (two) times daily. 09/23/18 09/23/19  Georgette Shell, MD     Positive ROS: Otherwise negative  All other systems have been reviewed and were otherwise negative with the exception of those mentioned in the HPI and as above.  Physical Exam: Constitutional: Alert, well-appearing, no acute distress Ears: External ears without lesions or tenderness. Ear canals had minimal wax buildup on both sides which was cleaned with forceps., clear TMs  Bilaterally.  AC > BC bilaterally.  No middle ear abnormalities noted. Nasal: External nose without lesions. Septum relatively midline.. Clear nasal passages Oral: Lips and gums without lesions. Tongue and palate mucosa without lesions. Posterior oropharynx clear. Neck: No palpable adenopathy or masses Respiratory: Breathing comfortably Cardiac: Regular rate and rhythm without murmur Skin: No facial/neck lesions or rash noted.  Procedures  Assessment: Severe downsloping SNHL  Plan: Recommended to patient that she obtain another hearing test when she goes to get her hearing aids by [facility] to see if her hearing has changed much over the last 2 weeks.  She is unable to get a hearing test with our office today.  Radene Journey, MD

## 2019-12-20 ENCOUNTER — Encounter (INDEPENDENT_AMBULATORY_CARE_PROVIDER_SITE_OTHER): Payer: Self-pay

## 2019-12-31 ENCOUNTER — Inpatient Hospital Stay: Payer: Medicare Other

## 2019-12-31 ENCOUNTER — Other Ambulatory Visit: Payer: Self-pay

## 2019-12-31 ENCOUNTER — Inpatient Hospital Stay: Payer: Medicare Other | Attending: Nurse Practitioner | Admitting: Nurse Practitioner

## 2019-12-31 ENCOUNTER — Telehealth: Payer: Self-pay | Admitting: *Deleted

## 2019-12-31 ENCOUNTER — Encounter: Payer: Self-pay | Admitting: Nurse Practitioner

## 2019-12-31 VITALS — BP 171/83 | HR 105 | Temp 98.7°F | Resp 18 | Ht 62.5 in | Wt 92.8 lb

## 2019-12-31 DIAGNOSIS — Z808 Family history of malignant neoplasm of other organs or systems: Secondary | ICD-10-CM | POA: Diagnosis not present

## 2019-12-31 DIAGNOSIS — R911 Solitary pulmonary nodule: Secondary | ICD-10-CM | POA: Diagnosis not present

## 2019-12-31 DIAGNOSIS — C182 Malignant neoplasm of ascending colon: Secondary | ICD-10-CM | POA: Diagnosis not present

## 2019-12-31 DIAGNOSIS — C180 Malignant neoplasm of cecum: Secondary | ICD-10-CM | POA: Diagnosis present

## 2019-12-31 DIAGNOSIS — I4891 Unspecified atrial fibrillation: Secondary | ICD-10-CM | POA: Insufficient documentation

## 2019-12-31 DIAGNOSIS — Z800 Family history of malignant neoplasm of digestive organs: Secondary | ICD-10-CM | POA: Diagnosis not present

## 2019-12-31 LAB — CEA (IN HOUSE-CHCC): CEA (CHCC-In House): 1.49 ng/mL (ref 0.00–5.00)

## 2019-12-31 NOTE — Telephone Encounter (Signed)
-----   Message from [name], NP sent at 12/31/2019  4:37 PM EST ----- Please let her know CEA is stable in normal range.  Follow-up as scheduled.

## 2019-12-31 NOTE — Progress Notes (Signed)
  [place] OFFICE PROGRESS NOTE   Diagnosis: Colon cancer  INTERVAL HISTORY:   Kristen Cruz returns as scheduled.  No change in bowel habits.  No bleeding with bowel movements.  No abdominal pain.  She reports a good appetite and a stable weight on her home scale.  No nausea or vomiting.  She had recent hearing tests and plans to start wearing hearing aids.  She reports blood pressure is normal at home and tends to be elevated at doctor appointments.  Objective:  Vital signs in last 24 hours:  Blood pressure (!) 171/83, pulse (!) 105, temperature 98.7 F (37.1 C), temperature source Temporal, resp. rate 18, height 5' 2.5" (1.588 m), weight 92 lb 12.8 oz (42.1 kg), SpO2 100 %.    HEENT: Neck without mass. Lymphatics: No palpable cervical, supraclavicular, axillary or inguinal lymph nodes. Resp: Lungs clear bilaterally. Cardio: Regular rate and rhythm. GI: Abdomen soft and nontender.  No hepatomegaly. Vascular: No leg edema.  Lab Results:  Lab Results  Component Value Date   WBC 4.8 08/30/2019   HGB 11.5 (L) 08/30/2019   HCT 34.8 (L) 08/30/2019   MCV 97.5 08/30/2019   PLT 280 08/30/2019   NEUTROABS 3.0 08/30/2019    Imaging:  No results found.  Medications: I have reviewed the patient's current medications.  Assessment/Plan: 1. 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 4/24 lymph nodes positive, 1 tumor deposit, MSI-stable, no loss of mismatch repair protein expression ? CT abdomen/pelvis 09/22/2018-no acute finding, 10 mm right hepatic dome hypodensity-suspected cyst, nonobstructing right nephrolithiasis ? CT chest 03/28/2019-perifissural right middle lobe nodule-potentially atelectasis or scarring ? Cycle 1 adjuvant Xeloda 03/12/2019 ? Cycle 2 adjuvant Xeloda 04/02/2019 ? Cycle 3 adjuvant Xeloda 04/23/2019 ? Cycle 4 adjuvant Xeloda 05/14/2019 ? Cycle 5 adjuvant Xeloda 06/04/2019 ? Cycle 6 adjuvant Xeloda  06/25/2019 ? Cycle 7 adjuvant Xeloda 07/16/2019  ? Cycle 8 adjuvant Xeloda 08/02/2019 2. Postoperative ileus-resolved following NG tube decompression 3. History of iron deficiency anemia secondary to #1 4. Atrial fibrillation-maintained on apixaban 5. Family history of colon and esophagus cancer 6. 7 mm right middle lobe nodule on chest CT 03/28/2019-plan for repeat CT at a 3 to 37-monthinterval  08/23/2019 chest CT -right middle lobe nodule, smaller, flat/platelike-repeat CT 12-18 months suggested  Disposition: Kristen Cruz in clinical remission from colon cancer.  We will follow-up on the CEA from today.  We made a referral to Dr. MWatt Climesfor the 1 year colonoscopy.  She will undergo surveillance CT scans in approximately 2 months.  She will return for a CEA and follow-up visit in 4 months.  She will contact the office in the interim with any problems.  Plan reviewed with Dr. SBenay Spice    LNed CardANP/GNP-BC   12/31/2019  9:45 AM

## 2020-01-01 ENCOUNTER — Telehealth: Payer: Self-pay | Admitting: Oncology

## 2020-01-01 ENCOUNTER — Telehealth: Payer: Self-pay | Admitting: *Deleted

## 2020-01-01 NOTE — Telephone Encounter (Signed)
Scheduled per los. Called and left msg. Mailed printout  °

## 2020-01-01 NOTE — Telephone Encounter (Signed)
Per Ned Card NP, called and made pt aware that her CEA is stable in normal range and to f/u as scheduled. Pt verbalized understanding.

## 2020-01-01 NOTE — Telephone Encounter (Signed)
-----   Message from [name], NP sent at 12/31/2019  4:37 PM EST ----- Please let her know CEA is stable in normal range.  Follow-up as scheduled.

## 2020-01-16 ENCOUNTER — Other Ambulatory Visit: Payer: Self-pay | Admitting: Cardiology

## 2020-01-25 ENCOUNTER — Telehealth: Payer: Self-pay | Admitting: *Deleted

## 2020-01-25 ENCOUNTER — Encounter (INDEPENDENT_AMBULATORY_CARE_PROVIDER_SITE_OTHER): Payer: Self-pay

## 2020-01-25 NOTE — Telephone Encounter (Signed)
Called to f/u on referral to Dr. Watt Climes for her 1 year colonoscopy. She has not heard from [facility] yet. Called [facility] and was told she is on the call list and are waiting for clearance from oncology. They did not have a record of the referral. Faxed referral, office note and med list to [facility].

## 2020-02-13 ENCOUNTER — Encounter: Payer: Self-pay | Admitting: *Deleted

## 2020-02-13 NOTE — Progress Notes (Signed)
Confirmed w/patient that she is seeing Dr. Watt Climes on 02/27/20 regarding need for 1 year colonoscopy.

## 2020-02-14 ENCOUNTER — Other Ambulatory Visit: Payer: Self-pay | Admitting: Cardiology

## 2020-02-27 ENCOUNTER — Telehealth: Payer: Self-pay | Admitting: *Deleted

## 2020-02-27 NOTE — Telephone Encounter (Signed)
Pt takes Eliquis for afib with CHADS2VASc score of 4 (age x2, sex, HTN). SCr 0.84, CrCl 59mL/min. Ok to hold Eliquis for 2 days prior to procedure.

## 2020-02-27 NOTE — Telephone Encounter (Signed)
   [facility] Pre-operative Risk Assessment    Request for surgical clearance:  1. What type of surgery is being performed? COLONOSCOPY   2. When is this surgery scheduled? 04/01/20   3. What type of clearance is required (medical clearance vs. Pharmacy clearance to hold med vs. Both)? BOTH  4. Are there any medications that need to be held prior to surgery and how long? ELIQUIS   5. Practice name and name of physician performing surgery? [facility]; DR. MAGOD   6. What is your office phone number [phone]    7.   What is your office fax number [phone]  8.   Anesthesia type (None, local, MAC, general) ? NOT LISTED; PROPOFOL?   Julaine Hua 02/27/2020, 2:18 PM  _________________________________________________________________   (provider comments below)

## 2020-02-27 NOTE — Telephone Encounter (Signed)
Pt made aware she needs pre op clearance. Pt procedure is set for 04/01/20 and she is scheduled to see Dr. Radford Pax 04/03/20. I explained to the pt that she will need an appt before 04/01/20 in order to be cleared for her procedure. I had offered an appt with Richardson Dopp, [facility] 03/17/20 @ 8:15. Pt states she has not seen Dr. Radford Pax since 2019 and she wants to only see Dr. Radford Pax.  I tried to find another appt with Dr. Radford Pax and even had Dr. Theodosia Blender nurse help look MD schedule. Pt did decide then it would be fine to schedule with Richardson Dopp, [facility] 03/17/20 @ 8:15 am for pre op clearance. Pt said she has to have another test done still and that she may not have to have colonoscopy. I explained to her that if she finds she does not need to proceed with colonoscopy then she can cancel Richardson Dopp, [facility] and keep Dr. Radford Pax 04/03/20 appt. Pt is agreeable to plan of care. I will send clearance notes to Richardson Dopp, [facility] for upcoming appt. I will send FYI to Dr. Watt Climes pt has appt for pre op clearance. I advised the pt that if she comes in for her pre op appt and still wants to see Dr. Radford Pax on 4/15 that will be her choice. Pt thanked me for the help.

## 2020-02-27 NOTE — Telephone Encounter (Signed)
   Primary Cardiologist:Traci Turner, MD  Chart reviewed as part of pre-operative protocol coverage. Because of Kristen Cruz past medical history and time since last visit, he/she will require a follow-up visit in order to better assess preoperative cardiovascular risk.  She was last seen over a year ago on 02/22/19. She should have time for an appointment prior to her study on 4/13.  Pre-op covering staff: - Please schedule appointment and call patient to inform them. - Please contact requesting surgeon's office via preferred method (i.e, phone, fax) to inform them of need for appointment prior to surgery.  If applicable, this message will also be routed to pharmacy pool and/or primary cardiologist for input on holding anticoagulant/antiplatelet agent as requested below so that this information is available at time of patient's appointment.   Daune Perch, NP  02/27/2020, 2:43 PM

## 2020-02-28 ENCOUNTER — Inpatient Hospital Stay: Payer: Medicare Other | Attending: Nurse Practitioner

## 2020-02-28 ENCOUNTER — Other Ambulatory Visit: Payer: Self-pay

## 2020-02-28 DIAGNOSIS — C182 Malignant neoplasm of ascending colon: Secondary | ICD-10-CM

## 2020-02-28 DIAGNOSIS — C180 Malignant neoplasm of cecum: Secondary | ICD-10-CM | POA: Diagnosis present

## 2020-02-28 LAB — BASIC METABOLIC PANEL - CANCER CENTER ONLY
Anion gap: 8 (ref 5–15)
BUN: 12 mg/dL (ref 8–23)
CO2: 27 mmol/L (ref 22–32)
Calcium: 8.8 mg/dL — ABNORMAL LOW (ref 8.9–10.3)
Chloride: 106 mmol/L (ref 98–111)
Creatinine: 0.78 mg/dL (ref 0.44–1.00)
GFR, Est AFR Am: >60 mL/min (ref >60)
GFR, Estimated: >60 mL/min (ref >60)
Glucose, Bld: 93 mg/dL (ref 70–99)
Potassium: 4.2 mmol/L (ref 3.5–5.1)
Sodium: 141 mmol/L (ref 135–145)

## 2020-03-07 ENCOUNTER — Other Ambulatory Visit: Payer: Self-pay | Admitting: Cardiology

## 2020-03-07 NOTE — Telephone Encounter (Signed)
Pt last saw Daune Perch, NP on 02/22/19, overdue for follow-up. Pt has appt to see Richardson Dopp, PA on 03/17/20.  Age 83, weight 42.1kg, last labs 02/28/20 Creat 0.78, based on specified criteria pt is on appropriate dosage of Eliquis 2.5mg  BID. Will refill rx x 1 to get pt to upcoming appt since pt is overdue for follow-up.

## 2020-03-09 ENCOUNTER — Other Ambulatory Visit: Payer: Self-pay | Admitting: Cardiology

## 2020-03-16 NOTE — Progress Notes (Signed)
Cardiology Office Note:    Date:  03/17/2020   ID:  Kristen Cruz, DOB December 09, 1936, MRN [id number]  PCP:  Eulas Post, MD  Cardiologist:  Fransico Him, MD   Electrophysiologist:  None   Referring MD: Eulas Post, MD   Chief Complaint:  Follow-up (AFib; Needs Surgical Clearance)    Patient Profile:    Kristen Cruz is a 84 y.o. female with:   Paroxysmal atrial fibrillation   CHA2DS2-VASc=4 (female, age x 2, >> Apixaban  Hypertension   Coronary Ca noted on CT (08/2019)  Colon CA s/p R colectomy in 01/2019  Fe deficiency anemia  Prior CV studies: Echocardiogram 03/08/14 EF 55-65, no RWMA  History of Present Illness:    Kristen Cruz was last seen in clinic in 02/2019.    She needs a follow-up colonoscopy.  She returns for surgical clearance.  Since last seen, she has done well.  She has felt much better since coming off of chemotherapy.  She has not had chest discomfort, significant shortness of breath, orthopnea, syncope or leg swelling.  She has not had any bleeding issues.  She remains fairly active.  She exercises on a regular basis and works out in her garden.     Past Medical History:  Diagnosis Date  . Anticoagulant long-term use    eliquis  . Arthritis   . Cecum mass   . History of GI bleed 09/22/2018   upper  . HOH (hard of hearing)   . Hypertension   . IDA (iron deficiency anemia)   . Persistent atrial fibrillation [facility]) cardiologist-- dr Radford Pax   CHADS2VASC score is 4 on Apixaban  . PONV (postoperative nausea and vomiting)   . Rash    "allergic to my on body tempurature , if I have fever I break out in a rash"  . Right nephrolithiasis    per CT 09-22-2018  . Wears dentures    upper  . Wears glasses     Current Medications: Current Meds  Medication Sig  . apixaban (ELIQUIS) 2.5 MG TABS tablet Take 1 tablet (2.5 mg total) by mouth 2 (two) times daily. Pt is Overdue for follow-up with MD, MUST see MD for future refills.  Marland Kitchen  diltiazem (DILT-XR) 180 MG 24 hr capsule Take 1 capsule (180 mg total) by mouth daily. Please keep upcoming appt for refills. Thank you  . loperamide (IMODIUM) 2 MG capsule Take 2-4 mg by mouth as needed for diarrhea or loose stools.  . ondansetron (ZOFRAN-ODT) 4 MG disintegrating tablet Take 4 mg by mouth every 6 (six) hours as needed for nausea or vomiting.  . RESTASIS 0.05 % ophthalmic emulsion 1 drop 2 (two) times daily.  . vitamin B-12 (CYANOCOBALAMIN) 1000 MCG tablet Take 1,000 mcg by mouth daily.  . vitamin C (ASCORBIC ACID) 250 MG tablet Take 1 tablet (250 mg total) by mouth daily.     Allergies:   Asa [aspirin], Bentyl [dicyclomine hcl], Sucrets [dyclonine], Sulfa antibiotics, Tetracycline hcl, and Tylenol [acetaminophen]   Social History   Tobacco Use  . Smoking status: Never Smoker  . Smokeless tobacco: Never Used  Substance Use Topics  . Alcohol use: No  . Drug use: Never     Family Hx: The patient's family history includes Cancer in her brother and sister; Heart Problems in her mother; Heart attack in her mother; Hypertension in her mother.  ROS   EKGs/Labs/Other Test Reviewed:    EKG:  EKG is   ordered today.  The ekg ordered today demonstrates normal sinus rhythm, heart rate 90, left axis deviation, incomplete right bundle branch block, QTC 392, no change from prior tracing  Recent Labs: 08/30/2019: ALT 21; Hemoglobin 11.5; Platelet Count 280 02/28/2020: BUN 12; Creatinine 0.78; Potassium 4.2; Sodium 141   Recent Lipid Panel No results found for: CHOL, TRIG, HDL, CHOLHDL, LDLCALC, LDLDIRECT  Physical Exam:    VS:  BP (!) 160/60   Pulse (!) 148   Ht 5\' 2"  (1.575 m)   Wt 95 lb (43.1 kg)   SpO2 94%   BMI 17.38 kg/m     Wt Readings from Last 3 Encounters:  03/17/20 95 lb (43.1 kg)  12/31/19 92 lb 12.8 oz (42.1 kg)  08/30/19 98 lb 3.2 oz (44.5 kg)     Constitutional:      Appearance: Healthy appearance. Not in distress.  Neck:     Vascular: JVD normal.    Pulmonary:     Breath sounds: No wheezing. No rales.  Cardiovascular:     Normal rate. Regular rhythm. Normal S1. Normal S2.     Murmurs: There is no murmur.  Edema:    Peripheral edema absent.  Abdominal:     Palpations: Abdomen is soft. There is no hepatomegaly.  Skin:    General: Skin is warm and dry.  Neurological:     General: No focal deficit present.     Mental Status: Alert and oriented to person, place and time.       ASSESSMENT & PLAN:    1. Preoperative cardiovascular examination She does not have any unstable cardiac conditions.  Her perioperative risk of major cardiac event is low at 0.4% according to the revised cardiac risk index (RCRI).  She can clearly achieve >4 METs.  Therefore according to [facility] and AHA guidelines, she may proceed with her colonoscopy without further CV testing.  She may hold her Apixaban for 2 days prior to her procedure and resume it as soon as possible postop.  2. Paroxysmal atrial fibrillation (HCC) Maintaining sinus rhythm.  She is tolerating anticoagulation.  She is on the appropriate dose according to her age and weight.  Recent creatinine normal.  Continue current therapy.  3. Essential hypertension, benign Blood pressures at home are optimal.  Her blood pressure usually runs higher in the office.  She notes her pressures at home are 105/60s.  Continue current therapy.  Dispo:  Return in about 6 months (around 09/17/2020) for Routine Follow Up w/ Dr. Radford Pax, in person.   Medication Adjustments/Labs and Tests Ordered: Current medicines are reviewed at length with the patient today.  Concerns regarding medicines are outlined above.  Tests Ordered: Orders Placed This Encounter  Procedures  . EKG 12-Lead   Medication Changes: No orders of the defined types were placed in this encounter.   Signed, Richardson Dopp, PA-C  03/17/2020 8:59 AM    [facility] [address], [place]  [address] Phone: [phone]; Fax: [phone]

## 2020-03-17 ENCOUNTER — Ambulatory Visit (INDEPENDENT_AMBULATORY_CARE_PROVIDER_SITE_OTHER): Payer: Medicare Other | Admitting: Physician Assistant

## 2020-03-17 ENCOUNTER — Encounter: Payer: Self-pay | Admitting: Physician Assistant

## 2020-03-17 ENCOUNTER — Other Ambulatory Visit: Payer: Self-pay

## 2020-03-17 VITALS — BP 160/60 | HR 148 | Ht 62.0 in | Wt 95.0 lb

## 2020-03-17 DIAGNOSIS — Z01810 Encounter for preprocedural cardiovascular examination: Secondary | ICD-10-CM

## 2020-03-17 DIAGNOSIS — I480 Paroxysmal atrial fibrillation: Secondary | ICD-10-CM

## 2020-03-17 DIAGNOSIS — I10 Essential (primary) hypertension: Secondary | ICD-10-CM

## 2020-03-17 NOTE — Patient Instructions (Signed)
Medication Instructions:   Your physician recommends that you continue on your current medications as directed. Please refer to the Current Medication list given to you today.  *If you need a refill on your cardiac medications before your next appointment, please call your pharmacy*  Lab Work:  None ordered today  If you have labs (blood work) drawn today and your tests are completely normal, you will receive your results only by: Marland Kitchen MyChart Message (if you have MyChart) OR . A paper copy in the mail If you have any lab test that is abnormal or we need to change your treatment, we will call you to review the results.  Testing/Procedures:  None ordered today  Follow-Up: At [facility], you and your health needs are our priority.  As part of our continuing mission to provide you with exceptional heart care, we have created designated Provider Care Teams.  These Care Teams include your primary Cardiologist (physician) and Advanced Practice Providers (APPs -  Physician Assistants and Nurse Practitioners) who all work together to provide you with the care you need, when you need it.  We recommend signing up for the patient portal called "MyChart".  Sign up information is provided on this After Visit Summary.  MyChart is used to connect with patients for Virtual Visits (Telemedicine).  Patients are able to view lab/test results, encounter notes, upcoming appointments, etc.  Non-urgent messages can be sent to your provider as well.   To learn more about what you can do with MyChart, go to [email or web address].    Your next appointment:   6 month(s)  The format for your next appointment:   In Person  Provider:   Fransico Him, MD

## 2020-03-17 NOTE — Telephone Encounter (Signed)
See my OV note from today. Richardson Dopp, PA-C    03/17/2020 2:41 PM

## 2020-03-24 ENCOUNTER — Other Ambulatory Visit: Payer: Self-pay

## 2020-03-24 ENCOUNTER — Encounter (HOSPITAL_COMMUNITY): Payer: Self-pay | Admitting: Radiology

## 2020-03-24 ENCOUNTER — Ambulatory Visit (HOSPITAL_COMMUNITY)
Admission: RE | Admit: 2020-03-24 | Discharge: 2020-03-24 | Disposition: A | Discharge status: Home / Self Care | Payer: Medicare Other | Source: Ambulatory Visit | Attending: Nurse Practitioner | Admitting: Nurse Practitioner

## 2020-03-24 DIAGNOSIS — C182 Malignant neoplasm of ascending colon: Secondary | ICD-10-CM | POA: Diagnosis present

## 2020-03-24 MED ORDER — SODIUM CHLORIDE (PF) 0.9 % IJ SOLN
INTRAMUSCULAR | Status: AC
  Filled 2020-03-24: qty 50

## 2020-03-24 MED ORDER — IOHEXOL 300 MG/ML  SOLN
100.0000 mL | Freq: Once | INTRAMUSCULAR | Status: AC | PRN
  Administered 2020-03-24: 12:00:00 80 mL via INTRAVENOUS

## 2020-03-26 ENCOUNTER — Telehealth: Payer: Self-pay | Admitting: *Deleted

## 2020-03-26 NOTE — Telephone Encounter (Signed)
Per Dr. Benay Spice, called to make pt aware that her CT scans showed negative and f/u as scheduled. Pt was appreciative and verbalized understanding.

## 2020-03-26 NOTE — Telephone Encounter (Signed)
-----   Message from Ladell Pier, MD sent at 03/26/2020  2:21 PM EDT ----- Please call patient, CTs are negative for cancer, follow-up as scheduled

## 2020-04-01 ENCOUNTER — Encounter: Payer: Self-pay | Admitting: Gastroenterology

## 2020-04-03 ENCOUNTER — Ambulatory Visit: Payer: Medicare Other | Admitting: Cardiology

## 2020-04-04 ENCOUNTER — Other Ambulatory Visit: Payer: Self-pay | Admitting: Cardiology

## 2020-04-11 ENCOUNTER — Observation Stay (HOSPITAL_COMMUNITY)
Admission: EM | Admit: 2020-04-11 | Discharge: 2020-04-11 | Disposition: A | Discharge status: Home / Self Care | Payer: Medicare Other | Attending: Internal Medicine | Admitting: Internal Medicine

## 2020-04-11 ENCOUNTER — Telehealth: Payer: Self-pay | Admitting: Emergency Medicine

## 2020-04-11 ENCOUNTER — Other Ambulatory Visit: Payer: Self-pay

## 2020-04-11 ENCOUNTER — Encounter (HOSPITAL_COMMUNITY): Payer: Self-pay

## 2020-04-11 ENCOUNTER — Telehealth: Payer: Self-pay

## 2020-04-11 DIAGNOSIS — Z79899 Other long term (current) drug therapy: Secondary | ICD-10-CM | POA: Diagnosis not present

## 2020-04-11 DIAGNOSIS — I480 Paroxysmal atrial fibrillation: Secondary | ICD-10-CM | POA: Diagnosis present

## 2020-04-11 DIAGNOSIS — Z923 Personal history of irradiation: Secondary | ICD-10-CM | POA: Diagnosis not present

## 2020-04-11 DIAGNOSIS — Z9221 Personal history of antineoplastic chemotherapy: Secondary | ICD-10-CM | POA: Insufficient documentation

## 2020-04-11 DIAGNOSIS — Z961 Presence of intraocular lens: Secondary | ICD-10-CM | POA: Insufficient documentation

## 2020-04-11 DIAGNOSIS — Z9842 Cataract extraction status, left eye: Secondary | ICD-10-CM | POA: Insufficient documentation

## 2020-04-11 DIAGNOSIS — M1990 Unspecified osteoarthritis, unspecified site: Secondary | ICD-10-CM | POA: Diagnosis not present

## 2020-04-11 DIAGNOSIS — D509 Iron deficiency anemia, unspecified: Secondary | ICD-10-CM | POA: Diagnosis not present

## 2020-04-11 DIAGNOSIS — I10 Essential (primary) hypertension: Secondary | ICD-10-CM | POA: Insufficient documentation

## 2020-04-11 DIAGNOSIS — K625 Hemorrhage of anus and rectum: Secondary | ICD-10-CM | POA: Diagnosis not present

## 2020-04-11 DIAGNOSIS — Z7901 Long term (current) use of anticoagulants: Secondary | ICD-10-CM | POA: Insufficient documentation

## 2020-04-11 DIAGNOSIS — Z9841 Cataract extraction status, right eye: Secondary | ICD-10-CM | POA: Diagnosis not present

## 2020-04-11 DIAGNOSIS — I4819 Other persistent atrial fibrillation: Secondary | ICD-10-CM | POA: Diagnosis not present

## 2020-04-11 DIAGNOSIS — Z886 Allergy status to analgesic agent status: Secondary | ICD-10-CM | POA: Diagnosis not present

## 2020-04-11 DIAGNOSIS — Z86010 Personal history of colonic polyps: Secondary | ICD-10-CM | POA: Insufficient documentation

## 2020-04-11 DIAGNOSIS — Z20822 Contact with and (suspected) exposure to covid-19: Secondary | ICD-10-CM | POA: Insufficient documentation

## 2020-04-11 DIAGNOSIS — E7800 Pure hypercholesterolemia, unspecified: Secondary | ICD-10-CM | POA: Diagnosis not present

## 2020-04-11 DIAGNOSIS — Z87442 Personal history of urinary calculi: Secondary | ICD-10-CM | POA: Diagnosis not present

## 2020-04-11 DIAGNOSIS — Z882 Allergy status to sulfonamides status: Secondary | ICD-10-CM | POA: Insufficient documentation

## 2020-04-11 DIAGNOSIS — K648 Other hemorrhoids: Secondary | ICD-10-CM | POA: Diagnosis not present

## 2020-04-11 DIAGNOSIS — Z90710 Acquired absence of both cervix and uterus: Secondary | ICD-10-CM | POA: Insufficient documentation

## 2020-04-11 DIAGNOSIS — Z809 Family history of malignant neoplasm, unspecified: Secondary | ICD-10-CM | POA: Insufficient documentation

## 2020-04-11 DIAGNOSIS — K5730 Diverticulosis of large intestine without perforation or abscess without bleeding: Secondary | ICD-10-CM | POA: Insufficient documentation

## 2020-04-11 DIAGNOSIS — Z888 Allergy status to other drugs, medicaments and biological substances status: Secondary | ICD-10-CM | POA: Insufficient documentation

## 2020-04-11 DIAGNOSIS — Z85038 Personal history of other malignant neoplasm of large intestine: Secondary | ICD-10-CM | POA: Insufficient documentation

## 2020-04-11 DIAGNOSIS — Z881 Allergy status to other antibiotic agents status: Secondary | ICD-10-CM | POA: Insufficient documentation

## 2020-04-11 DIAGNOSIS — Z8249 Family history of ischemic heart disease and other diseases of the circulatory system: Secondary | ICD-10-CM | POA: Insufficient documentation

## 2020-04-11 DIAGNOSIS — C189 Malignant neoplasm of colon, unspecified: Secondary | ICD-10-CM | POA: Diagnosis present

## 2020-04-11 DIAGNOSIS — Z9049 Acquired absence of other specified parts of digestive tract: Secondary | ICD-10-CM | POA: Diagnosis not present

## 2020-04-11 LAB — CBC WITH DIFFERENTIAL/PLATELET
Abs Immature Granulocytes: 0.03 10*3/uL (ref 0.00–0.07)
Basophils Absolute: 0 10*3/uL (ref 0.0–0.1)
Basophils Relative: 1 %
Eosinophils Absolute: 0 10*3/uL (ref 0.0–0.5)
Eosinophils Relative: 0 %
HCT: 38.3 % (ref 36.0–46.0)
Hemoglobin: 12.1 g/dL (ref 12.0–15.0)
Immature Granulocytes: 0 %
Lymphocytes Relative: 13 %
Lymphs Abs: 1 10*3/uL (ref 0.7–4.0)
MCH: 29 pg (ref 26.0–34.0)
MCHC: 31.6 g/dL (ref 30.0–36.0)
MCV: 91.8 fL (ref 80.0–100.0)
Monocytes Absolute: 0.6 10*3/uL (ref 0.1–1.0)
Monocytes Relative: 8 %
Neutro Abs: 5.9 10*3/uL (ref 1.7–7.7)
Neutrophils Relative %: 78 %
Platelets: 245 10*3/uL (ref 150–400)
RBC: 4.17 MIL/uL (ref 3.87–5.11)
RDW: 12.9 % (ref 11.5–15.5)
WBC: 7.6 10*3/uL (ref 4.0–10.5)
nRBC: 0 % (ref 0.0–0.2)

## 2020-04-11 LAB — COMPREHENSIVE METABOLIC PANEL
ALT: 14 U/L (ref 0–44)
AST: 20 U/L (ref 15–41)
Albumin: 4.4 g/dL (ref 3.5–5.0)
Alkaline Phosphatase: 96 U/L (ref 38–126)
Anion gap: 10 (ref 5–15)
BUN: 12 mg/dL (ref 8–23)
CO2: 26 mmol/L (ref 22–32)
Calcium: 9 mg/dL (ref 8.9–10.3)
Chloride: 104 mmol/L (ref 98–111)
Creatinine, Ser: 0.7 mg/dL (ref 0.44–1.00)
GFR calc Af Amer: >60 mL/min (ref >60)
GFR calc non Af Amer: >60 mL/min (ref >60)
Glucose, Bld: 109 mg/dL — ABNORMAL HIGH (ref 70–99)
Potassium: 3.7 mmol/L (ref 3.5–5.1)
Sodium: 140 mmol/L (ref 135–145)
Total Bilirubin: 0.7 mg/dL (ref 0.3–1.2)
Total Protein: 7.6 g/dL (ref 6.5–8.1)

## 2020-04-11 LAB — URINALYSIS, ROUTINE W REFLEX MICROSCOPIC
Bacteria, UA: NONE SEEN
Bilirubin Urine: NEGATIVE
Glucose, UA: NEGATIVE mg/dL
Ketones, ur: NEGATIVE mg/dL
Leukocytes,Ua: NEGATIVE
Nitrite: NEGATIVE
Protein, ur: NEGATIVE mg/dL
Specific Gravity, Urine: 1.004 — ABNORMAL LOW (ref 1.005–1.030)
pH: 7 (ref 5.0–8.0)

## 2020-04-11 LAB — RESPIRATORY PANEL BY RT PCR (FLU A&B, COVID)
Influenza A by PCR: NEGATIVE
Influenza B by PCR: NEGATIVE
SARS Coronavirus 2 by RT PCR: NEGATIVE

## 2020-04-11 LAB — TYPE AND SCREEN
ABO/RH(D): O POS
Antibody Screen: NEGATIVE

## 2020-04-11 LAB — LIPASE, BLOOD: Lipase: 39 U/L (ref 11–51)

## 2020-04-11 LAB — POC OCCULT BLOOD, ED: Fecal Occult Bld: POSITIVE — AB

## 2020-04-11 MED ORDER — ONDANSETRON HCL 4 MG PO TABS: 4.0000 mg | ORAL_TABLET | Freq: Four times a day (QID) | ORAL | Status: DC | PRN

## 2020-04-11 MED ORDER — CYCLOSPORINE 0.05 % OP EMUL: 1.0000 [drp] | Freq: Two times a day (BID) | OPHTHALMIC | Status: DC

## 2020-04-11 MED ORDER — ACETAMINOPHEN 325 MG PO TABS: 650.0000 mg | ORAL_TABLET | Freq: Four times a day (QID) | ORAL | Status: DC | PRN

## 2020-04-11 MED ORDER — ONDANSETRON HCL 4 MG/2ML IJ SOLN: 4.0000 mg | Freq: Four times a day (QID) | INTRAMUSCULAR | Status: DC | PRN

## 2020-04-11 MED ORDER — DILTIAZEM HCL ER 180 MG PO CP24: 180.0000 mg | ORAL_CAPSULE | Freq: Every day | ORAL | Status: DC

## 2020-04-11 MED ORDER — SODIUM CHLORIDE 0.9 % IV SOLN: INTRAVENOUS | Status: DC

## 2020-04-11 MED ORDER — ACETAMINOPHEN 650 MG RE SUPP: 650.0000 mg | Freq: Four times a day (QID) | RECTAL | Status: DC | PRN

## 2020-04-11 NOTE — ED Provider Notes (Addendum)
[facility] DEPT Provider Note   CSN: [id number] Arrival date & time: 04/11/20  1049     History Chief Complaint  Patient presents with  . Rectal Bleeding    Kristen Cruz is a 84 y.o. female.  The history is provided by the patient and medical records. No language interpreter was used.  Rectal Bleeding    84 year old female with history of upper GI bleed, colon cancer, atrial fibrillation currently on Eliquis, presented to ED for evaluation of rectal bleeding.  Patient states she has history of hemorrhoid and occasionally would have hemorrhoidal bleed.  She recently had a colonoscopy done 10 days ago for screening test as she has prior history of colon cancer.  States that she had a benign polyps that was removed but otherwise her colonoscopy was fairly unremarkable.  This morning when having bowel movements she noticed blood in the toilet bowl.  She described as bright red blood.  It happened a few times throughout the day which concerns her.  She believes it is likely to be hemorrhoidal bleed.  She denies constipation or diarrhea.  She denies any abdominal pain or rectal pain with rectal itchiness.  No lightheadedness or dizziness no chest pain or shortness of breath.  Due to being on blood thinner medication, she reached out to her doctor who recommend patient come to ER for further evaluation.   Past Medical History:  Diagnosis Date  . Anticoagulant long-term use    eliquis  . Arthritis   . Cecum mass   . History of GI bleed 09/22/2018   upper  . HOH (hard of hearing)   . Hypertension   . IDA (iron deficiency anemia)   . Persistent atrial fibrillation [facility]) cardiologist-- dr Radford Pax   CHADS2VASC score is 4 on Apixaban  . PONV (postoperative nausea and vomiting)   . Rash    "allergic to my on body tempurature , if I have fever I break out in a rash"  . Right nephrolithiasis    per CT 09-22-2018  . Wears dentures    upper  . Wears  glasses     Patient Active Problem List   Diagnosis Date Noted  . Colon cancer ([place]) 02/26/2019  . Bradycardia following surgery 02/20/2019  . SBO (small bowel obstruction) ([place]) 02/14/2019  . Colonic mass 02/06/2019  . Mass of cecum 02/05/2019  . Low serum vitamin B12 12/17/2018  . GI bleed 09/22/2018  . Symptomatic anemia 09/22/2018  . Acute GI bleeding 09/22/2018  . On apixaban therapy   . Incomplete RBBB 02/27/2015  . Sinus tachycardia 02/20/2014  . Heart murmur 02/20/2014  . Paroxysmal atrial fibrillation (HCC)   . Essential hypertension, benign 02/19/2014  . Pure hypercholesterolemia 02/19/2014    Past Surgical History:  Procedure Laterality Date  . ABDOMINAL HYSTERECTOMY  1978   ovaries remain  . BREAST CYST EXCISION Right yrs ago   benign  . CATARACT EXTRACTION W/ INTRAOCULAR LENS  IMPLANT, BILATERAL  2014  approx.  . COLONOSCOPY  01-22-2019   dr Watt Climes  . PARTIAL COLECTOMY Right 02/06/2019   Procedure: OPEN RIGHT COLECTOMY;  Surgeon: Armandina Gemma, MD;  Location: [facility];  Service: General;  Laterality: Right;     OB History   No obstetric history on file.     Family History  Problem Relation Age of Onset  . Hypertension Mother   . Heart Problems Mother   . Heart attack Mother   . Cancer Sister   . Cancer  Brother     Social History   Tobacco Use  . Smoking status: Never Smoker  . Smokeless tobacco: Never Used  Substance Use Topics  . Alcohol use: No  . Drug use: Never    Home Medications Prior to Admission medications   Medication Sig Start Date End Date Taking? Authorizing Provider  apixaban (ELIQUIS) 2.5 MG TABS tablet Take 1 tablet (2.5 mg total) by mouth 2 (two) times daily. Pt is Overdue for follow-up with MD, MUST see MD for future refills. 03/07/20   Sueanne Margarita, MD  diltiazem (DILT-XR) 180 MG 24 hr capsule Take 1 capsule (180 mg total) by mouth daily. 04/04/20   Sueanne Margarita, MD  loperamide (IMODIUM) 2 MG capsule Take 2-4 mg by mouth  as needed for diarrhea or loose stools.    [provider]  ondansetron (ZOFRAN-ODT) 4 MG disintegrating tablet Take 4 mg by mouth every 6 (six) hours as needed for nausea or vomiting.    [provider]  RESTASIS 0.05 % ophthalmic emulsion 1 drop 2 (two) times daily. 01/25/20   [provider]  vitamin B-12 (CYANOCOBALAMIN) 1000 MCG tablet Take 1,000 mcg by mouth daily.    [provider]  vitamin C (ASCORBIC ACID) 250 MG tablet Take 1 tablet (250 mg total) by mouth daily. 09/23/18   Georgette Shell, MD    Allergies    Asa [aspirin], Bentyl [dicyclomine hcl], Sucrets [dyclonine], Sulfa antibiotics, Tetracycline hcl, and Tylenol [acetaminophen]  Review of Systems   Review of Systems  Gastrointestinal: Positive for hematochezia.  All other systems reviewed and are negative.   Physical Exam Updated Vital Signs BP (!) 168/84 (BP Location: Left Arm)   Pulse (!) 101   Temp 98.8 F (37.1 C) (Oral)   Resp 16   SpO2 100%   Physical Exam Vitals and nursing note reviewed.  Constitutional:      General: She is not in acute distress.    Appearance: She is well-developed.     Comments: Elderly female appears to be in no acute discomfort.  HENT:     Head: Atraumatic.  Eyes:     Conjunctiva/sclera: Conjunctivae normal.  Cardiovascular:     Rate and Rhythm: Normal rate and regular rhythm.     Pulses: Normal pulses.     Heart sounds: Murmur present.  Pulmonary:     Effort: Pulmonary effort is normal.     Breath sounds: Normal breath sounds.  Abdominal:     Palpations: Abdomen is soft.     Tenderness: There is no abdominal tenderness.  Genitourinary:    Comments: Chaperone present during exam.  Several external hemorrhoid noted on exam.  Bright red blood per rectum.  No thrombosed hemorrhoid.  No rectal mass appreciated and no stool impaction. Musculoskeletal:     Cervical back: Neck supple.  Skin:    Findings: No rash.  Neurological:     Mental  Status: She is alert and oriented to person, place, and time.  Psychiatric:        Mood and Affect: Mood normal.     ED Results / Procedures / Treatments   Labs (all labs ordered are listed, but only abnormal results are displayed) Labs Reviewed  COMPREHENSIVE METABOLIC PANEL - Abnormal; Notable for the following components:      Result Value   Glucose, Bld 109 (*)    All other components within normal limits  URINALYSIS, ROUTINE W REFLEX MICROSCOPIC - Abnormal; Notable for the following components:  Color, Urine COLORLESS (*)    Specific Gravity, Urine 1.004 (*)    Hgb urine dipstick MODERATE (*)    All other components within normal limits  POC OCCULT BLOOD, ED - Abnormal; Notable for the following components:   Fecal Occult Bld POSITIVE (*)    All other components within normal limits  RESPIRATORY PANEL BY RT PCR (FLU A&B, COVID)  CBC WITH DIFFERENTIAL/PLATELET  LIPASE, BLOOD  TYPE AND SCREEN    EKG None  Radiology No results found.  Procedures Procedures (including critical care time)  Medications Ordered in ED Medications  diltiazem (DILACOR XR) 24 hr capsule 180 mg (has no administration in time range)  cycloSPORINE (RESTASIS) 0.05 % ophthalmic emulsion 1 drop (has no administration in time range)  0.9 %  sodium chloride infusion (has no administration in time range)  ondansetron (ZOFRAN) tablet 4 mg (has no administration in time range)    Or  ondansetron (ZOFRAN) injection 4 mg (has no administration in time range)    ED Course  I have reviewed the triage vital signs and the nursing notes.  Pertinent labs & imaging results that were available during my care of the patient were reviewed by me and considered in my medical decision making (see chart for details).    MDM Rules/Calculators/A&P                      BP (!) 168/84 (BP Location: Left Arm)   Pulse (!) 101   Temp 98.8 F (37.1 C) (Oral)   Resp 16   SpO2 100%   Final Clinical Impression(s)  / ED Diagnoses Final diagnoses:  Rectal bleeding    Rx / DC Orders ED Discharge Orders    None     11:36 AM Patient report bright red blood per rectum without any rectal pain no abdominal pain.    She recently had screening colonoscopy 10 days ago and had one benign polyps removed.  Suspect post polypectomy bleed vs hemorrhoidal bleed.  She is currently on Eliquis.  Will check labs and will monitor.  2:12 PM Fecal occult blood test is positive.  Hemoglobin stable at 12.1.  Labs are reassuring.  Patient has not had any bleeding since.  I appreciate consultation from GI specialist, Dr. Michail Sermon, who recommend medicine admission for observation and he will be involved in patient's care.  2:41 PM Appreciate consultation from [facility] Dr. Starla Link who agrees to see and admit pt for obs.  Screening COVID-19 test ordered.   2:50 PM After evaluating the pt, Dr. Michail Sermon felt she can be discharge as the bleeding is likely hemorrhoidal and also because pt request to go home to take care of her ailing husband.  She understand to return if her sxs worsen.   Kristen Cruz was evaluated in Emergency Department on 04/11/2020 for the symptoms described in the history of present illness. She was evaluated in the context of the global COVID-19 pandemic, which necessitated consideration that the patient might be at risk for infection with the SARS-CoV-2 virus that causes COVID-19. Institutional protocols and algorithms that pertain to the evaluation of patients at risk for COVID-19 are in a state of rapid change based on information released by regulatory bodies including the CDC and federal and state organizations. These policies and algorithms were followed during the patient's care in the ED.    Domenic Moras, PA-C 04/11/20 1442    Domenic Moras, PA-C 04/11/20 1514    Veryl Speak, MD  04/11/20 1519  

## 2020-04-11 NOTE — Consult Note (Signed)
Referring Provider: ER Primary Care Physician:  Eulas Post, MD Primary Gastroenterologist:  Dr. Watt Climes  Reason for Consultation:  Rectal bleeding  HPI: Kristen Cruz is a 84 y.o. female with history of colon cancer in 2020 and s/p right hemicolectomy and chemoradiation who had a surveillance colonoscopy 10 days ago where a diminutive colon polyp removed with a cold biopsy from the ascending colon; medium-internal hemorrhoids, sigmoid diverticulosis, and a normal-appearing ileocolonic anastomosis. She is on Eliquis for Afib and resumed it 2 days after her colonoscopy. This morning she had the acute onset of painless bright red blood per rectum into the toilet that occurred 2 times. Denies any rectal pain, abdominal pain, dizziness or lightheadedness. Hgb 12.1.  Past Medical History:  Diagnosis Date  . Anticoagulant long-term use    eliquis  . Arthritis   . Cecum mass   . History of GI bleed 09/22/2018   upper  . HOH (hard of hearing)   . Hypertension   . IDA (iron deficiency anemia)   . Persistent atrial fibrillation [facility]) cardiologist-- dr Radford Pax   CHADS2VASC score is 4 on Apixaban  . PONV (postoperative nausea and vomiting)   . Rash    "allergic to my on body tempurature , if I have fever I break out in a rash"  . Right nephrolithiasis    per CT 09-22-2018  . Wears dentures    upper  . Wears glasses     Past Surgical History:  Procedure Laterality Date  . ABDOMINAL HYSTERECTOMY  1978   ovaries remain  . BREAST CYST EXCISION Right yrs ago   benign  . CATARACT EXTRACTION W/ INTRAOCULAR LENS  IMPLANT, BILATERAL  2014  approx.  . COLONOSCOPY  01-22-2019   dr Watt Climes  . PARTIAL COLECTOMY Right 02/06/2019   Procedure: OPEN RIGHT COLECTOMY;  Surgeon: Armandina Gemma, MD;  Location: [facility];  Service: General;  Laterality: Right;    Prior to Admission medications   Medication Sig Start Date End Date Taking? Authorizing Provider  apixaban (ELIQUIS) 2.5 MG TABS tablet Take 1  tablet (2.5 mg total) by mouth 2 (two) times daily. Pt is Overdue for follow-up with MD, MUST see MD for future refills. 03/07/20  Yes Turner, Eber Hong, MD  diltiazem (DILT-XR) 180 MG 24 hr capsule Take 1 capsule (180 mg total) by mouth daily. 04/04/20  Yes Turner, Traci R, MD  RESTASIS 0.05 % ophthalmic emulsion 1 drop 2 (two) times daily. 01/25/20  Yes [provider]  vitamin C (ASCORBIC ACID) 250 MG tablet Take 1 tablet (250 mg total) by mouth daily. Patient not taking: Reported on 04/11/2020 09/23/18   Georgette Shell, MD    Scheduled Meds: . cycloSPORINE  1 drop Both Eyes BID  . [START ON 04/12/2020] diltiazem  180 mg Oral Daily   Continuous Infusions: . sodium chloride     PRN Meds:.ondansetron **OR** ondansetron (ZOFRAN) IV  Allergies as of 04/11/2020 - Review Complete 04/11/2020  Allergen Reaction Noted  . Asa [aspirin] Rash 02/19/2014  . Bentyl [dicyclomine hcl] Rash 02/19/2014  . Sucrets [dyclonine] Rash 02/19/2014  . Sulfa antibiotics Rash 02/19/2014  . Tetracycline hcl Rash 02/19/2014  . Tylenol [acetaminophen] Rash 02/19/2014    Family History  Problem Relation Age of Onset  . Hypertension Mother   . Heart Problems Mother   . Heart attack Mother   . Cancer Sister   . Cancer Brother     Social History   Socioeconomic History  . Marital status:  Married    Spouse name: Not on file  . Number of children: Not on file  . Years of education: Not on file  . Highest education level: Not on file  Occupational History  . Not on file  Tobacco Use  . Smoking status: Never Smoker  . Smokeless tobacco: Never Used  Substance and Sexual Activity  . Alcohol use: No  . Drug use: Never  . Sexual activity: Not on file  Other Topics Concern  . Not on file  Social History Narrative  . Not on file   Social Determinants of Health   Financial Resource Strain:   . Difficulty of Paying Living Expenses:   Food Insecurity:   . Worried About Charity fundraiser in  the Last Year:   . Arboriculturist in the Last Year:   Transportation Needs:   . Film/video editor (Medical):   Marland Kitchen Lack of Transportation (Non-Medical):   Physical Activity:   . Days of Exercise per Week:   . Minutes of Exercise per Session:   Stress:   . Feeling of Stress :   Social Connections:   . Frequency of Communication with Friends and Family:   . Frequency of Social Gatherings with Friends and Family:   . Attends Religious Services:   . Active Member of Clubs or Organizations:   . Attends Archivist Meetings:   Marland Kitchen Marital Status:   Intimate Partner Violence:   . Fear of Current or Ex-Partner:   . Emotionally Abused:   Marland Kitchen Physically Abused:   . Sexually Abused:     Review of Systems: All negative except as stated above in HPI.  Physical Exam: Vital signs: Vitals:   04/11/20 1200 04/11/20 1300  BP: (!) 158/82 (!) 146/72  Pulse: 87 85  Resp: 19 20  Temp:    SpO2: 100% 100%  T 98.8   General:   Alert,  Thin, elderly, pleasant and cooperative in NAD Head: normocephalic, atraumatic Eyes: anicteric sclera ENT: oropharynx clear Neck: supple, nontender Lungs:  Clear throughout to auscultation.   No wheezes, crackles, or rhonchi. No acute distress. Heart:  Regular rate and rhythm; no murmurs, clicks, rubs,  or gallops. Abdomen: soft, nontender, nondistended, +BS  Rectal:  External non-thrombosed hemorrhoids noted, minimal brown stool on gloved finger, no thrombosed internal hemorrhoids palpated, no blood seen, nontender DRE Ext: no edema  GI:  Lab Results: Recent Labs    04/11/20 1140  WBC 7.6  HGB 12.1  HCT 38.3  PLT 245   BMET Recent Labs    04/11/20 1140  NA 140  K 3.7  CL 104  CO2 26  GLUCOSE 109*  BUN 12  CREATININE 0.70  CALCIUM 9.0   LFT Recent Labs    04/11/20 1140  PROT 7.6  ALBUMIN 4.4  AST 20  ALT 14  ALKPHOS 96  BILITOT 0.7   PT/INR No results for input(s): LABPROT, INR in the last 72  hours.   Studies/Results: No results found.  Impression/Plan: Rectal bleeding in the setting of Eliquis and a recent colonoscopy. I do not think the bleeding is due to the recent polypectomy, which was very small. I think her hemorrhoids are the source. One option would be observation bed overnight since she is on the Eliquis but she really wants to go home since her husband is battling cancer. She is stable and ok to go home but advised her that if bleeding recurs she needs to come back  to the ER or call our on-call doctor. Will have office call in Anusol (generic) suppositories for her hemorrhoids. Continue Eliquis. D/W Rona Ravens.    LOS: 0 days   Lear Ng  04/11/2020, 2:53 PM  Questions please call [phone]

## 2020-04-11 NOTE — ED Notes (Signed)
Discharge paperwork reviewed with pt and pts spouse.  Pt with no questions at this time. Pt ambulatory at time of discharge.

## 2020-04-11 NOTE — ED Notes (Signed)
Pt ambulatory to bathroom, standby assistance.  

## 2020-04-11 NOTE — Discharge Instructions (Addendum)
Please follow up with your GI specialist for further management of your rectal bleeding.  Return if your bleeding persists.

## 2020-04-11 NOTE — ED Triage Notes (Signed)
Pt presents with c/o rectal bleeding that started this morning. Pt reports she does have a hx of hemorrhoids and had a colonoscopy on 4/13.

## 2020-04-11 NOTE — Progress Notes (Signed)
Patient ID: Kristen Cruz, female   DOB: 11/14/1936, 84 y.o.   MRN: [id number] I was called by ER provider to admit this patient for rectal bleeding. GI had been consulted by ER provider. I spoke with Dr. Janan Ridge who evaluated the patient and patient wants to go home. Her hemoglobin is stable and she is hemodynamically stable. She will need outpatient follow-up with PCP/GI. She will be discharged from the ER. ER provider is aware of the same.

## 2020-04-11 NOTE — Telephone Encounter (Signed)
VM on triage line from pt's spouse reporting that pt was bleeding after recent colonoscopy, pt unsure of 'where it is coming from'.  Husband states they are going to [facility] now to get checked out.  Desk RN Cloyde Reams for MD [facility]'s office also received VM from pt's spouse, states she will f/u with them.

## 2020-04-11 NOTE — Telephone Encounter (Signed)
Spoke with pt regarding recent VM. Pt states that she had her colonoscopy done on [id number] with no complications. Pt states that today she has started bleeding from her rectum "real bad". Pt states that she think it may be r/t her hemorrhoids. Pt is concerned with losing "too much blood" and was thinking about going to the ED. This RN advised pt to go to ED to be evaluated. Pt verbalizes understanding and agrees with plan of care. Pt states "I will go to the emergency room at [facility]".

## 2020-04-29 ENCOUNTER — Other Ambulatory Visit: Payer: Self-pay

## 2020-04-29 ENCOUNTER — Inpatient Hospital Stay: Payer: Medicare Other

## 2020-04-29 ENCOUNTER — Inpatient Hospital Stay: Payer: Medicare Other | Attending: Nurse Practitioner | Admitting: Oncology

## 2020-04-29 VITALS — BP 159/75 | HR 92 | Temp 98.5°F | Resp 17 | Ht 62.0 in | Wt 93.8 lb

## 2020-04-29 DIAGNOSIS — Z7901 Long term (current) use of anticoagulants: Secondary | ICD-10-CM | POA: Diagnosis not present

## 2020-04-29 DIAGNOSIS — C182 Malignant neoplasm of ascending colon: Secondary | ICD-10-CM

## 2020-04-29 DIAGNOSIS — C772 Secondary and unspecified malignant neoplasm of intra-abdominal lymph nodes: Secondary | ICD-10-CM | POA: Insufficient documentation

## 2020-04-29 DIAGNOSIS — I4891 Unspecified atrial fibrillation: Secondary | ICD-10-CM | POA: Diagnosis not present

## 2020-04-29 DIAGNOSIS — R911 Solitary pulmonary nodule: Secondary | ICD-10-CM | POA: Insufficient documentation

## 2020-04-29 LAB — CEA (IN HOUSE-CHCC): CEA (CHCC-In House): <1 ng/mL (ref 0.00–5.00)

## 2020-04-29 NOTE — Progress Notes (Signed)
  [facility] [facility] OFFICE PROGRESS NOTE   Diagnosis: Colon cancer  INTERVAL HISTORY:   Ms. Herne returns as scheduled.  She feels well.  She was seen in the emergency room 04/11/2020 with rectal bleeding.  She reports the bleeding was secondary to a hemorrhoid.  She had a colonoscopy approximately 10 days prior to the bleeding.  She was evaluated by Dr. Michail Sermon in the emergency room the bleeding was felt to be secondary to hemorrhoids.  Objective:  Vital signs in last 24 hours:  Blood pressure (!) 159/75, pulse 92, temperature 98.5 F (36.9 C), temperature source Temporal, resp. rate 17, height '5\' 2"'$  (1.575 m), weight 93 lb 12.8 oz (42.5 kg), SpO2 99 %.     Lymphatics: No cervical, supraclavicular, axillary, or inguinal nodes GI: No hepatosplenomegaly, no mass, nontender Vascular: No leg edema   Lab Results:  Lab Results  Component Value Date   WBC 7.6 04/11/2020   HGB 12.1 04/11/2020   HCT 38.3 04/11/2020   MCV 91.8 04/11/2020   PLT 245 04/11/2020   NEUTROABS 5.9 04/11/2020    CMP  Lab Results  Component Value Date   NA 140 04/11/2020   K 3.7 04/11/2020   CL 104 04/11/2020   CO2 26 04/11/2020   GLUCOSE 109 (H) 04/11/2020   BUN 12 04/11/2020   CREATININE 0.70 04/11/2020   CALCIUM 9.0 04/11/2020   PROT 7.6 04/11/2020   ALBUMIN 4.4 04/11/2020   AST 20 04/11/2020   ALT 14 04/11/2020   ALKPHOS 96 04/11/2020   BILITOT 0.7 04/11/2020   GFRNONAA >60 04/11/2020   GFRAA >60 04/11/2020    Lab Results  Component Value Date   CEA1 1.49 12/31/2019    Medications: I have reviewed the patient's current medications.   Assessment/Plan: 1. 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 4/24 lymph nodes positive, 1 tumor deposit, MSI-stable, no loss of mismatch repair protein expression ? CT abdomen/pelvis 09/22/2018-no acute finding, 10 mm right hepatic dome hypodensity-suspected cyst, nonobstructing right  nephrolithiasis ? CT chest 03/28/2019-perifissural right middle lobe nodule-potentially atelectasis or scarring ? Cycle 1 adjuvant Xeloda 03/12/2019 ? Cycle 2 adjuvant Xeloda 04/02/2019 ? Cycle 3 adjuvant Xeloda 04/23/2019 ? Cycle 4 adjuvant Xeloda 05/14/2019 ? Cycle 5 adjuvant Xeloda 06/04/2019 ? Cycle 6 adjuvant Xeloda 06/25/2019 ? Cycle 7 adjuvant Xeloda 07/16/2019  ? Cycle 8 adjuvant Xeloda 08/02/2019 ? CTs 03/24/2020-no evidence of metastatic disease, stable right middle lobe subpleural nodule 2. Postoperative ileus-resolved following NG tube decompression 3. History of iron deficiency anemia secondary to #1 4. Atrial fibrillation-maintained on apixaban 5. Family history of colon and esophagus cancer 6. 7 mm right middle lobe nodule on chest CT 03/28/2019-plan for repeat CT at a 3 to 60-monthinterval  08/23/2019 chest CT -right middle lobe nodule, smaller, flat/platelike-repeat CT 12-18 months suggested  CT chest 03/24/2020-unchanged right middle lobe subpleural nodule    Disposition: Ms. SCothronis in clinical remission from colon cancer.  She will return for an office visit and CEA in 4 months.  We will follow up on the recent colonoscopy from Dr. MWatt Climes    GBetsy Coder MD  04/29/2020  3:39 PM

## 2020-05-28 ENCOUNTER — Other Ambulatory Visit: Payer: Self-pay | Admitting: Emergency Medicine

## 2020-05-28 MED ORDER — APIXABAN 2.5 MG PO TABS: 2.5000 mg | ORAL_TABLET | Freq: Two times a day (BID) | ORAL | 5 refills | Status: DC

## 2020-05-28 NOTE — Telephone Encounter (Signed)
apixaban (ELIQUIS) 2.5 MG TABS tablet  [id number] - [place], [place] - [address] day      (.dot phrases not loaded)

## 2020-05-28 NOTE — Telephone Encounter (Signed)
Eliquis 2.5mg  refill request received. Patient is 84 years old, weight-42.5kg, Crea- 0.70 on 04/11/2020, Diagnosis-Afib, and last seen by Richardson Dopp on 03/17/2020. Dose is appropriate based on dosing criteria. Will send in refill to requested pharmacy.

## 2020-08-09 IMAGING — CT CT ABD-PELV W/ CM
2 of 5 series · 12 of 36 positions shown, 15 images · IV contrast (APPLIED)
Comparison: CT abdomen and pelvis 02/14/2019 and chest CT from
08/23/2019

CLINICAL DATA: Restaging colon cancer

EXAM:
CT CHEST, ABDOMEN, AND PELVIS WITH CONTRAST
TECHNIQUE: Multidetector CT imaging of the chest, abdomen and pelvis was
performed following the standard protocol during bolus
administration of intravenous contrast.
CONTRAST:  80mL OMNIPAQUE IOHEXOL 300 MG/ML  SOLN

[Series 2: cap with · axial · 0.66mm/px · z∈[-503,-53]mm · 9 of 114 slices shown, 12 images]
[im 12/114  mediastinal]
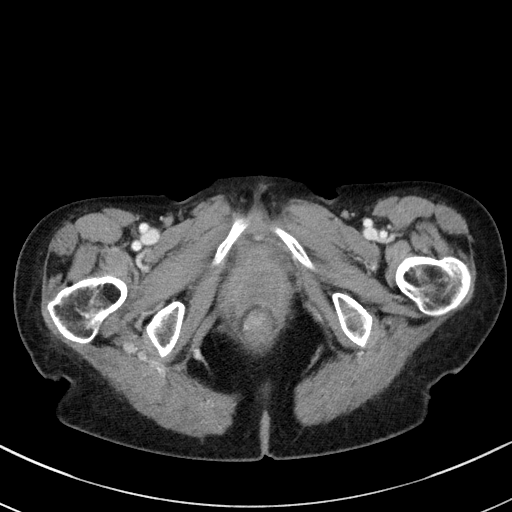
[im 12/114  lung]
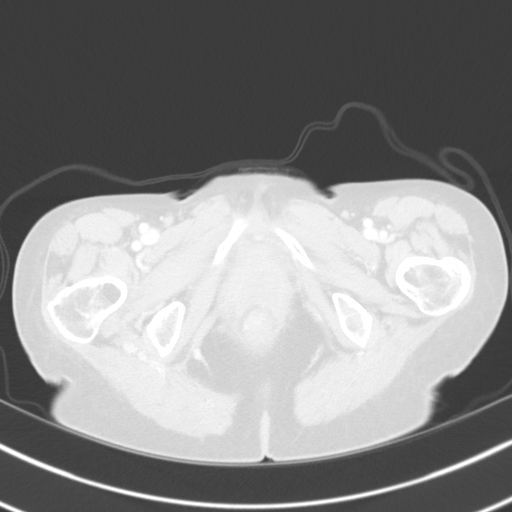
[im 23/114  lung]
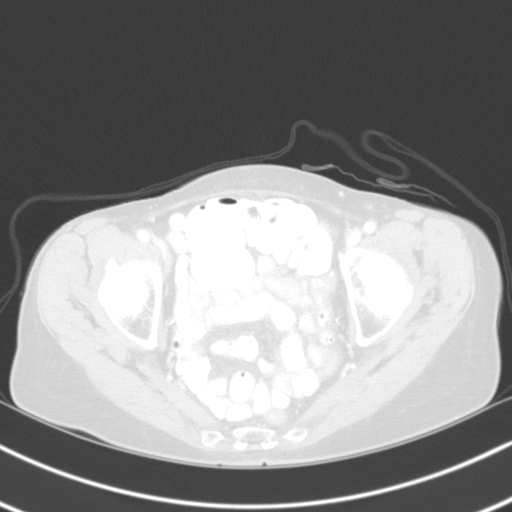
[im 34/114  lung]
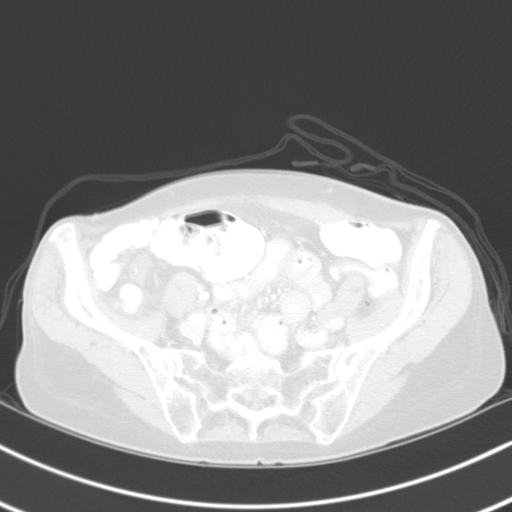
[im 46/114  lung]
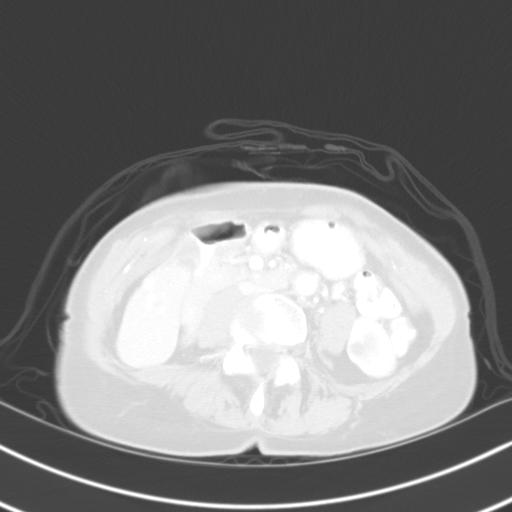
[im 57/114  mediastinal]
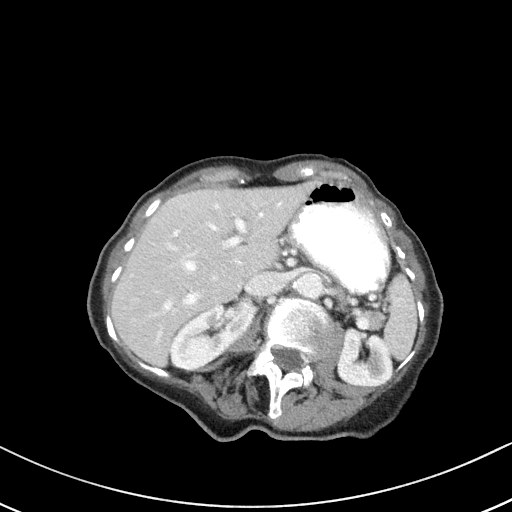
[im 57/114  lung]
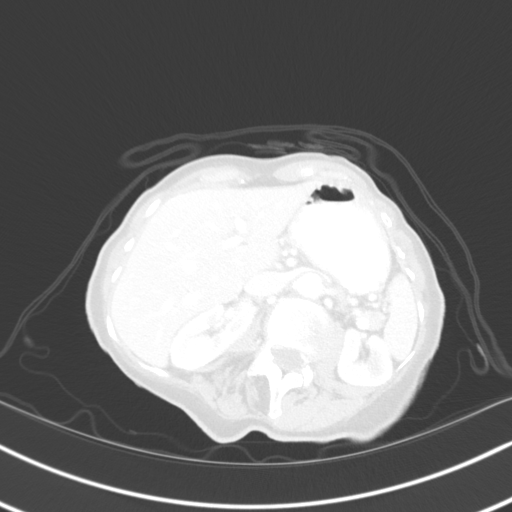
[im 68/114  lung]
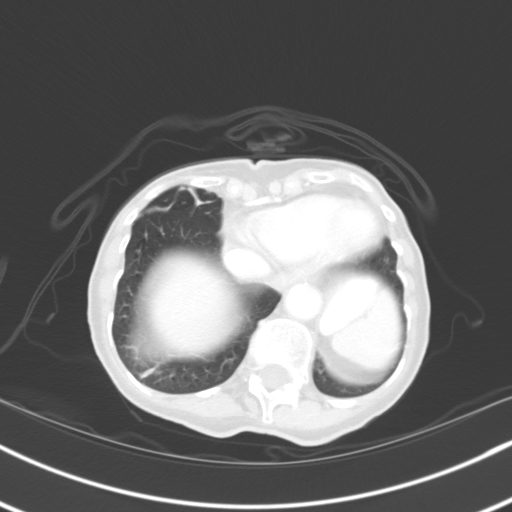
[im 80/114  lung]
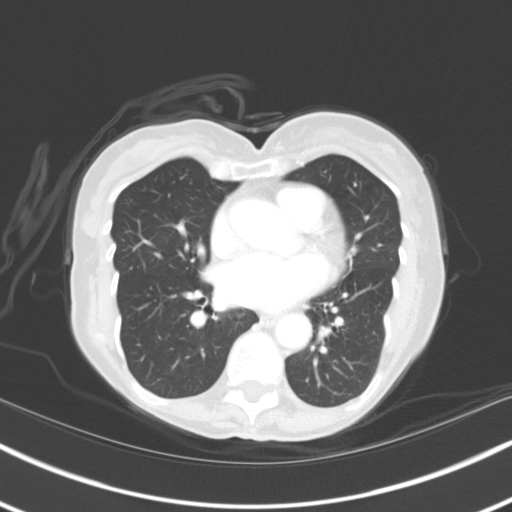
[im 91/114  lung]
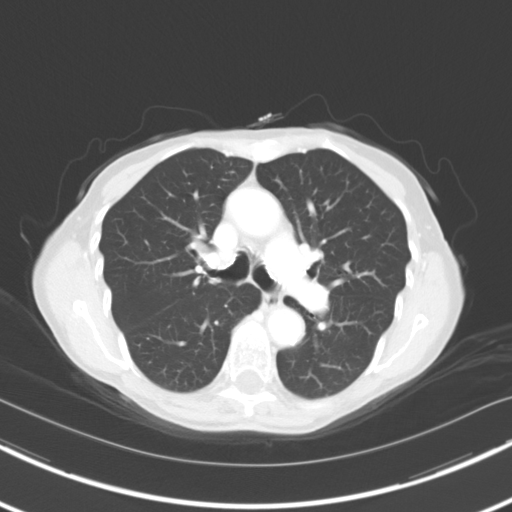
[im 102/114  mediastinal]
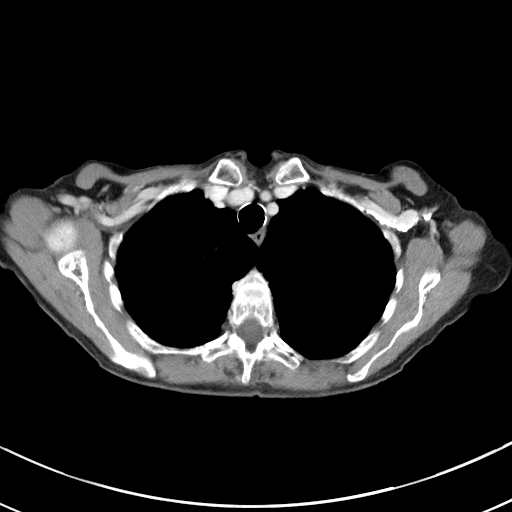
[im 102/114  lung]
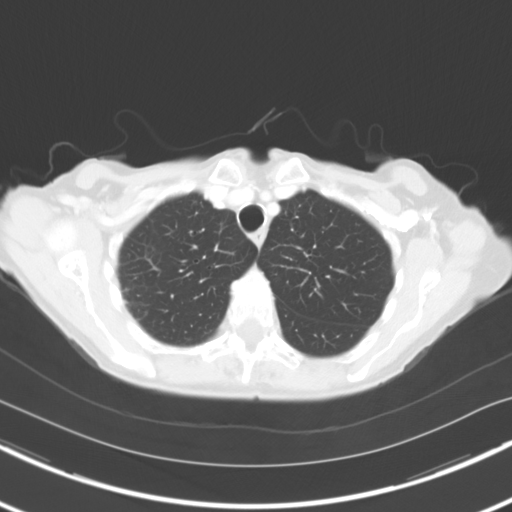

[Series 4: coronals · coronal · 0.57mm/px · 3 of 113 slices shown]
[im 23/113  lung]
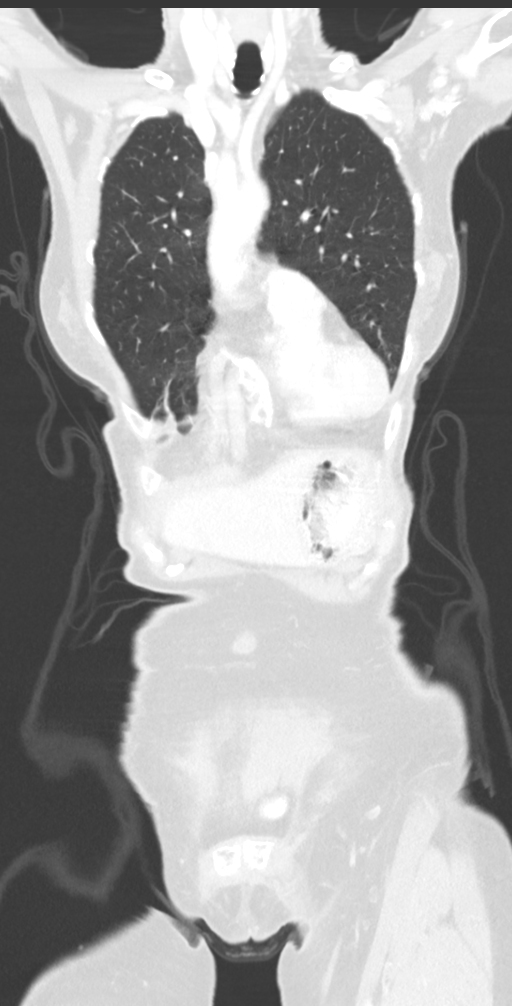
[im 45/113  lung]
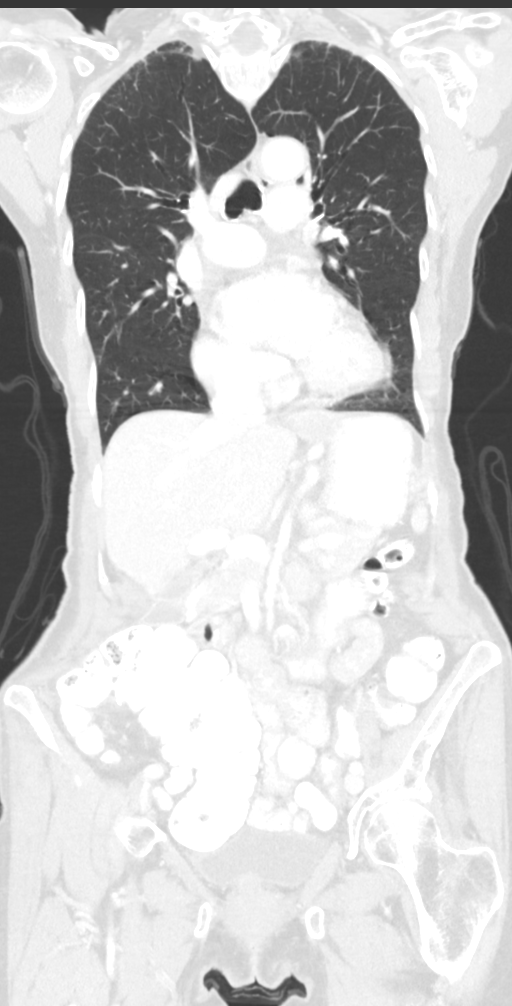
[im 68/113  lung]
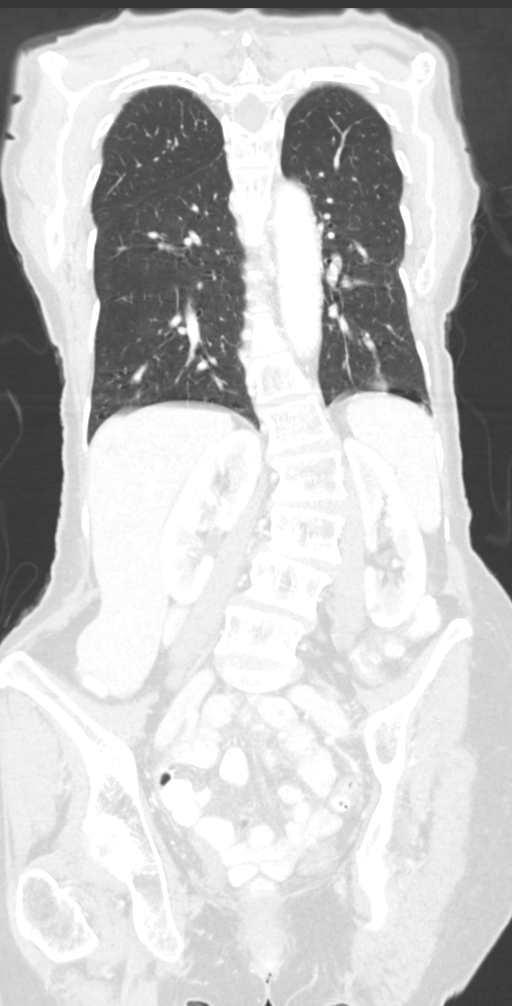

[12 of 36 positions shown; findings below may reference images not displayed]

FINDINGS: CT CHEST FINDINGS

Cardiovascular: Moderate cardiac enlargement, unchanged. No
pericardial effusion. Mild aortic atherosclerosis. Lad and coronary
artery atherosclerotic calcifications.

Mediastinum/Nodes: Normal appearance of the thyroid gland. The
trachea appears patent and is midline. Normal appearance of the
esophagus.

Lungs/Pleura: No pleural effusion. No airspace consolidation,
atelectasis, or pneumothorax. Irregular subpleural nodule in the
right middle lobe is unchanged measuring 7 mm, image 68/6. No new
lung nodules identified.

Musculoskeletal: Thoracolumbar scoliosis deformity. No suspicious
bone lesions.

CT ABDOMEN PELVIS FINDINGS

Hepatobiliary: 5 mm low-density structure in segment 7 is too small
to reliably characterize but appears from 02/14/2019. also, along
the posterolateral dome of liver is a stable 3 mm low-density
structure, image 48/2. Several tiny low-density structures within
the medial segment of left lobe of liver are stable. Also likely
small cysts. No new or suspicious liver lesions. Gallbladder is
normal.

Pancreas: Unremarkable. No pancreatic ductal dilatation or
surrounding inflammatory changes.

Spleen: Normal in size without focal abnormality.

Adrenals/Urinary Tract: Normal appearance of the adrenal glands.
Right kidney normal. Six mm low-attenuation structure arises from
inferior pole of the left kidney. Previously 6 mm. No enhancing
kidney mass or obstructive uropathy Identified. Urinary bladder
normal.

Stomach/Bowel: Stomach is within normal limits. There is no
pathologic dilatation of the small or large bowel loops. Postop
change from partial right hemicolectomy with enterocolonic
anastomosis. The previously described fluid collection adjacent to
the anastomosis measures 1.8 cm, image 80/2. Decreased from 3.5 cm
previously. Distal colon appears collapsed.

Vascular/Lymphatic: Aortic atherosclerosis. No aneurysm. No
abdominopelvic adenopathy.

Reproductive: Status post hysterectomy. No adnexal masses.

Other: No abdominal wall hernia or abnormality. No abdominopelvic
ascites.

Musculoskeletal: No acute or significant osseous findings. Bone
island noted in the right iliac wing. Similar to previous exam.
IMPRESSION: 1. Stable CT of the chest, abdomen and pelvis. No findings to
suggest recurrent tumor or metastatic disease.
2. Stable appearance of subpleural nodule in the right middle lobe
measuring 7 mm.
3. Aortic atherosclerosis and coronary artery atherosclerotic
calcifications.
4. Low-density structures within the liver are too small to reliably
characterize but are unchanged from 02/14/2019 and are favored to
represent benign cysts.

Aortic Atherosclerosis (3OLO6-YJX.X).

## 2020-08-28 ENCOUNTER — Encounter: Payer: Self-pay | Admitting: Nurse Practitioner

## 2020-08-28 ENCOUNTER — Inpatient Hospital Stay: Payer: Medicare Other

## 2020-08-28 ENCOUNTER — Telehealth: Payer: Self-pay | Admitting: Oncology

## 2020-08-28 ENCOUNTER — Inpatient Hospital Stay: Payer: Medicare Other | Attending: Nurse Practitioner | Admitting: Nurse Practitioner

## 2020-08-28 ENCOUNTER — Other Ambulatory Visit: Payer: Self-pay

## 2020-08-28 VITALS — BP 165/83 | HR 99 | Temp 97.0°F | Resp 17 | Ht 62.0 in | Wt 86.9 lb

## 2020-08-28 DIAGNOSIS — C180 Malignant neoplasm of cecum: Secondary | ICD-10-CM | POA: Insufficient documentation

## 2020-08-28 DIAGNOSIS — R911 Solitary pulmonary nodule: Secondary | ICD-10-CM | POA: Insufficient documentation

## 2020-08-28 DIAGNOSIS — Z7901 Long term (current) use of anticoagulants: Secondary | ICD-10-CM | POA: Diagnosis not present

## 2020-08-28 DIAGNOSIS — I4891 Unspecified atrial fibrillation: Secondary | ICD-10-CM | POA: Insufficient documentation

## 2020-08-28 DIAGNOSIS — C182 Malignant neoplasm of ascending colon: Secondary | ICD-10-CM

## 2020-08-28 DIAGNOSIS — R634 Abnormal weight loss: Secondary | ICD-10-CM | POA: Diagnosis not present

## 2020-08-28 LAB — CEA (IN HOUSE-CHCC): CEA (CHCC-In House): 1.05 ng/mL (ref 0.00–5.00)

## 2020-08-28 NOTE — Progress Notes (Signed)
  [facility] OFFICE PROGRESS NOTE   Diagnosis: Colon cancer  INTERVAL HISTORY:   Ms. Mcneff returns as scheduled.  No change in bowel habits.  No abdominal pain.  She reports being up-to-date on surveillance colonoscopy.  Her husband died last month.  She lost weight while she was caring for him.  She thinks her appetite is beginning to improve.  Objective:  Vital signs in last 24 hours:  Blood pressure (!) 165/83, pulse 99, temperature (!) 97 F (36.1 C), temperature source Tympanic, resp. rate 17, height 5' 2" (1.575 m), weight 86 lb 14.4 oz (39.4 kg), SpO2 99 %.    HEENT: Neck without mass. Lymphatics: No palpable cervical, supraclavicular, axillary or inguinal lymph nodes. Resp: Lungs clear bilaterally. Cardio: Regular rate and rhythm. GI: Abdomen soft and nontender.  No hepatomegaly. Vascular: No leg edema.    Lab Results:  Lab Results  Component Value Date   WBC 7.6 04/11/2020   HGB 12.1 04/11/2020   HCT 38.3 04/11/2020   MCV 91.8 04/11/2020   PLT 245 04/11/2020   NEUTROABS 5.9 04/11/2020    Imaging:  No results found.  Medications: I have reviewed the patient's current medications.  Assessment/Plan: 1. 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 4/24 lymph nodes positive, 1 tumor deposit, MSI-stable, no loss of mismatch repair protein expression ? CT abdomen/pelvis 09/22/2018-no acute finding, 10 mm right hepatic dome hypodensity-suspected cyst, nonobstructing right nephrolithiasis ? CT chest 03/28/2019-perifissural right middle lobe nodule-potentially atelectasis or scarring ? Cycle 1 adjuvant Xeloda 03/12/2019 ? Cycle 2 adjuvant Xeloda 04/02/2019 ? Cycle 3 adjuvant Xeloda 04/23/2019 ? Cycle 4 adjuvant Xeloda 05/14/2019 ? Cycle 5 adjuvant Xeloda 06/04/2019 ? Cycle 6 adjuvant Xeloda 06/25/2019 ? Cycle 7 adjuvant Xeloda 07/16/2019 ? Cycle 8 adjuvant Xeloda 08/02/2019 ? CTs 03/24/2020-no evidence of metastatic  disease, stable right middle lobe subpleural nodule ? Colonoscopy 04/01/2020-external and internal hemorrhoids.  Diverticulosis in the sigmoid colon.  1 diminutive polyp in the mid ascending colon (biopsy-hyperplastic polyp with features of mucosal prolapse).  Patent end to side ileocolonic anastomosis. 2. Postoperative ileus-resolved following NG tube decompression 3. History of iron deficiency anemia secondary to #1 4. Atrial fibrillation-maintained on apixaban 5. Family history of colon and esophagus cancer 6. 7 mm right middle lobe nodule on chest CT 03/28/2019-plan for repeat CT at a 3 to 70-monthinterval  08/23/2019 chest CT -right middle lobe nodule, smaller, flat/platelike-repeat CT 12-18 months suggested  CT chest 03/24/2020-unchanged right middle lobe subpleural nodule  Disposition: Ms. SWestallremains in clinical remission from colon cancer.  We will follow-up on the CEA from today.  She has lost weight since her last visit.  She attributes this to the recent death of her husband.  She feels her appetite is beginning to improve.  She will contact the office with continued weight loss.  We will see her back in 4 months.  She will contact the office in the interim as outlined above or with any other problems.    LNed CardANP/GNP-BC   08/28/2020  10:55 AM

## 2020-08-28 NOTE — Telephone Encounter (Signed)
Scheduled appointment per 9/9 los. Patient is aware of appointment and I gave her a calendar print out.

## 2020-08-29 ENCOUNTER — Telehealth: Payer: Self-pay | Admitting: *Deleted

## 2020-08-29 NOTE — Telephone Encounter (Signed)
-----   Message from [name], NP sent at 08/29/2020  3:01 PM EDT ----- Please let her know CEA is stable in normal range.  Follow-up as scheduled.

## 2020-08-29 NOTE — Telephone Encounter (Signed)
Attempted to call w/CEA results. No answer. Will call back Monday.

## 2020-09-01 NOTE — Telephone Encounter (Signed)
Notified of normal CEA. 

## 2020-09-03 ENCOUNTER — Encounter: Payer: Self-pay | Admitting: General Practice

## 2020-09-03 NOTE — Progress Notes (Signed)
[facility] CSW Progress Notes  Request from Basilia Jumbo to call patient as she expressed sadness related to the recent loss of her husband.  Called patient, she reports her husband died 08/07/2023.  Were married 32 years.  Reports she has good support from children who live locally, sister in [place] and "the good Lord."  She states she has understandable and normal grief and "misses him terribly" but overall feels she is "in good shape."  States that her weight loss was due to "running to the hospital when he was so sick", is now eating well.  Expressed thanks for the call and knows to reach out for support in future if needed.  Edwyna Shell, LCSW Clinical Social Worker Phone:  [phone]

## 2020-10-02 ENCOUNTER — Ambulatory Visit: Payer: Medicare Other | Admitting: Cardiology

## 2020-10-10 ENCOUNTER — Other Ambulatory Visit: Payer: Self-pay | Admitting: Cardiology

## 2020-11-12 ENCOUNTER — Other Ambulatory Visit: Payer: Self-pay

## 2020-11-12 ENCOUNTER — Encounter: Payer: Self-pay | Admitting: Cardiology

## 2020-11-12 ENCOUNTER — Ambulatory Visit (INDEPENDENT_AMBULATORY_CARE_PROVIDER_SITE_OTHER): Payer: Medicare Other | Admitting: Cardiology

## 2020-11-12 VITALS — BP 150/68 | HR 114 | Ht 62.0 in | Wt 85.6 lb

## 2020-11-12 DIAGNOSIS — I480 Paroxysmal atrial fibrillation: Secondary | ICD-10-CM

## 2020-11-12 DIAGNOSIS — I10 Essential (primary) hypertension: Secondary | ICD-10-CM | POA: Diagnosis not present

## 2020-11-12 LAB — CBC

## 2020-11-12 NOTE — Patient Instructions (Signed)
Medication Instructions:  Your physician recommends that you continue on your current medications as directed. Please refer to the Current Medication list given to you today.  *If you need a refill on your cardiac medications before your next appointment, please call your pharmacy*   Lab Work: TODAY: CBC, BMET If you have labs (blood work) drawn today and your tests are completely normal, you will receive your results only by: Marland Kitchen MyChart Message (if you have MyChart) OR . A paper copy in the mail If you have any lab test that is abnormal or we need to change your treatment, we will call you to review the results.   Testing/Procedures: Your physician has requested that you have an echocardiogram. Echocardiography is a painless test that uses sound waves to create images of your heart. It provides your doctor with information about the size and shape of your heart and how well your heart's chambers and valves are working. This procedure takes approximately one hour. There are no restrictions for this procedure.   Follow-Up: At [facility], you and your health needs are our priority.  As part of our continuing mission to provide you with exceptional heart care, we have created designated Provider Care Teams.  These Care Teams include your primary Cardiologist (physician) and Advanced Practice Providers (APPs -  Physician Assistants and Nurse Practitioners) who all work together to provide you with the care you need, when you need it.  Your next appointment:   1 year(s)  The format for your next appointment:   In Person  Provider:   You may see Fransico Him, MD or one of the following Advanced Practice Providers on your designated Care Team:    Melina Copa, PA-C  Ermalinda Barrios, PA-C

## 2020-11-12 NOTE — Progress Notes (Addendum)
Cardiology Office Note:    Date:  11/12/2020   ID:  [name], DOB 1936-01-27, MRN [id number]  PCP:  Eulas Post, MD  Cardiologist:  Fransico Him, MD   Electrophysiologist:  None   Referring MD: Eulas Post, MD   Chief Complaint:  Atrial Fibrillation and Hypertension    Patient Profile:    Kristen Cruz is a 84 y.o. female with:   Paroxysmal atrial fibrillation   CHA2DS2-VASc=4 (female, age x 2, >> Apixaban)  Hypertension   Coronary Ca noted on CT (08/2019)  Colon CA s/p R colectomy in 01/2019  Fe deficiency anemia  Prior CV studies: Echocardiogram 03/08/14 EF 55-65, no RWMA  History of Present Illness:    Ms. Anglin is here today for followup and is doing well.  She had colon CA 2 years and and completed chemo with resection.  She denies any chest pain or pressure, SOB, DOE, PND, orthopnea, LE edema, dizziness, palpitations or syncope. She is having a lot of issues with anxiety and stress as she recently lost her husband.  She is compliant with her meds and is tolerating meds with no SE.    Past Medical History:  Diagnosis Date  . Anticoagulant long-term use    eliquis  . Arthritis   . Cecum mass   . History of GI bleed 09/22/2018   upper  . HOH (hard of hearing)   . Hypertension   . IDA (iron deficiency anemia)   . PAF (paroxysmal atrial fibrillation) [facility]) cardiologist-- dr Radford Pax   CHADS2VASC score is 4 on Apixaban  . PONV (postoperative nausea and vomiting)   . Rash    "allergic to my on body tempurature , if I have fever I break out in a rash"  . Right nephrolithiasis    per CT 09-22-2018  . Wears dentures    upper  . Wears glasses     Current Medications: Current Meds  Medication Sig  . apixaban (ELIQUIS) 2.5 MG TABS tablet Take 1 tablet (2.5 mg total) by mouth 2 (two) times daily.  . Cyanocobalamin (VITAMIN B-12 PO) Take 1 capsule by mouth daily.  Marland Kitchen DILT-XR 180 MG 24 hr capsule TAKE 1 CAPSULE BY MOUTH EVERY DAY  .  RESTASIS 0.05 % ophthalmic emulsion 1 drop 2 (two) times daily.  . vitamin C (ASCORBIC ACID) 250 MG tablet Take 1 tablet (250 mg total) by mouth daily.     Allergies:   Asa [aspirin], Bentyl [dicyclomine hcl], Sucrets [dyclonine], Sulfa antibiotics, Tetracycline hcl, and Tylenol [acetaminophen]   Social History   Tobacco Use  . Smoking status: Never Smoker  . Smokeless tobacco: Never Used  Vaping Use  . Vaping Use: Never used  Substance Use Topics  . Alcohol use: No  . Drug use: Never     Family Hx: The patient's family history includes Cancer in her brother and sister; Heart Problems in her mother; Heart attack in her mother; Hypertension in her mother.  ROS   EKGs/Labs/Other Test Reviewed:    EKG:  EKG is   ordered today.  The ekg ordered today demonstrates normal sinus rhythm, heart rate 90, left axis deviation, incomplete right bundle branch block, QTC 392, no change from prior tracing  Recent Labs: 04/11/2020: ALT 14; BUN 12; Creatinine, Ser 0.70; Hemoglobin 12.1; Platelets 245; Potassium 3.7; Sodium 140   Recent Lipid Panel No results found for: CHOL, TRIG, HDL, CHOLHDL, LDLCALC, LDLDIRECT  Physical Exam:    VS:  BP (!) 150/68  Pulse (!) 114   Ht 5\' 2"  (1.575 m)   Wt 85 lb 9.6 oz (38.8 kg)   SpO2 97%   BMI 15.66 kg/m     Wt Readings from Last 3 Encounters:  11/12/20 85 lb 9.6 oz (38.8 kg)  08/28/20 86 lb 14.4 oz (39.4 kg)  04/29/20 93 lb 12.8 oz (42.5 kg)    GEN: Well nourished, well developed in no acute distress HEENT: Normal NECK: No JVD; No carotid bruits LYMPHATICS: No lymphadenopathy CARDIAC:regular and tachy, no murmurs, rubs, gallops RESPIRATORY:  Clear to auscultation without rales, wheezing or rhonchi  ABDOMEN: Soft, non-tender, non-distended MUSCULOSKELETAL:  No edema; No deformity  SKIN: Warm and dry NEUROLOGIC:  Alert and oriented x 3 PSYCHIATRIC:  Normal affect     EKG was done in office today and showed sinus tachycardia with rSR' in  V1  ASSESSMENT & PLAN:    1.  Paroxysmal atrial fibrillation (HCC) -continue to maintain NSR on exam -denies any bleeding problems on DOAC -SCr was 0.7 in April 2021 -check BMET and CBC today -continue apixaban at a reduced dose of 2.5mg  BID due to age > 68 and wt < 60kg -continue Cardizem XR 180mg  daily  2. Essential hypertension, benign -BP is borderline controlled on exam today -continue CCB  3.  Tachycardia -she is very anxious and stressed because she just lost her husband and this office brings back memories of him -EKG showed STach -will check a 2D echo since she has finished chemo in the past year for colon CA to make sure LVF is preserved  Dispo: followup 1 year  Medication Adjustments/Labs and Tests Ordered: Current medicines are reviewed at length with the patient today.  Concerns regarding medicines are outlined above.  Tests Ordered: No orders of the defined types were placed in this encounter.  Medication Changes: No orders of the defined types were placed in this encounter.   Signed, Fransico Him, MD  11/12/2020 11:10 AM    [address], [place], [place]  [address] Phone: [phone]; Fax: [phone]

## 2020-11-12 NOTE — Addendum Note (Signed)
Addended by: Antonieta Iba on: 11/12/2020 11:43 AM   Modules accepted: Orders

## 2020-11-13 LAB — BASIC METABOLIC PANEL
BUN/Creatinine Ratio: 18 (ref 12–28)
BUN: 13 mg/dL (ref 8–27)
CO2: 27 mmol/L (ref 20–29)
Calcium: 9.6 mg/dL (ref 8.7–10.3)
Chloride: 101 mmol/L (ref 96–106)
Creatinine, Ser: 0.71 mg/dL (ref 0.57–1.00)
GFR calc Af Amer: 92 mL/min/{1.73_m2} (ref >59)
GFR calc non Af Amer: 80 mL/min/{1.73_m2} (ref >59)
Glucose: 108 mg/dL — ABNORMAL HIGH (ref 65–99)
Potassium: 4.4 mmol/L (ref 3.5–5.2)
Sodium: 139 mmol/L (ref 134–144)

## 2020-11-13 LAB — CBC
Hematocrit: 39.7 % (ref 34.0–46.6)
Hemoglobin: 12.6 g/dL (ref 11.1–15.9)
MCH: 29.7 pg (ref 26.6–33.0)
MCHC: 31.7 g/dL (ref 31.5–35.7)
MCV: 94 fL (ref 79–97)
Platelets: 281 10*3/uL (ref 150–450)
RBC: 4.24 x10E6/uL (ref 3.77–5.28)
RDW: 12.7 % (ref 11.7–15.4)
WBC: 8 10*3/uL (ref 3.4–10.8)

## 2020-11-28 ENCOUNTER — Other Ambulatory Visit: Payer: Self-pay | Admitting: Cardiology

## 2020-11-28 NOTE — Telephone Encounter (Signed)
Prescription refill request for Eliquis received.  Hx: Afib Last office visit: [place], 11/12/2020 Scr: 0.71, 11/12/2020 Age: 84 yo Weight: 38.8 kg   Prescription refill sent.

## 2020-12-25 ENCOUNTER — Telehealth: Payer: Self-pay | Admitting: Pharmacist

## 2020-12-25 NOTE — Telephone Encounter (Signed)
Received approval for Eliquis through 12/25/21, pt is aware and was appreciative for assistance.

## 2020-12-25 NOTE — Telephone Encounter (Signed)
Called pt to obtain 2022 prescription drug insurance to submit Eliquis prior authorization.   [facility] [id number] BIN [id number] PCN [id number]  Prior authorization has been sent.

## 2020-12-29 ENCOUNTER — Other Ambulatory Visit: Payer: Self-pay

## 2020-12-29 ENCOUNTER — Telehealth: Payer: Self-pay | Admitting: Oncology

## 2020-12-29 ENCOUNTER — Inpatient Hospital Stay (HOSPITAL_BASED_OUTPATIENT_CLINIC_OR_DEPARTMENT_OTHER): Payer: Medicare Other | Admitting: Oncology

## 2020-12-29 ENCOUNTER — Inpatient Hospital Stay: Payer: Medicare Other | Attending: Oncology

## 2020-12-29 VITALS — BP 142/84 | HR 106 | Temp 97.0°F | Resp 15 | Ht 62.0 in | Wt 85.6 lb

## 2020-12-29 DIAGNOSIS — C180 Malignant neoplasm of cecum: Secondary | ICD-10-CM | POA: Insufficient documentation

## 2020-12-29 DIAGNOSIS — R911 Solitary pulmonary nodule: Secondary | ICD-10-CM | POA: Diagnosis not present

## 2020-12-29 DIAGNOSIS — C182 Malignant neoplasm of ascending colon: Secondary | ICD-10-CM | POA: Diagnosis not present

## 2020-12-29 LAB — CEA (IN HOUSE-CHCC): CEA (CHCC-In House): 1.58 ng/mL (ref 0.00–5.00)

## 2020-12-29 NOTE — Progress Notes (Signed)
  [facility] OFFICE PROGRESS NOTE   Diagnosis: Colon cancer  INTERVAL HISTORY:   Kristen Cruz returns as scheduled.  She feels well.  She reports a good appetite.  No pain.  No new complaint.  She is scheduled for an echocardiogram next week.  Objective:  Vital signs in last 24 hours:  Blood pressure (!) 142/84, pulse (!) 106, temperature (!) 97 F (36.1 C), temperature source Tympanic, resp. rate 15, height _0  (1.575 m), weight 85 lb 9.6 oz (38.8 kg), SpO2 100 %.    HEENT: Neck without mass Lymphatics: No cervical, supraclavicular, axillary, or inguinal nodes Resp: Lungs clear bilaterally Cardio: Regular rhythm, tachycardia GI: No mass, nontender, no hepatosplenomegaly Vascular: No leg edema   Lab Results:   Lab Results  Component Value Date   CEA1 1.05 08/28/2020    Medications: I have reviewed the patient's current medications.   Assessment/Plan: 1. 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 4/24 lymph nodes positive, 1 tumor deposit, MSI-stable, no loss of mismatch repair protein expression ? CT abdomen/pelvis 09/22/2018-no acute finding, 10 mm right hepatic dome hypodensity-suspected cyst, nonobstructing right nephrolithiasis ? CT chest 03/28/2019-perifissural right middle lobe nodule-potentially atelectasis or scarring ? Cycle 1 adjuvant Xeloda 03/12/2019 ? Cycle 2 adjuvant Xeloda 04/02/2019 ? Cycle 3 adjuvant Xeloda 04/23/2019 ? Cycle 4 adjuvant Xeloda 05/14/2019 ? Cycle 5 adjuvant Xeloda 06/04/2019 ? Cycle 6 adjuvant Xeloda 06/25/2019 ? Cycle 7 adjuvant Xeloda 07/16/2019  ? Cycle 8 adjuvant Xeloda 08/02/2019 ? CTs 03/24/2020-no evidence of metastatic disease, stable right middle lobe subpleural nodule 2. Postoperative ileus-resolved following NG tube decompression 3. History of iron deficiency anemia secondary to #1 4. Atrial fibrillation-maintained on apixaban 5. Family history of colon and esophagus cancer 6. 7  mm right middle lobe nodule on chest CT 03/28/2019-plan for repeat CT at a 3 to 63-monthinterval  08/23/2019 chest CT -right middle lobe nodule, smaller, flat/platelike-repeat CT 12-18 months suggested  CT chest 03/24/2020-unchanged right middle lobe subpleural nodule  Disposition: Kristen Cruz in clinical remission from colon cancer.  We will follow up on the CEA from today.  She will return for an office visit and restaging CTs in April.  She reports a good appetite, but she has not gained weight.  I encouraged her to attempt to gain weight.  We recommended the COVID-19 booster vaccine.  She did not wish to receive the vaccine today.  GBetsy Coder MD  12/29/2020  10:51 AM

## 2020-12-29 NOTE — Telephone Encounter (Signed)
Scheduled appointments per 1/10 los. Spoke to patient who is aware of appointments dates and times. Gave patient calendar print out.  

## 2020-12-30 ENCOUNTER — Telehealth: Payer: Self-pay

## 2020-12-30 NOTE — Telephone Encounter (Signed)
Spoke with patient made her aware of CEA results

## 2020-12-30 NOTE — Telephone Encounter (Signed)
-----   Message from Ladell Pier, MD sent at 12/29/2020  2:01 PM EST ----- Please call patient, the CEA is normal, follow-up as scheduled

## 2021-01-05 ENCOUNTER — Other Ambulatory Visit (HOSPITAL_COMMUNITY): Payer: Medicare Other

## 2021-01-15 ENCOUNTER — Other Ambulatory Visit: Payer: Self-pay

## 2021-01-15 ENCOUNTER — Ambulatory Visit (HOSPITAL_COMMUNITY): Payer: Medicare Other | Attending: Internal Medicine

## 2021-01-15 DIAGNOSIS — I10 Essential (primary) hypertension: Secondary | ICD-10-CM | POA: Diagnosis not present

## 2021-01-15 DIAGNOSIS — I480 Paroxysmal atrial fibrillation: Secondary | ICD-10-CM | POA: Diagnosis not present

## 2021-01-15 LAB — ECHOCARDIOGRAM COMPLETE
Area-P 1/2: 4.15 cm2
S' Lateral: 2.5 cm

## 2021-02-09 NOTE — Progress Notes (Signed)
Subjective:   Kristen Cruz is a 85 y.o. female who presents for an Initial Medicare Annual Wellness Visit.  I connected with [name] today by telephone and verified that I am speaking with the correct person using two identifiers. Location patient: home Location provider: work Persons participating in the virtual visit: patient, provider.   I discussed the limitations, risks, security and privacy concerns of performing an evaluation and management service by telephone and the availability of in person appointments. I also discussed with the patient that there may be a patient responsible charge related to this service. The patient expressed understanding and verbally consented to this telephonic visit.    Interactive audio and video telecommunications were attempted between this provider and patient, however failed, due to patient having technical difficulties OR patient did not have access to video capability.  We continued and completed visit with audio only.      Review of Systems    N/A  Cardiac Risk Factors include: advanced age (>4men, >1 women);hypertension     Objective:    Today's Vitals   There is no height or weight on file to calculate BMI.  Advanced Directives 02/10/2021 12/29/2020 04/29/2020 04/11/2020 12/31/2019 06/21/2019 02/14/2019  Does Patient Have a Medical Advance Directive? Yes Yes Yes Yes Yes No No;Yes  Type of Paramedic of [place];Living will [place];Living will [place];Living will [place];Living will [place];Living will - [place]  Does patient want to make changes to medical advance directive? No - Patient declined No - Patient declined No - Patient declined - No - Patient declined - No - Patient declined  Copy of [place] in Chart? No - copy requested No - copy requested No - copy requested - No -  copy requested - No - copy requested  Would patient like information on creating a medical advance directive? - - - - - No - Patient declined -    Current Medications (verified) Outpatient Encounter Medications as of 02/10/2021  Medication Sig  . Cyanocobalamin (VITAMIN B-12 PO) Take 1 capsule by mouth daily.  Marland Kitchen DILT-XR 180 MG 24 hr capsule TAKE 1 CAPSULE BY MOUTH EVERY DAY  . ELIQUIS 2.5 MG TABS tablet TAKE 1 TABLET BY MOUTH TWICE A DAY  . RESTASIS 0.05 % ophthalmic emulsion 1 drop 2 (two) times daily.  . vitamin C (ASCORBIC ACID) 250 MG tablet Take 1 tablet (250 mg total) by mouth daily.   No facility-administered encounter medications on file as of 02/10/2021.    Allergies (verified) Asa [aspirin], Bentyl [dicyclomine hcl], Sucrets [dyclonine], Sulfa antibiotics, Tetracycline hcl, and Tylenol [acetaminophen]   History: Past Medical History:  Diagnosis Date  . Anticoagulant long-term use    eliquis  . Arthritis   . Cecum mass   . History of GI bleed 09/22/2018   upper  . HOH (hard of hearing)   . Hypertension   . IDA (iron deficiency anemia)   . PAF (paroxysmal atrial fibrillation) [facility]) cardiologist-- dr Radford Pax   CHADS2VASC score is 4 on Apixaban  . PONV (postoperative nausea and vomiting)   . Rash    "allergic to my on body tempurature , if I have fever I break out in a rash"  . Right nephrolithiasis    per CT 09-22-2018  . Wears dentures    upper  . Wears glasses    Past Surgical History:  Procedure Laterality Date  .  ABDOMINAL HYSTERECTOMY  1978   ovaries remain  . BREAST CYST EXCISION Right yrs ago   benign  . CATARACT EXTRACTION W/ INTRAOCULAR LENS  IMPLANT, BILATERAL  2014  approx.  . COLONOSCOPY  01-22-2019   dr Watt Climes  . PARTIAL COLECTOMY Right 02/06/2019   Procedure: OPEN RIGHT COLECTOMY;  Surgeon: Armandina Gemma, MD;  Location: [facility]S;  Service: General;  Laterality: Right;   Family History  Problem Relation Age of Onset  . Hypertension Mother   . Heart  Problems Mother   . Heart attack Mother   . Cancer Sister   . Cancer Brother    Social History   Socioeconomic History  . Marital status: Married    Spouse name: Not on file  . Number of children: Not on file  . Years of education: Not on file  . Highest education level: Not on file  Occupational History  . Not on file  Tobacco Use  . Smoking status: Never Smoker  . Smokeless tobacco: Never Used  Vaping Use  . Vaping Use: Never used  Substance and Sexual Activity  . Alcohol use: No  . Drug use: Never  . Sexual activity: Not on file  Other Topics Concern  . Not on file  Social History Narrative  . Not on file   Social Determinants of Health   Financial Resource Strain: Low Risk   . Difficulty of Paying Living Expenses: Not hard at all  Food Insecurity: No Food Insecurity  . Worried About Charity fundraiser in the Last Year: Never true  . Ran Out of Food in the Last Year: Never true  Transportation Needs: No Transportation Needs  . Lack of Transportation (Medical): No  . Lack of Transportation (Non-Medical): No  Physical Activity: Sufficiently Active  . Days of Exercise per Week: 3 days  . Minutes of Exercise per Session: 60 min  Stress: No Stress Concern Present  . Feeling of Stress : Not at all  Social Connections: Socially Isolated  . Frequency of Communication with Friends and Family: More than three times a week  . Frequency of Social Gatherings with Friends and Family: Twice a week  . Attends Religious Services: Never  . Active Member of Clubs or Organizations: No  . Attends Archivist Meetings: Never  . Marital Status: Widowed    Tobacco Counseling Counseling given: Not Answered   Clinical Intake:  Pre-visit preparation completed: Yes  Pain : No/denies pain     Nutritional Risks: None Diabetes: No  How often do you need to have someone help you when you read instructions, pamphlets, or other written materials from your doctor or  pharmacy?: 1 - Never  Diabetic?No   Interpreter Needed?: No  Information entered by :: [place] of Daily Living In your present state of health, do you have any difficulty performing the following activities: 02/10/2021  Hearing? Y  Comment has bilateral hearing aids  Vision? N  Difficulty concentrating or making decisions? N  Walking or climbing stairs? N  Dressing or bathing? N  Doing errands, shopping? N  Preparing Food and eating ? N  Using the Toilet? N  In the past six months, have you accidently leaked urine? N  Do you have problems with loss of bowel control? N  Managing your Medications? N  Managing your Finances? N  Housekeeping or managing your Housekeeping? N  Some recent data might be hidden    Patient Care Team: Eulas Post, MD  as PCP - General (Family Medicine) Sueanne Margarita, MD as PCP - Cardiology (Cardiology) Armandina Gemma, MD as Consulting Physician (General Surgery) Clarene Essex, MD as Consulting Physician (Gastroenterology) Ladell Pier, MD as Consulting Physician (Oncology)  Indicate any recent Medical Services you may have received from other than [facility] providers in the past year (date may be approximate).     Assessment:   This is a routine wellness examination for Kristen Cruz.  Hearing/Vision screen  Hearing Screening   125Hz  250Hz  500Hz  1000Hz  2000Hz  3000Hz  4000Hz  6000Hz  8000Hz   Right ear:           Left ear:           Vision Screening Comments: Gets eyes examined once per year. Wears glasses    Dietary issues and exercise activities discussed: Current Exercise Habits: Home exercise routine, Type of exercise: stretching, Time (Minutes): 60, Frequency (Times/Week): 3, Weekly Exercise (Minutes/Week): 180, Intensity: Moderate  Goals    . Patient Stated     I will continue to stretch 3x per week       Depression Screen PHQ 2/9 Scores 02/10/2021 12/15/2018  PHQ - 2 Score 1 0  PHQ- 9 Score 1 0    Fall Risk Fall  Risk  02/10/2021 07/19/2019 12/15/2018  Falls in the past year? 1 0 0  Comment slipped on and item Emmi Telephone Survey: data to providers prior to load -  Number falls in past yr: 0 - -  Injury with Fall? 0 - -  Risk for fall due to : No Fall Risks - -  Follow up Falls evaluation completed;Falls prevention discussed - -    FALL RISK PREVENTION PERTAINING TO THE HOME:  Any stairs in or around the home? Yes  If so, are there any without handrails? No  Home free of loose throw rugs in walkways, pet beds, electrical cords, etc? Yes  Adequate lighting in your home to reduce risk of falls? Yes   ASSISTIVE DEVICES UTILIZED TO PREVENT FALLS:  Life alert? No  Use of a cane, walker or w/c? No  Grab bars in the bathroom? Yes  Shower chair or bench in shower? No  Elevated toilet seat or a handicapped toilet? No     Cognitive Function: Normal cognitive status assessed by direct observation by this Nurse Health Advisor. No abnormalities found.          Immunizations Immunization History  Administered Date(s) Administered  . PFIZER(Purple Top)SARS-COV-2 Vaccination 02/29/2020, 03/21/2020    TDAP status: Due, Education has been provided regarding the importance of this vaccine. Advised may receive this vaccine at local pharmacy or Health Dept. Aware to provide a copy of the vaccination record if obtained from local pharmacy or Health Dept. Verbalized acceptance and understanding.  Flu Vaccine status: Due, Education has been provided regarding the importance of this vaccine. Advised may receive this vaccine at local pharmacy or Health Dept. Aware to provide a copy of the vaccination record if obtained from local pharmacy or Health Dept. Verbalized acceptance and understanding.  Pneumococcal vaccine status: Due, Education has been provided regarding the importance of this vaccine. Advised may receive this vaccine at local pharmacy or Health Dept. Aware to provide a copy of the vaccination  record if obtained from local pharmacy or Health Dept. Verbalized acceptance and understanding.  Covid-19 vaccine status: Completed vaccines  Qualifies for Shingles Vaccine? Yes   Zostavax completed No   Shingrix Completed?: No.    Education has been provided regarding the importance  of this vaccine. Patient has been advised to call insurance company to determine out of pocket expense if they have not yet received this vaccine. Advised may also receive vaccine at local pharmacy or Health Dept. Verbalized acceptance and understanding.  Screening Tests Health Maintenance  Topic Date Due  . TETANUS/TDAP  Never done  . DEXA SCAN  Never done  . PNA vac Low Risk Adult (1 of 2 - PCV13) Never done  . COVID-19 Vaccine (3 - Pfizer risk 4-dose series) 04/18/2020  . INFLUENZA VACCINE  12/29/2021 (Originally 07/20/2020)    Health Maintenance  Health Maintenance Due  Topic Date Due  . TETANUS/TDAP  Never done  . DEXA SCAN  Never done  . PNA vac Low Risk Adult (1 of 2 - PCV13) Never done  . COVID-19 Vaccine (3 - Pfizer risk 4-dose series) 04/18/2020    Colorectal cancer screening: Type of screening: Colonoscopy. Completed 04/01/2020. Repeat every 3 years  Mammogram status: No longer required due to age.  Bone Density status: Ordered 02/10/2021. Pt provided with contact info and advised to call to schedule appt.  Lung Cancer Screening: (Low Dose CT Chest recommended if Age 52-80 years, 30 pack-year currently smoking OR have quit w/in 15years.) does not qualify.   Lung Cancer Screening Referral: N/A   Additional Screening:  Hepatitis C Screening: does not qualify;   Vision Screening: Recommended annual ophthalmology exams for early detection of glaucoma and other disorders of the eye. Is the patient up to date with their annual eye exam?  Yes  Who is the provider or what is the name of the office in which the patient attends annual eye exams? Dr. [place] If pt is not established with a  provider, would they like to be referred to a provider to establish care? No .   Dental Screening: Recommended annual dental exams for proper oral hygiene  Community Resource Referral / Chronic Care Management: CRR required this visit?  No   CCM required this visit?  No      Plan:     I have personally reviewed and noted the following in the patient's chart:   . Medical and social history . Use of alcohol, tobacco or illicit drugs  . Current medications and supplements . Functional ability and status . Nutritional status . Physical activity . Advanced directives . List of other physicians . Hospitalizations, surgeries, and ER visits in previous 12 months . Vitals . Screenings to include cognitive, depression, and falls . Referrals and appointments  In addition, I have reviewed and discussed with patient certain preventive protocols, quality metrics, and best practice recommendations. A written personalized care plan for preventive services as well as general preventive health recommendations were provided to patient.     Ofilia Neas, LPN   07/31/7516   Nurse Notes: None

## 2021-02-10 ENCOUNTER — Ambulatory Visit (INDEPENDENT_AMBULATORY_CARE_PROVIDER_SITE_OTHER): Payer: Medicare Other

## 2021-02-10 DIAGNOSIS — Z0000 Encounter for general adult medical examination without abnormal findings: Secondary | ICD-10-CM

## 2021-02-10 DIAGNOSIS — Z780 Asymptomatic menopausal state: Secondary | ICD-10-CM

## 2021-02-10 NOTE — Patient Instructions (Signed)
Kristen Cruz , Thank you for taking time to come for your Medicare Wellness Visit. I appreciate your ongoing commitment to your health goals. Please review the following plan we discussed and let me know if I can assist you in the future.   Screening recommendations/referrals: Colonoscopy: Up to date, next due 04/02/2023 Mammogram: No longer required  Bone Density: Currently due, orders placed this visit  Recommended yearly ophthalmology/optometry visit for glaucoma screening and checkup Recommended yearly dental visit for hygiene and checkup  Vaccinations: Influenza vaccine: Patient declined  Pneumococcal vaccine: Patient declined  Tdap vaccine: Patient declined  Shingles vaccine: Patient declined     Advanced directives: Please bring copies of your advanced medical directives so that we may scan into your chart.   Conditions/risks identified: None   Next appointment: 02/24/21 @ 10:15 am with Dr. Elease Hashimoto    Preventive Care 83 Years and Older, Female Preventive care refers to lifestyle choices and visits with your health care provider that can promote health and wellness. What does preventive care include?  A yearly physical exam. This is also called an annual well check.  Dental exams once or twice a year.  Routine eye exams. Ask your health care provider how often you should have your eyes checked.  Personal lifestyle choices, including:  Daily care of your teeth and gums.  Regular physical activity.  Eating a healthy diet.  Avoiding tobacco and drug use.  Limiting alcohol use.  Practicing safe sex.  Taking low-dose aspirin every day.  Taking vitamin and mineral supplements as recommended by your health care provider. What happens during an annual well check? The services and screenings done by your health care provider during your annual well check will depend on your age, overall health, lifestyle risk factors, and family history of disease. Counseling  Your  health care provider may ask you questions about your:  Alcohol use.  Tobacco use.  Drug use.  Emotional well-being.  Home and relationship well-being.  Sexual activity.  Eating habits.  History of falls.  Memory and ability to understand (cognition).  Work and work Statistician.  Reproductive health. Screening  You may have the following tests or measurements:  Height, weight, and BMI.  Blood pressure.  Lipid and cholesterol levels. These may be checked every 5 years, or more frequently if you are over 75 years old.  Skin check.  Lung cancer screening. You may have this screening every year starting at age 46 if you have a 30-pack-year history of smoking and currently smoke or have quit within the past 15 years.  Fecal occult blood test (FOBT) of the stool. You may have this test every year starting at age 83.  Flexible sigmoidoscopy or colonoscopy. You may have a sigmoidoscopy every 5 years or a colonoscopy every 10 years starting at age 49.  Hepatitis C blood test.  Hepatitis B blood test.  Sexually transmitted disease (STD) testing.  Diabetes screening. This is done by checking your blood sugar (glucose) after you have not eaten for a while (fasting). You may have this done every 1-3 years.  Bone density scan. This is done to screen for osteoporosis. You may have this done starting at age 83.  Mammogram. This may be done every 1-2 years. Talk to your health care provider about how often you should have regular mammograms. Talk with your health care provider about your test results, treatment options, and if necessary, the need for more tests. Vaccines  Your health care provider may recommend certain  vaccines, such as:  Influenza vaccine. This is recommended every year.  Tetanus, diphtheria, and acellular pertussis (Tdap, Td) vaccine. You may need a Td booster every 10 years.  Zoster vaccine. You may need this after age 83.  Pneumococcal 13-valent  conjugate (PCV13) vaccine. One dose is recommended after age 83.  Pneumococcal polysaccharide (PPSV23) vaccine. One dose is recommended after age 83. Talk to your health care provider about which screenings and vaccines you need and how often you need them. This information is not intended to replace advice given to you by your health care provider. Make sure you discuss any questions you have with your health care provider. Document Released: 01/02/2016 Document Revised: 08/25/2016 Document Reviewed: 10/07/2015 Elsevier Interactive Patient Education  2017 [place] Prevention in the Home Falls can cause injuries. They can happen to people of all ages. There are many things you can do to make your home safe and to help prevent falls. What can I do on the outside of my home?  Regularly fix the edges of walkways and driveways and fix any cracks.  Remove anything that might make you trip as you walk through a door, such as a raised step or threshold.  Trim any bushes or trees on the path to your home.  Use bright outdoor lighting.  Clear any walking paths of anything that might make someone trip, such as rocks or tools.  Regularly check to see if handrails are loose or broken. Make sure that both sides of any steps have handrails.  Any raised decks and porches should have guardrails on the edges.  Have any leaves, snow, or ice cleared regularly.  Use sand or salt on walking paths during winter.  Clean up any spills in your garage right away. This includes oil or grease spills. What can I do in the bathroom?  Use night lights.  Install grab bars by the toilet and in the tub and shower. Do not use towel bars as grab bars.  Use non-skid mats or decals in the tub or shower.  If you need to sit down in the shower, use a plastic, non-slip stool.  Keep the floor dry. Clean up any water that spills on the floor as soon as it happens.  Remove soap buildup in the tub or  shower regularly.  Attach bath mats securely with double-sided non-slip rug tape.  Do not have throw rugs and other things on the floor that can make you trip. What can I do in the bedroom?  Use night lights.  Make sure that you have a light by your bed that is easy to reach.  Do not use any sheets or blankets that are too big for your bed. They should not hang down onto the floor.  Have a firm chair that has side arms. You can use this for support while you get dressed.  Do not have throw rugs and other things on the floor that can make you trip. What can I do in the kitchen?  Clean up any spills right away.  Avoid walking on wet floors.  Keep items that you use a lot in easy-to-reach places.  If you need to reach something above you, use a strong step stool that has a grab bar.  Keep electrical cords out of the way.  Do not use floor polish or wax that makes floors slippery. If you must use wax, use non-skid floor wax.  Do not have throw rugs and other things  on the floor that can make you trip. What can I do with my stairs?  Do not leave any items on the stairs.  Make sure that there are handrails on both sides of the stairs and use them. Fix handrails that are broken or loose. Make sure that handrails are as long as the stairways.  Check any carpeting to make sure that it is firmly attached to the stairs. Fix any carpet that is loose or worn.  Avoid having throw rugs at the top or bottom of the stairs. If you do have throw rugs, attach them to the floor with carpet tape.  Make sure that you have a light switch at the top of the stairs and the bottom of the stairs. If you do not have them, ask someone to add them for you. What else can I do to help prevent falls?  Wear shoes that:  Do not have high heels.  Have rubber bottoms.  Are comfortable and fit you well.  Are closed at the toe. Do not wear sandals.  If you use a stepladder:  Make sure that it is fully  opened. Do not climb a closed stepladder.  Make sure that both sides of the stepladder are locked into place.  Ask someone to hold it for you, if possible.  Clearly mark and make sure that you can see:  Any grab bars or handrails.  First and last steps.  Where the edge of each step is.  Use tools that help you move around (mobility aids) if they are needed. These include:  Canes.  Walkers.  Scooters.  Crutches.  Turn on the lights when you go into a dark area. Replace any light bulbs as soon as they burn out.  Set up your furniture so you have a clear path. Avoid moving your furniture around.  If any of your floors are uneven, fix them.  If there are any pets around you, be aware of where they are.  Review your medicines with your doctor. Some medicines can make you feel dizzy. This can increase your chance of falling. Ask your doctor what other things that you can do to help prevent falls. This information is not intended to replace advice given to you by your health care provider. Make sure you discuss any questions you have with your health care provider. Document Released: 10/02/2009 Document Revised: 05/13/2016 Document Reviewed: 01/10/2015 Elsevier Interactive Patient Education  2017 [employer].

## 2021-02-24 ENCOUNTER — Telehealth (INDEPENDENT_AMBULATORY_CARE_PROVIDER_SITE_OTHER): Payer: Medicare Other | Admitting: Family Medicine

## 2021-02-24 ENCOUNTER — Other Ambulatory Visit: Payer: Self-pay

## 2021-02-24 ENCOUNTER — Encounter: Payer: Self-pay | Admitting: Family Medicine

## 2021-02-24 VITALS — BP 125/75 | Wt 85.6 lb

## 2021-02-24 DIAGNOSIS — I480 Paroxysmal atrial fibrillation: Secondary | ICD-10-CM

## 2021-02-24 DIAGNOSIS — C182 Malignant neoplasm of ascending colon: Secondary | ICD-10-CM

## 2021-02-24 DIAGNOSIS — I10 Essential (primary) hypertension: Secondary | ICD-10-CM | POA: Diagnosis not present

## 2021-02-24 DIAGNOSIS — E538 Deficiency of other specified B group vitamins: Secondary | ICD-10-CM | POA: Diagnosis not present

## 2021-02-24 NOTE — Progress Notes (Signed)
Patient ID: Kristen Cruz, female   DOB: Sep 19, 1936, 85 y.o.   MRN: [id number]   This visit type was conducted due to national recommendations for restrictions regarding the COVID-19 pandemic in an effort to limit this patient's exposure and mitigate transmission in our community.   Virtual Visit via Video Note  I connected with Kristen Cruz on 02/24/21 at 10:15 AM EST by a video enabled telemedicine application and verified that I am speaking with the correct person using two identifiers.  Location patient: home Location provider:work or home office Persons participating in the virtual visit: patient, provider  I discussed the limitations of evaluation and management by telemedicine and the availability of in person appointments. The patient expressed understanding and agreed to proceed.   HPI:  Kristen Cruz has history of hypertension, atrial fibrillation, colon cancer, history of sinus tachycardia, history of low B12.  She had presented with some anemia back in late 2019 and ended up with EGD and colonoscopy which revealed cecal mass.  She was diagnosed with colon cancer with reportedly 4 out of 12 nodes positive.  She went through treatment with chemo and most recent CEA have been normal.  She is getting follow-up surveillance colonoscopies.  Her husband of 74 years marriage died last 17-Aug-2023 of complications of congestive heart failure and recurrent cancer.  She has naturally been grieving that but has very supportive family with 3 children 7 grandchildren and overall is improved.  She remains on Eliquis 2.5 mg twice daily for A. fib as well as diltiazem.  Denies any recent dizziness, dyspnea, or chest pain.  She has scheduled follow-up CT abdomen, pelvis, chest soon.  She has had 2 Covid vaccines but no booster.  History of B12 deficiency.  Last level was normal.  She takes oral replacement.   ROS: See pertinent positives and negatives per HPI.  Past Medical History:   Diagnosis Date  . Anticoagulant long-term use    eliquis  . Arthritis   . Cecum mass   . History of GI bleed 09/22/2018   upper  . HOH (hard of hearing)   . Hypertension   . IDA (iron deficiency anemia)   . PAF (paroxysmal atrial fibrillation) [facility]) cardiologist-- dr Radford Pax   CHADS2VASC score is 4 on Apixaban  . PONV (postoperative nausea and vomiting)   . Rash    "allergic to my on body tempurature , if I have fever I break out in a rash"  . Right nephrolithiasis    per CT 09-22-2018  . Wears dentures    upper  . Wears glasses     Past Surgical History:  Procedure Laterality Date  . ABDOMINAL HYSTERECTOMY  1978   ovaries remain  . BREAST CYST EXCISION Right yrs ago   benign  . CATARACT EXTRACTION W/ INTRAOCULAR LENS  IMPLANT, BILATERAL  2014  approx.  . COLONOSCOPY  01-22-2019   dr Watt Climes  . PARTIAL COLECTOMY Right 02/06/2019   Procedure: OPEN RIGHT COLECTOMY;  Surgeon: Armandina Gemma, MD;  Location: [facility];  Service: General;  Laterality: Right;    Family History  Problem Relation Age of Onset  . Hypertension Mother   . Heart Problems Mother   . Heart attack Mother   . Cancer Sister   . Cancer Brother     SOCIAL HX: Non-smoker.  Widowed since 08-17-2023.  Husband passed away complications of congestive heart failure then.   Current Outpatient Medications:  .  Cyanocobalamin (VITAMIN B-12 PO), Take 1,000 mg by mouth  daily., Disp: , Rfl:  .  DILT-XR 180 MG 24 hr capsule, TAKE 1 CAPSULE BY MOUTH EVERY DAY, Disp: 90 capsule, Rfl: 1 .  ELIQUIS 2.5 MG TABS tablet, TAKE 1 TABLET BY MOUTH TWICE A DAY, Disp: 60 tablet, Rfl: 5 .  RESTASIS 0.05 % ophthalmic emulsion, 1 drop 2 (two) times daily., Disp: , Rfl:  .  vitamin C (ASCORBIC ACID) 250 MG tablet, Take 1 tablet (250 mg total) by mouth daily., Disp: , Rfl:   EXAM:  VITALS per patient if applicable:  GENERAL: alert, oriented, appears well and in no acute distress  HEENT: atraumatic, conjunttiva clear, no obvious  abnormalities on inspection of external nose and ears  NECK: normal movements of the head and neck  LUNGS: on inspection no signs of respiratory distress, breathing rate appears normal, no obvious gross SOB, gasping or wheezing  CV: no obvious cyanosis  MS: moves all visible extremities without noticeable abnormality  PSYCH/NEURO: pleasant and cooperative, no obvious depression or anxiety, speech and thought processing grossly intact  ASSESSMENT AND PLAN:  Discussed the following assessment and plan:  #1 history of atrial fibrillation.  Patient on chronic Eliquis and Cardizem.  She states her heart rate has been stable recently. -Continue close follow-up with cardiology regarding that  #2 history of hypertension.  Currently treated with Cardizem  #3 history of colon cancer.  She is getting regular ongoing surveillance with labs and CT scans per oncology  #4 B12 deficiency.  Continue oral replacement with at least 500 mcg daily and needs follow-up B12 level with next labs  Suggest an office follow-up in the next year for physical She has not had documented DEXA scan and this was addressed at her recent Medicare wellness visit and we do agree with going ahead and getting that scheduled.     I discussed the assessment and treatment plan with the patient. The patient was provided an opportunity to ask questions and all were answered. The patient agreed with the plan and demonstrated an understanding of the instructions.   The patient was advised to call back or seek an in-person evaluation if the symptoms worsen or if the condition fails to improve as anticipated.     Carolann Littler, MD

## 2021-03-23 ENCOUNTER — Other Ambulatory Visit (HOSPITAL_BASED_OUTPATIENT_CLINIC_OR_DEPARTMENT_OTHER): Payer: Medicare Other

## 2021-03-27 ENCOUNTER — Ambulatory Visit (HOSPITAL_BASED_OUTPATIENT_CLINIC_OR_DEPARTMENT_OTHER)
Admission: RE | Admit: 2021-03-27 | Discharge: 2021-03-27 | Disposition: A | Discharge status: Home / Self Care | Payer: Medicare Other | Source: Ambulatory Visit | Attending: Oncology | Admitting: Oncology

## 2021-03-27 ENCOUNTER — Other Ambulatory Visit: Payer: Self-pay

## 2021-03-27 DIAGNOSIS — C182 Malignant neoplasm of ascending colon: Secondary | ICD-10-CM | POA: Diagnosis present

## 2021-03-27 MED ORDER — IOHEXOL 300 MG/ML  SOLN
100.0000 mL | Freq: Once | INTRAMUSCULAR | Status: AC | PRN
  Administered 2021-03-27: 80 mL via INTRAVENOUS

## 2021-03-30 ENCOUNTER — Telehealth: Payer: Self-pay | Admitting: Nurse Practitioner

## 2021-03-30 ENCOUNTER — Encounter: Payer: Self-pay | Admitting: Nurse Practitioner

## 2021-03-30 ENCOUNTER — Inpatient Hospital Stay: Payer: Medicare Other

## 2021-03-30 ENCOUNTER — Inpatient Hospital Stay: Payer: Medicare Other | Attending: Oncology | Admitting: Nurse Practitioner

## 2021-03-30 ENCOUNTER — Other Ambulatory Visit: Payer: Self-pay

## 2021-03-30 ENCOUNTER — Other Ambulatory Visit: Payer: Medicare Other

## 2021-03-30 VITALS — BP 147/80 | HR 96 | Temp 97.4°F | Resp 18 | Ht 62.0 in | Wt 88.0 lb

## 2021-03-30 DIAGNOSIS — R911 Solitary pulmonary nodule: Secondary | ICD-10-CM | POA: Insufficient documentation

## 2021-03-30 DIAGNOSIS — C180 Malignant neoplasm of cecum: Secondary | ICD-10-CM | POA: Diagnosis present

## 2021-03-30 DIAGNOSIS — C182 Malignant neoplasm of ascending colon: Secondary | ICD-10-CM

## 2021-03-30 LAB — BASIC METABOLIC PANEL - CANCER CENTER ONLY
Anion gap: 10 (ref 5–15)
BUN: 13 mg/dL (ref 8–23)
CO2: 27 mmol/L (ref 22–32)
Calcium: 9.5 mg/dL (ref 8.9–10.3)
Chloride: 102 mmol/L (ref 98–111)
Creatinine: 0.77 mg/dL (ref 0.44–1.00)
GFR, Estimated: >60 mL/min (ref >60)
Glucose, Bld: 119 mg/dL — ABNORMAL HIGH (ref 70–99)
Potassium: 4.1 mmol/L (ref 3.5–5.1)
Sodium: 139 mmol/L (ref 135–145)

## 2021-03-30 LAB — CEA (IN HOUSE-CHCC): CEA (CHCC-In House): 1.37 ng/mL (ref 0.00–5.00)

## 2021-03-30 LAB — POCT I-STAT CREATININE: Creatinine, Ser: 0.7 mg/dL (ref 0.44–1.00)

## 2021-03-30 NOTE — Progress Notes (Addendum)
[place] OFFICE PROGRESS NOTE   Diagnosis: Colon cancer  INTERVAL HISTORY:   Ms. Valeri returns as scheduled.  No change in bowel habits.  No blood or pain with bowel movements.  Occasional "gas pain" related to diet.  No nausea or vomiting.  She describes her appetite as "great".  She has never smoked.  She denies shortness of breath.  No cough.  Objective:  Vital signs in last 24 hours:  Blood pressure (!) 147/80, pulse 96, temperature (!) 97.4 F (36.3 C), temperature source Tympanic, resp. rate 18, height 5' 2" (1.575 m), weight 88 lb (39.9 kg), SpO2 100 %.    HEENT: Neck without mass. Lymphatics: No palpable cervical, supraclavicular, axillary or inguinal lymph nodes. Resp: Lungs clear bilaterally. Cardio: Regular rate and rhythm. GI: Abdomen soft and nontender.  No mass.  No hepatosplenomegaly. Vascular: No leg edema.    Lab Results:  Lab Results  Component Value Date   WBC 8.0 11/12/2020   HGB 12.6 11/12/2020   HCT 39.7 11/12/2020   MCV 94 11/12/2020   PLT 281 11/12/2020   NEUTROABS 5.9 04/11/2020    Imaging:  No results found.  Medications: I have reviewed the patient's current medications.  Assessment/Plan: 1. 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 4/24 lymph nodes positive, 1 tumor deposit, MSI-stable, no loss of mismatch repair protein expression ? CT abdomen/pelvis 09/22/2018-no acute finding, 10 mm right hepatic dome hypodensity-suspected cyst, nonobstructing right nephrolithiasis ? CT chest 03/28/2019-perifissural right middle lobe nodule-potentially atelectasis or scarring ? Cycle 1 adjuvant Xeloda 03/12/2019 ? Cycle 2 adjuvant Xeloda 04/02/2019 ? Cycle 3 adjuvant Xeloda 04/23/2019 ? Cycle 4 adjuvant Xeloda 05/14/2019 ? Cycle 5 adjuvant Xeloda 06/04/2019 ? Cycle 6 adjuvant Xeloda 06/25/2019 ? Cycle 7 adjuvant Xeloda 07/16/2019 ? Cycle 8 adjuvant Xeloda 08/02/2019 ? CTs 03/24/2020-no evidence of  metastatic disease, stable right middle lobe subpleural nodule ? CTs 03/27/2021-no definitive findings to suggest metastatic disease in the chest, abdomen or pelvis.  Previously noted subpleural nodule in the right middle lobe appears slightly larger than prior studies currently measuring 9 x 8 mm.  Nodule is predominantly groundglass attenuation within internal air bronchograms, does appear to cause some mild retraction of the minor fissure.  5 mm subpleural nodule in the periphery of the right upper lobe, stable in retrospect compared to the prior examination and considered benign.  No other suspicious appearing pulmonary nodule or mass noted. 2. Postoperative ileus-resolved following NG tube decompression 3. History of iron deficiency anemia secondary to #1 4. Atrial fibrillation-maintained on apixaban 5. Family history of colon and esophagus cancer 6. 7 mm right middle lobe nodule on chest CT 03/28/2019-plan for repeat CT at a 3 to 78-monthinterval  08/23/2019 chest CT -right middle lobe nodule, smaller, flat/platelike-repeat CT 12-18 months suggested  CT chest 03/24/2020-unchanged right middle lobe subpleural nodule  CT chest 03/27/2021-previously noted subpleural nodule right middle lobe appears slightly larger   Disposition: Ms. SBeshararemains in remission from colon cancer.  Recent surveillance CT scans show no definite evidence of metastatic disease in the chest, abdomen or pelvis.  The subpleural nodule in the right middle lobe may be slightly larger over approximately 1 year.  Dr. SBenay Spicereviewed the CT report/images with Ms. SSantilloat today's visit and recommends next CT scans at a 1 year interval.  She agrees with this plan.  She will return for CEA and follow-up visit in 6 months.  We are available to see  her sooner if needed.  Patient seen with Dr. Benay Spice.    Ned Card ANP/GNP-BC   03/30/2021  10:28 AM  This was a shared visit with Ned Card.  Ms. Narayan is in remission  from colon cancer.  We reviewed the restaging CT images with her.  The groundglass nodule in the right lung has been present for the past 2 years.  She has never smoked.  I suspect this is a benign lesion.  I was present for greater than 50% of today's visit.  I performed medical decision making.  Julieanne Manson, MD

## 2021-03-30 NOTE — Telephone Encounter (Signed)
Scheduled appt per 4/11 los - gave patient AVS and calender   

## 2021-03-31 ENCOUNTER — Telehealth: Payer: Self-pay

## 2021-03-31 NOTE — Telephone Encounter (Signed)
-----   Message from [name], NP sent at 03/30/2021  3:57 PM EDT ----- Please let her know CEA is stable in normal range.  Follow-up as scheduled.

## 2021-03-31 NOTE — Telephone Encounter (Signed)
Spoke with pt made her aware of CT results and to follow up as scheduled

## 2021-04-02 ENCOUNTER — Ambulatory Visit: Payer: Medicare Other | Admitting: Nurse Practitioner

## 2021-04-08 ENCOUNTER — Other Ambulatory Visit: Payer: Self-pay | Admitting: Cardiology

## 2021-07-02 ENCOUNTER — Other Ambulatory Visit: Payer: Self-pay | Admitting: Cardiology

## 2021-07-02 NOTE — Telephone Encounter (Signed)
Prescription refill request for Eliquis received. Indication: PAF Last office visit: 11/12/20  T. Turner MD Scr: 0.77 on 03/30/21 Age:  85 Weight: 38.8kg  Based on above findings Eliquis 2.5mg  twice daily is the appropriate dose.  Refill approved.

## 2021-09-29 ENCOUNTER — Other Ambulatory Visit: Payer: Self-pay

## 2021-09-29 ENCOUNTER — Inpatient Hospital Stay: Payer: Medicare Other

## 2021-09-29 ENCOUNTER — Inpatient Hospital Stay: Payer: Medicare Other | Attending: Oncology | Admitting: Oncology

## 2021-09-29 VITALS — BP 159/81 | HR 86 | Temp 98.0°F | Resp 18 | Ht 62.0 in | Wt 85.2 lb

## 2021-09-29 DIAGNOSIS — I4891 Unspecified atrial fibrillation: Secondary | ICD-10-CM | POA: Insufficient documentation

## 2021-09-29 DIAGNOSIS — C182 Malignant neoplasm of ascending colon: Secondary | ICD-10-CM

## 2021-09-29 DIAGNOSIS — C180 Malignant neoplasm of cecum: Secondary | ICD-10-CM | POA: Diagnosis not present

## 2021-09-29 LAB — CEA (ACCESS): CEA (CHCC): 1.19 ng/mL (ref 0.00–5.00)

## 2021-09-29 NOTE — Progress Notes (Signed)
[place] OFFICE PROGRESS NOTE   Diagnosis: Colon cancer  INTERVAL HISTORY:   Kristen Cruz returns as scheduled.  She feels well.  She reports a good appetite, but she is not gaining weight.  No difficulty with bowel function.  No new complaint.  No bleeding  Objective:  Vital signs in last 24 hours:  Blood pressure (!) 159/81, pulse 86, temperature 98 F (36.7 C), temperature source Oral, resp. rate 18, height $RemoveBe'5\' 2"'cZBuZFMDi$  (1.575 m), weight 85 lb 3.2 oz (38.6 kg), SpO2 98 %.    Lymphatics: No cervical, supraclavicular, or inguinal nodes.  "Shotty "bilateral axillary nodes Resp: Lungs clear bilaterally Cardio: Regular rate and rhythm, tachycardia GI: No mass, no hepatosplenomegaly Vascular: No leg edema     Lab Results:  Lab Results  Component Value Date   WBC 8.0 11/12/2020   HGB 12.6 11/12/2020   HCT 39.7 11/12/2020   MCV 94 11/12/2020   PLT 281 11/12/2020   NEUTROABS 5.9 04/11/2020    CMP  Lab Results  Component Value Date   NA 139 03/30/2021   K 4.1 03/30/2021   CL 102 03/30/2021   CO2 27 03/30/2021   GLUCOSE 119 (H) 03/30/2021   BUN 13 03/30/2021   CREATININE 0.77 03/30/2021   CALCIUM 9.5 03/30/2021   PROT 7.6 04/11/2020   ALBUMIN 4.4 04/11/2020   AST 20 04/11/2020   ALT 14 04/11/2020   ALKPHOS 96 04/11/2020   BILITOT 0.7 04/11/2020   GFRNONAA >60 03/30/2021   GFRAA 92 11/12/2020    Lab Results  Component Value Date   CEA1 1.37 03/30/2021     Medications: I have reviewed the patient's current medications.   Assessment/Plan:  Colon cancer, cecum, stage IIIc (T3N2), status post a right colectomy 02/06/2019 4/24 lymph nodes positive, 1 tumor deposit, MSI-stable, no loss of mismatch repair protein expression CT abdomen/pelvis 09/22/2018-no acute finding, 10 mm right hepatic dome hypodensity-suspected cyst, nonobstructing right nephrolithiasis CT chest 03/28/2019- perifissural right middle lobe nodule-potentially atelectasis or  scarring Cycle 1 adjuvant Xeloda 03/12/2019 Cycle 2 adjuvant Xeloda 04/02/2019 Cycle 3 adjuvant Xeloda 04/23/2019 Cycle 4 adjuvant Xeloda 05/14/2019 Cycle 5 adjuvant Xeloda 06/04/2019 Cycle 6 adjuvant Xeloda 06/25/2019 Cycle 7 adjuvant Xeloda 07/16/2019  Cycle 8 adjuvant Xeloda 08/02/2019 CTs 03/24/2020-no evidence of metastatic disease, stable right middle lobe subpleural nodule CTs 03/27/2021-no definitive findings to suggest metastatic disease in the chest, abdomen or pelvis.  Previously noted subpleural nodule in the right middle lobe appears slightly larger than prior studies currently measuring 9 x 8 mm.  Nodule is predominantly groundglass attenuation within internal air bronchograms, does appear to cause some mild retraction of the minor fissure.  5 mm subpleural nodule in the periphery of the right upper lobe, stable in retrospect compared to the prior examination and considered benign.  No other suspicious appearing pulmonary nodule or mass noted. Postoperative ileus-resolved following NG tube decompression History of iron deficiency anemia secondary to #1 Atrial fibrillation-maintained on apixaban Family history of colon and esophagus cancer 7 mm right middle lobe nodule on chest CT 03/28/2019-plan for repeat CT at a 3 to 84-month interval 08/23/2019 chest CT -right middle lobe nodule, smaller, flat/platelike-repeat CT 12-18 months suggested CT chest 03/24/2020-unchanged right middle lobe subpleural nodule CT chest 03/27/2021-previously noted subpleural nodule right middle lobe appears slightly larger   Disposition: Kristen Cruz is in clinical remission from colon cancer.  We will follow-up on the CEA from today.  She will return for an office visit and restaging CTs in  6 months.  I encouraged her to attempt to gain weight.  [name], MD  09/29/2021  10:59 AM

## 2021-09-30 LAB — CEA (IN HOUSE-CHCC): CEA (CHCC-In House): 1.12 ng/mL (ref 0.00–5.00)

## 2021-10-01 ENCOUNTER — Telehealth: Payer: Self-pay

## 2021-10-01 NOTE — Telephone Encounter (Signed)
Called spoke with pt made her aware of most recent CEA results no questions asked no concerns voiced nothing further call ended

## 2021-10-01 NOTE — Telephone Encounter (Signed)
-----   Message from Ladell Pier, MD sent at 09/30/2021  4:37 PM EDT ----- Please call patient, CEA is normal, follow-up as scheduled

## 2021-11-16 ENCOUNTER — Ambulatory Visit (INDEPENDENT_AMBULATORY_CARE_PROVIDER_SITE_OTHER): Payer: Medicare Other

## 2021-11-16 ENCOUNTER — Other Ambulatory Visit: Payer: Self-pay

## 2021-11-16 ENCOUNTER — Encounter: Payer: Self-pay | Admitting: Cardiology

## 2021-11-16 ENCOUNTER — Ambulatory Visit (INDEPENDENT_AMBULATORY_CARE_PROVIDER_SITE_OTHER): Payer: Medicare Other | Admitting: Cardiology

## 2021-11-16 VITALS — BP 148/66 | HR 111 | Ht 62.0 in | Wt 86.4 lb

## 2021-11-16 DIAGNOSIS — I480 Paroxysmal atrial fibrillation: Secondary | ICD-10-CM

## 2021-11-16 DIAGNOSIS — I10 Essential (primary) hypertension: Secondary | ICD-10-CM

## 2021-11-16 LAB — TSH: TSH: 4.6 u[IU]/mL — ABNORMAL HIGH (ref 0.450–4.500)

## 2021-11-16 MED ORDER — DILTIAZEM HCL ER 180 MG PO CP24: ORAL_CAPSULE | ORAL | 3 refills | Status: DC

## 2021-11-16 MED ORDER — APIXABAN 2.5 MG PO TABS: 2.5000 mg | ORAL_TABLET | Freq: Two times a day (BID) | ORAL | 11 refills | Status: DC

## 2021-11-16 NOTE — Progress Notes (Unsigned)
Enrolled for [employer] to mail a ZIO XT long term holter monitor to the patients address on file.  

## 2021-11-16 NOTE — Progress Notes (Signed)
Cardiology Office Note:    Date:  11/16/2021   ID:  Kristen Cruz, DOB 04-Jul-1936, MRN [id number]  PCP:  Kristen Post, MD  Cardiologist:  Kristen Him, MD   Electrophysiologist:  None   Referring MD: Kristen Post, MD   Chief Complaint:  Atrial Fibrillation and Hypertension    Patient Profile:    Kristen Cruz is a 85 y.o. female with:  Paroxysmal atrial fibrillation  CHA2DS2-VASc=4 (female, age x 2, >> Apixaban) Hypertension  Coronary Ca noted on CT (08/2019) Colon CA s/p R colectomy in 01/2019 Fe deficiency anemia  Prior CV studies: Echocardiogram 03/08/14 EF 55-65, no RWMA  History of Present Illness:    Kristen Cruz  is here today for followup and is doing well.  She denies any chest pain or pressure, SOB, DOE, PND, orthopnea, LE edema, dizziness or syncope. She says that if she is working outside she will notice her heart racing. She is compliant with her meds and is tolerating meds with no SE.      Past Medical History:  Diagnosis Date   Anticoagulant long-term use    eliquis   Arthritis    Cecum mass    History of GI bleed 09/22/2018   upper   HOH (hard of hearing)    Hypertension    IDA (iron deficiency anemia)    PAF (paroxysmal atrial fibrillation) Kristen Cruz) cardiologist-- dr Kristen Cruz score is 4 on Apixaban   PONV (postoperative nausea and vomiting)    Rash    "allergic to my on body tempurature , if I have fever I break out in a rash"   Right nephrolithiasis    per CT 09-22-2018   Wears dentures    upper   Wears glasses     Current Medications: Current Meds  Medication Sig   Cyanocobalamin (VITAMIN B-12 PO) Take 1,000 mg by mouth daily.   DILT-XR 180 MG 24 hr capsule TAKE 1 CAPSULE BY MOUTH EVERY DAY   ELIQUIS 2.5 MG TABS tablet TAKE 1 TABLET BY MOUTH TWICE A DAY   RESTASIS 0.05 % ophthalmic emulsion 1 drop 2 (two) times daily.   vitamin C (ASCORBIC ACID) 250 MG tablet Take 1 tablet (250 mg total) by mouth daily.      Allergies:   Asa [aspirin], Bentyl [dicyclomine hcl], Sucrets [dyclonine], Sulfa antibiotics, Tetracycline hcl, and Tylenol [acetaminophen]   Social History   Tobacco Use   Smoking status: Never   Smokeless tobacco: Never  Vaping Use   Vaping Use: Never used  Substance Use Topics   Alcohol use: No   Drug use: Never     Family Hx: The patient's family history includes Cancer in her brother and sister; Heart Problems in her mother; Heart attack in her mother; Hypertension in her mother.  ROS   EKGs/Labs/Other Test Reviewed:    EKG:  EKG is   ordered today.  The ekg ordered today demonstrates sinus tachycardia at 111bpm with iRBBB  Recent Labs: 03/30/2021: BUN 13; Creatinine 0.77; Potassium 4.1; Sodium 139   Recent Lipid Panel No results found for: CHOL, TRIG, HDL, CHOLHDL, LDLCALC, LDLDIRECT  Physical Exam:    VS:  BP (!) 148/66   Pulse (!) 111   Ht 5\' 2"  (1.575 m)   Wt 86 lb 6.4 oz (39.2 kg)   SpO2 97%   BMI 15.80 kg/m     Wt Readings from Last 3 Encounters:  11/16/21 86 lb 6.4 oz (39.2 kg)  09/29/21  85 lb 3.2 oz (38.6 kg)  03/30/21 88 lb (39.9 kg)    GEN: Well nourished, well developed in no acute distress HEENT: Normal NECK: No JVD; No carotid bruits LYMPHATICS: No lymphadenopathy CARDIAC:RRR, no murmurs, rubs, gallops RESPIRATORY:  Clear to auscultation without rales, wheezing or rhonchi  ABDOMEN: Soft, non-tender, non-distended MUSCULOSKELETAL:  No edema; No deformity  SKIN: Warm and dry NEUROLOGIC:  Alert and oriented x 3 PSYCHIATRIC:  Normal affect     EKG was done in office today and showed sinus tachycardia with rSR' in V1  ASSESSMENT & PLAN:    1.  Paroxysmal atrial fibrillation (HCC) -She continues to maintain normal sinus rhythm and denies any palpitations -She has not had any bleeding problems on apixaban -Continue prescription drug management with apixaban 2.5 mg twice daily Dosed for age > 30 and weight < 60kg) and Cardizem XR 180mg   daily -she does not want to increase Cardizem or go on BB -I will get a 3 day Ziopatch to assess HR control  2. Essential hypertension, benign -BP is elevated today on exam but says she has white coat HTN and that it is in the 120/39mmHg -I have asked her to check her BP daily for a week and call with results -continue Cardizem XR 180mg  daily  Dispo: followup 1 year  Medication Adjustments/Labs and Tests Ordered: Current medicines are reviewed at length with the patient today.  Concerns regarding medicines are outlined above.  Tests Ordered: Orders Placed This Encounter  Procedures   EKG 12-Lead    Medication Changes: No orders of the defined types were placed in this encounter.   Signed, Kristen Him, MD  11/16/2021 10:00 AM    [address], [place], [place]  [address] Phone: [phone]; Fax: [phone]

## 2021-11-16 NOTE — Patient Instructions (Signed)
Medication Instructions:  Your physician recommends that you continue on your current medications as directed. Please refer to the Current Medication list given to you today.  *If you need a refill on your cardiac medications before your next appointment, please call your pharmacy*   Lab Work: TODAY: TSH If you have labs (blood work) drawn today and your tests are completely normal, you will receive your results only by: [place] (if you have MyChart) OR A paper copy in the mail If you have any lab test that is abnormal or we need to change your treatment, we will call you to review the results.   Testing/Procedures: Your physician has recommended that you wear an event monitor. Event monitors are medical devices that record the heart's electrical activity. Doctors most often [place] these monitors to diagnose arrhythmias. Arrhythmias are problems with the speed or rhythm of the heartbeat. The monitor is a small, portable device. You can wear one while you do your normal daily activities. This is usually used to diagnose what is causing palpitations/syncope (passing out).  Follow-Up: At [facility], you and your health needs are our priority.  As part of our continuing mission to provide you with exceptional heart care, we have created designated Provider Care Teams.  These Care Teams include your primary Cardiologist (physician) and Advanced Practice Providers (APPs -  Physician Assistants and Nurse Practitioners) who all work together to provide you with the care you need, when you need it.  Your next appointment:   1 year(s)  The format for your next appointment:   In Person  Provider:   Fransico Him, MD     Other Instructions Dr. Radford Pax would like for you to check your blood pressure daily for one week and call us with a list of your readings.    ZIO XT- Long Term Monitor Instructions  Your physician has requested you wear a ZIO patch monitor for 3 days.  This is a single  patch monitor. [employer] supplies one patch monitor per enrollment. Additional stickers are not available. Please do not apply patch if you will be having a Nuclear Stress Test,  Echocardiogram, Cardiac CT, MRI, or Chest Xray during the period you would be wearing the  monitor. The patch cannot be worn during these tests. You cannot remove and re-apply the  ZIO XT patch monitor.  Your ZIO patch monitor will be mailed 3 day USPS to your address on file. It may take 3-5 days  to receive your monitor after you have been enrolled.  Once you have received your monitor, please review the enclosed instructions. Your monitor  has already been registered assigning a specific monitor serial # to you.  Billing and Patient Assistance Program Information  We have supplied [employer] with any of your insurance information on file for billing purposes. [employer] offers a sliding scale Patient Assistance Program for patients that do not have  insurance, or whose insurance does not completely cover the cost of the ZIO monitor.  You must apply for the Patient Assistance Program to qualify for this discounted rate.  To apply, please call [employer] at [phone], select option 4, select option 2, ask to apply for  Patient Assistance Program. Theodore Demark will ask your household income, and how many people  are in your household. They will quote your out-of-pocket cost based on that information.  [employer] will also be able to set up a 31-month, interest-free payment plan if needed.  Applying the monitor   Shave hair from  upper left chest.  Hold abrader disc by orange tab. Rub abrader in 40 strokes over the upper left chest as  indicated in your monitor instructions.  Clean area with 4 enclosed alcohol pads. Let dry.  Apply patch as indicated in monitor instructions. Patch will be placed under collarbone on left  side of chest with arrow pointing upward.  Rub patch adhesive wings for 2 minutes. Remove white label marked  "1". Remove the white  label marked "2". Rub patch adhesive wings for 2 additional minutes.  While looking in a mirror, press and release button in center of patch. A small green light will  flash 3-4 times. This will be your only indicator that the monitor has been turned on.  Do not shower for the first 24 hours. You may shower after the first 24 hours.  Press the button if you feel a symptom. You will hear a small click. Record Date, Time and  Symptom in the Patient Logbook.  When you are ready to remove the patch, follow instructions on the last 2 pages of Patient  Logbook. Stick patch monitor onto the last page of Patient Logbook.  Place Patient Logbook in the blue and white box. Use locking tab on box and tape box closed  securely. The blue and white box has prepaid postage on it. Please place it in the mailbox as  soon as possible. Your physician should have your test results approximately 7 days after the  monitor has been mailed back to [facility].  Call [facility] at [phone] if you have questions regarding  your ZIO XT patch monitor. Call them immediately if you see an orange light blinking on your  monitor.  If your monitor falls off in less than 4 days, contact our Monitor department at [phone].  If your monitor becomes loose or falls off after 4 days call [employer] at [phone] for  suggestions on securing your monitor

## 2021-11-16 NOTE — Addendum Note (Signed)
Addended by: Antonieta Iba on: 11/16/2021 10:13 AM   Modules accepted: Orders

## 2021-11-20 DIAGNOSIS — I480 Paroxysmal atrial fibrillation: Secondary | ICD-10-CM | POA: Diagnosis not present

## 2021-11-20 DIAGNOSIS — I10 Essential (primary) hypertension: Secondary | ICD-10-CM | POA: Diagnosis not present

## 2021-11-24 ENCOUNTER — Telehealth: Payer: Self-pay | Admitting: Cardiology

## 2021-11-24 NOTE — Telephone Encounter (Signed)
Pt c/o BP issue: STAT if pt c/o blurred vision, one-sided weakness or slurred speech  1. What are your last 5 BP readings?  11/16/21: 100/64 HR 103 11/17/21: 113/62 HR 92 11/18/21: 114/66 HR 80 11/19/21: 112/61 HR 90 11/20/21: 115/65 HR 85 11/21/21: 123/62 HR 75 11/22/21: 116/68 HR 84 11/23/21: 116/63 HR 81   2. Are you having any other symptoms (ex. Dizziness, headache, blurred vision, passed out)? no  3. What is your BP issue? Patient was told to take her BP daily for a week and to call and document them for Dr. Radford Pax

## 2021-11-24 NOTE — Telephone Encounter (Signed)
Spoke with the patient and advised to continue on current medications. Patient verbalized understanding.

## 2022-01-08 ENCOUNTER — Telehealth: Payer: Self-pay | Admitting: Family Medicine

## 2022-01-08 NOTE — Telephone Encounter (Signed)
Left message for patient to call back and schedule Medicare Annual Wellness Visit (AWV) either virtually or in office. Left  my Herbie Drape number [phone]   Last AWV 02/10/21 ; please schedule at anytime with [facility] Nurse Health Advisor 1 or 2   Awv can be scheduled calendar year uhc  This should be a 45 minute visit.

## 2022-01-12 ENCOUNTER — Ambulatory Visit (INDEPENDENT_AMBULATORY_CARE_PROVIDER_SITE_OTHER): Payer: Medicare Other

## 2022-01-12 VITALS — Ht 62.0 in | Wt 86.0 lb

## 2022-01-12 DIAGNOSIS — Z0000 Encounter for general adult medical examination without abnormal findings: Secondary | ICD-10-CM | POA: Diagnosis not present

## 2022-01-12 NOTE — Patient Instructions (Signed)
Kristen Cruz , Thank you for taking time to come for your Medicare Wellness Visit. I appreciate your ongoing commitment to your health goals. Please review the following plan we discussed and let me know if I can assist you in the future.   Screening recommendations/referrals: Colonoscopy: not required Mammogram: not required Bone Density: decline Recommended yearly ophthalmology/optometry visit for glaucoma screening and checkup Recommended yearly dental visit for hygiene and checkup  Vaccinations: Influenza vaccine: decline Pneumococcal vaccine: decline Tdap vaccine: decline Shingles vaccine: decline   Covid-19: 03/21/2020, 02/29/2020  Advanced directives: Please bring a copy of your POA (Power of Attorney) and/or Living Will to your next appointment.   Conditions/risks identified: none  Next appointment: Follow up in one year for your annual wellness visit    Preventive Care 65 Years and Older, Female Preventive care refers to lifestyle choices and visits with your health care provider that can promote health and wellness. What does preventive care include? A yearly physical exam. This is also called an annual well check. Dental exams once or twice a year. Routine eye exams. Ask your health care provider how often you should have your eyes checked. Personal lifestyle choices, including: Daily care of your teeth and gums. Regular physical activity. Eating a healthy diet. Avoiding tobacco and drug use. Limiting alcohol use. Practicing safe sex. Taking low-dose aspirin every day. Taking vitamin and mineral supplements as recommended by your health care provider. What happens during an annual well check? The services and screenings done by your health care provider during your annual well check will depend on your age, overall health, lifestyle risk factors, and family history of disease. Counseling  Your health care provider may ask you questions about your: Alcohol  use. Tobacco use. Drug use. Emotional well-being. Home and relationship well-being. Sexual activity. Eating habits. History of falls. Memory and ability to understand (cognition). Work and work Statistician. Reproductive health. Screening  You may have the following tests or measurements: Height, weight, and BMI. Blood pressure. Lipid and cholesterol levels. These may be checked every 5 years, or more frequently if you are over 29 years old. Skin check. Lung cancer screening. You may have this screening every year starting at age 4 if you have a 30-pack-year history of smoking and currently smoke or have quit within the past 15 years. Fecal occult blood test (FOBT) of the stool. You may have this test every year starting at age 34. Flexible sigmoidoscopy or colonoscopy. You may have a sigmoidoscopy every 5 years or a colonoscopy every 10 years starting at age 52. Hepatitis C blood test. Hepatitis B blood test. Sexually transmitted disease (STD) testing. Diabetes screening. This is done by checking your blood sugar (glucose) after you have not eaten for a while (fasting). You may have this done every 1-3 years. Bone density scan. This is done to screen for osteoporosis. You may have this done starting at age 43. Mammogram. This may be done every 1-2 years. Talk to your health care provider about how often you should have regular mammograms. Talk with your health care provider about your test results, treatment options, and if necessary, the need for more tests. Vaccines  Your health care provider may recommend certain vaccines, such as: Influenza vaccine. This is recommended every year. Tetanus, diphtheria, and acellular pertussis (Tdap, Td) vaccine. You may need a Td booster every 10 years. Zoster vaccine. You may need this after age 29. Pneumococcal 13-valent conjugate (PCV13) vaccine. One dose is recommended after age 70. Pneumococcal polysaccharide (  PPSV23) vaccine. One dose is  recommended after age 15. Talk to your health care provider about which screenings and vaccines you need and how often you need them. This information is not intended to replace advice given to you by your health care provider. Make sure you discuss any questions you have with your health care provider. Document Released: 01/02/2016 Document Revised: 08/25/2016 Document Reviewed: 10/07/2015 Elsevier Interactive Patient Education  2017 [place] Prevention in the Home Falls can cause injuries. They can happen to people of all ages. There are many things you can do to make your home safe and to help prevent falls. What can I do on the outside of my home? Regularly fix the edges of walkways and driveways and fix any cracks. Remove anything that might make you trip as you walk through a door, such as a raised step or threshold. Trim any bushes or trees on the path to your home. Use bright outdoor lighting. Clear any walking paths of anything that might make someone trip, such as rocks or tools. Regularly check to see if handrails are loose or broken. Make sure that both sides of any steps have handrails. Any raised decks and porches should have guardrails on the edges. Have any leaves, snow, or ice cleared regularly. Use sand or salt on walking paths during winter. Clean up any spills in your garage right away. This includes oil or grease spills. What can I do in the bathroom? Use night lights. Install grab bars by the toilet and in the tub and shower. Do not use towel bars as grab bars. Use non-skid mats or decals in the tub or shower. If you need to sit down in the shower, use a plastic, non-slip stool. Keep the floor dry. Clean up any water that spills on the floor as soon as it happens. Remove soap buildup in the tub or shower regularly. Attach bath mats securely with double-sided non-slip rug tape. Do not have throw rugs and other things on the floor that can make you  trip. What can I do in the bedroom? Use night lights. Make sure that you have a light by your bed that is easy to reach. Do not use any sheets or blankets that are too big for your bed. They should not hang down onto the floor. Have a firm chair that has side arms. You can use this for support while you get dressed. Do not have throw rugs and other things on the floor that can make you trip. What can I do in the kitchen? Clean up any spills right away. Avoid walking on wet floors. Keep items that you use a lot in easy-to-reach places. If you need to reach something above you, use a strong step stool that has a grab bar. Keep electrical cords out of the way. Do not use floor polish or wax that makes floors slippery. If you must use wax, use non-skid floor wax. Do not have throw rugs and other things on the floor that can make you trip. What can I do with my stairs? Do not leave any items on the stairs. Make sure that there are handrails on both sides of the stairs and use them. Fix handrails that are broken or loose. Make sure that handrails are as long as the stairways. Check any carpeting to make sure that it is firmly attached to the stairs. Fix any carpet that is loose or worn. Avoid having throw rugs at the top or  bottom of the stairs. If you do have throw rugs, attach them to the floor with carpet tape. Make sure that you have a light switch at the top of the stairs and the bottom of the stairs. If you do not have them, ask someone to add them for you. What else can I do to help prevent falls? Wear shoes that: Do not have high heels. Have rubber bottoms. Are comfortable and fit you well. Are closed at the toe. Do not wear sandals. If you use a stepladder: Make sure that it is fully opened. Do not climb a closed stepladder. Make sure that both sides of the stepladder are locked into place. Ask someone to hold it for you, if possible. Clearly mark and make sure that you can  see: Any grab bars or handrails. First and last steps. Where the edge of each step is. Use tools that help you move around (mobility aids) if they are needed. These include: Canes. Walkers. Scooters. Crutches. Turn on the lights when you go into a dark area. Replace any light bulbs as soon as they burn out. Set up your furniture so you have a clear path. Avoid moving your furniture around. If any of your floors are uneven, fix them. If there are any pets around you, be aware of where they are. Review your medicines with your doctor. Some medicines can make you feel dizzy. This can increase your chance of falling. Ask your doctor what other things that you can do to help prevent falls. This information is not intended to replace advice given to you by your health care provider. Make sure you discuss any questions you have with your health care provider. Document Released: 10/02/2009 Document Revised: 05/13/2016 Document Reviewed: 01/10/2015 Elsevier Interactive Patient Education  2017 [employer].

## 2022-01-12 NOTE — Progress Notes (Signed)
I connected with Kristen Cruz today by telephone and verified that I am speaking with the correct person using two identifiers. Location patient: home Location provider: work Persons participating in the virtual visit: Stefani Dama LPN.   I discussed the limitations, risks, security and privacy concerns of performing an evaluation and management service by telephone and the availability of in person appointments. I also discussed with the patient that there may be a patient responsible charge related to this service. The patient expressed understanding and verbally consented to this telephonic visit.    Interactive audio and video telecommunications were attempted between this provider and patient, however failed, due to patient having technical difficulties OR patient did not have access to video capability.  We continued and completed visit with audio only.     Vital signs may be patient reported or missing.  Subjective:   Kristen Cruz is a 86 y.o. female who presents for Medicare Annual (Subsequent) preventive examination.  Review of Systems     Cardiac Risk Factors include: advanced age (>38men, >31 women);hypertension     Objective:    Today's Vitals   01/12/22 1542  Weight: 86 lb (39 kg)  Height: 5\' 2"  (1.575 m)   Body mass index is 15.73 kg/m.  Advanced Directives 01/12/2022 02/10/2021 12/29/2020 04/29/2020 04/11/2020 12/31/2019 06/21/2019  Does Patient Have a Medical Advance Directive? Yes Yes Yes Yes Yes Yes No  Type of Advance Directive Living will [place];Living will [place];Living will [place];Living will [place];Living will [place];Living will -  Does patient want to make changes to medical advance directive? - No - Patient declined No - Patient declined No - Patient declined - No - Patient declined -  Copy of [place] in  Chart? - No - copy requested No - copy requested No - copy requested - No - copy requested -  Would patient like information on creating a medical advance directive? - - - - - - No - Patient declined    Current Medications (verified) Outpatient Encounter Medications as of 01/12/2022  Medication Sig   apixaban (ELIQUIS) 2.5 MG TABS tablet Take 1 tablet (2.5 mg total) by mouth 2 (two) times daily.   Cyanocobalamin (VITAMIN B-12 PO) Take 1,000 mg by mouth daily.   diltiazem (DILT-XR) 180 MG 24 hr capsule TAKE 1 CAPSULE BY MOUTH EVERY DAY   RESTASIS 0.05 % ophthalmic emulsion 1 drop 2 (two) times daily.   vitamin C (ASCORBIC ACID) 250 MG tablet Take 1 tablet (250 mg total) by mouth daily.   No facility-administered encounter medications on file as of 01/12/2022.    Allergies (verified) Asa [aspirin], Bentyl [dicyclomine hcl], Sucrets [dyclonine], Sulfa antibiotics, Tetracycline hcl, and Tylenol [acetaminophen]   History: Past Medical History:  Diagnosis Date   Anticoagulant long-term use    eliquis   Arthritis    Colon cancer ([place])    History of GI bleed 09/22/2018   upper   HOH (hard of hearing)    Hypertension    IDA (iron deficiency anemia)    PAF (paroxysmal atrial fibrillation) [facility]) cardiologist-- dr Radford Pax   CHADS2VASC score is 4 on Apixaban   PONV (postoperative nausea and vomiting)    Rash    "allergic to my on body tempurature , if I have fever I break out in a rash"   Right nephrolithiasis    per CT 09-22-2018   Wears dentures  upper   Wears glasses    Past Surgical History:  Procedure Laterality Date   ABDOMINAL HYSTERECTOMY  1978   ovaries remain   BREAST CYST EXCISION Right yrs ago   benign   CATARACT EXTRACTION W/ INTRAOCULAR LENS  IMPLANT, BILATERAL  2014  approx.   COLONOSCOPY  01-22-2019   dr Watt Climes   PARTIAL COLECTOMY Right 02/06/2019   Procedure: OPEN RIGHT COLECTOMY;  Surgeon: Armandina Gemma, MD;  Location: [facility];  Service: General;  Laterality: Right;    Family History  Problem Relation Age of Onset   Hypertension Mother    Heart Problems Mother    Heart attack Mother    Cancer Sister    Cancer Brother    Social History   Socioeconomic History   Marital status: Widowed    Spouse name: Not on file   Number of children: Not on file   Years of education: Not on file   Highest education level: Not on file  Occupational History   Not on file  Tobacco Use   Smoking status: Never   Smokeless tobacco: Never  Vaping Use   Vaping Use: Never used  Substance and Sexual Activity   Alcohol use: No   Drug use: Never   Sexual activity: Not Currently  Other Topics Concern   Not on file  Social History Narrative   Not on file   Social Determinants of Health   Financial Resource Strain: Low Risk    Difficulty of Paying Living Expenses: Not hard at all  Food Insecurity: No Food Insecurity   Worried About Running Out of Food in the Last Year: Never true   [place] in the Last Year: Never true  Transportation Needs: No Transportation Needs   Lack of Transportation (Medical): No   Lack of Transportation (Non-Medical): No  Physical Activity: Inactive   Days of Exercise per Week: 0 days   Minutes of Exercise per Session: 0 min  Stress: No Stress Concern Present   Feeling of Stress : Only a little  Social Connections: Socially Isolated   Frequency of Communication with Friends and Family: More than three times a week   Frequency of Social Gatherings with Friends and Family: Twice a week   Attends Religious Services: Never   Marine scientist or Organizations: No   Attends Archivist Meetings: Never   Marital Status: Widowed    Tobacco Counseling Counseling given: Not Answered   Clinical Intake:  Pre-visit preparation completed: Yes  Pain : No/denies pain     Nutritional Status: BMI <19  Underweight Nutritional Risks: None Diabetes: No  How often do you need to have someone help you when you read  instructions, pamphlets, or other written materials from your doctor or pharmacy?: 1 - Never  Diabetic? no  Interpreter Needed?: No  Information entered by :: NAllen LPN   Activities of Daily Living In your present state of health, do you have any difficulty performing the following activities: 01/12/2022 02/10/2021  Hearing? Tempie Donning  Comment has hearing aides has bilateral hearing aids  Vision? Y N  Comment sometimes -  Difficulty concentrating or making decisions? N N  Walking or climbing stairs? N N  Dressing or bathing? N N  Doing errands, shopping? N N  Preparing Food and eating ? N N  Using the Toilet? N N  In the past six months, have you accidently leaked urine? N N  Do you have problems with loss of  bowel control? N N  Managing your Medications? N N  Managing your Finances? N N  Housekeeping or managing your Housekeeping? N N  Some recent data might be hidden    Patient Care Team: Eulas Post, MD as PCP - General (Family Medicine) Sueanne Margarita, MD as PCP - Cardiology (Cardiology) Armandina Gemma, MD as Consulting Physician (General Surgery) Clarene Essex, MD as Consulting Physician (Gastroenterology) Ladell Pier, MD as Consulting Physician (Oncology)  Indicate any recent Medical Services you may have received from other than [facility] providers in the past year (date may be approximate).     Assessment:   This is a routine wellness examination for Marjoria.  Hearing/Vision screen Vision Screening - Comments:: Regular eye exams,   Dietary issues and exercise activities discussed: Current Exercise Habits: The patient does not participate in regular exercise at present   Goals Addressed             This Visit's Progress    Patient Stated       01/12/2022. No goals       Depression Screen PHQ 2/9 Scores 01/12/2022 02/10/2021 12/15/2018  PHQ - 2 Score 0 1 0  PHQ- 9 Score - 1 0    Fall Risk Fall Risk  01/12/2022 02/10/2021 07/19/2019 12/15/2018  Falls  in the past year? 0 1 0 0  Comment - slipped on and item Emmi Telephone Survey: data to providers prior to load -  Number falls in past yr: - 0 - -  Injury with Fall? - 0 - -  Risk for fall due to : Medication side effect No Fall Risks - -  Follow up Falls evaluation completed;Education provided;Falls prevention discussed Falls evaluation completed;Falls prevention discussed - -    FALL RISK PREVENTION PERTAINING TO THE HOME:  Any stairs in or around the home? Yes  If so, are there any without handrails? No  Home free of loose throw rugs in walkways, pet beds, electrical cords, etc? Yes  Adequate lighting in your home to reduce risk of falls? Yes   ASSISTIVE DEVICES UTILIZED TO PREVENT FALLS:  Life alert? No  Use of a cane, walker or w/c? No  Grab bars in the bathroom? No  Shower chair or bench in shower? Yes  Elevated toilet seat or a handicapped toilet? No   TIMED UP AND GO:  Was the test performed? No .      Cognitive Function:        Immunizations Immunization History  Administered Date(s) Administered   PFIZER(Purple Top)SARS-COV-2 Vaccination 02/29/2020, 03/21/2020    TDAP status: Due, Education has been provided regarding the importance of this vaccine. Advised may receive this vaccine at local pharmacy or Health Dept. Aware to provide a copy of the vaccination record if obtained from local pharmacy or Health Dept. Verbalized acceptance and understanding.  Flu Vaccine status: Declined, Education has been provided regarding the importance of this vaccine but patient still declined. Advised may receive this vaccine at local pharmacy or Health Dept. Aware to provide a copy of the vaccination record if obtained from local pharmacy or Health Dept. Verbalized acceptance and understanding.  Pneumococcal vaccine status: Declined,  Education has been provided regarding the importance of this vaccine but patient still declined. Advised may receive this vaccine at local  pharmacy or Health Dept. Aware to provide a copy of the vaccination record if obtained from local pharmacy or Health Dept. Verbalized acceptance and understanding.   Covid-19 vaccine status: Completed vaccines  Qualifies for Shingles Vaccine? Yes   Zostavax completed No   Shingrix Completed?: No.    Education has been provided regarding the importance of this vaccine. Patient has been advised to call insurance company to determine out of pocket expense if they have not yet received this vaccine. Advised may also receive vaccine at local pharmacy or Health Dept. Verbalized acceptance and understanding.  Screening Tests Health Maintenance  Topic Date Due   DEXA SCAN  Never done   COVID-19 Vaccine (3 - Pfizer risk series) 01/28/2022 (Originally 04/18/2020)   INFLUENZA VACCINE  03/19/2022 (Originally 07/20/2021)   Zoster Vaccines- Shingrix (1 of 2) 04/12/2022 (Originally 04/10/1955)   Pneumonia Vaccine 66+ Years old (1 - PCV) 01/12/2023 (Originally 04/10/1942)   TETANUS/TDAP  01/12/2023 (Originally 04/10/1955)   HPV VACCINES  Aged Out    Health Maintenance  Health Maintenance Due  Topic Date Due   DEXA SCAN  Never done    Colorectal cancer screening: No longer required.   Mammogram status: No longer required due to age.  Bone Density status: decline  Lung Cancer Screening: (Low Dose CT Chest recommended if Age 59-80 years, 30 pack-year currently smoking OR have quit w/in 15years.) does not qualify.   Lung Cancer Screening Referral: no  Additional Screening:  Hepatitis C Screening: does not qualify;  Vision Screening: Recommended annual ophthalmology exams for early detection of glaucoma and other disorders of the eye. Is the patient up to date with their annual eye exam?  Yes  Who is the provider or what is the name of the office in which the patient attends annual eye exams? Can't remember name If pt is not established with a provider, would they like to be referred to a provider  to establish care? No .   Dental Screening: Recommended annual dental exams for proper oral hygiene  Community Resource Referral / Chronic Care Management: CRR required this visit?  No   CCM required this visit?  No      Plan:     I have personally reviewed and noted the following in the patients chart:   Medical and social history Use of alcohol, tobacco or illicit drugs  Current medications and supplements including opioid prescriptions.  Functional ability and status Nutritional status Physical activity Advanced directives List of other physicians Hospitalizations, surgeries, and ER visits in previous 12 months Vitals Screenings to include cognitive, depression, and falls Referrals and appointments  In addition, I have reviewed and discussed with patient certain preventive protocols, quality metrics, and best practice recommendations. A written personalized care plan for preventive services as well as general preventive health recommendations were provided to patient.     Kellie Simmering, LPN   [id number]   Nurse Notes: 6 CIT not administered. Patient appeared cognitive during conversation.

## 2022-03-30 ENCOUNTER — Ambulatory Visit (HOSPITAL_BASED_OUTPATIENT_CLINIC_OR_DEPARTMENT_OTHER)
Admission: RE | Admit: 2022-03-30 | Discharge: 2022-03-30 | Disposition: A | Discharge status: Home / Self Care | Payer: Medicare Other | Source: Ambulatory Visit | Attending: Oncology | Admitting: Oncology

## 2022-03-30 DIAGNOSIS — C182 Malignant neoplasm of ascending colon: Secondary | ICD-10-CM | POA: Diagnosis present

## 2022-03-30 LAB — POCT I-STAT CREATININE: Creatinine, Ser: 0.8 mg/dL (ref 0.44–1.00)

## 2022-03-30 MED ORDER — IOHEXOL 300 MG/ML  SOLN
100.0000 mL | Freq: Once | INTRAMUSCULAR | Status: AC | PRN
  Administered 2022-03-30: 75 mL via INTRAVENOUS

## 2022-04-05 ENCOUNTER — Inpatient Hospital Stay: Payer: Medicare Other | Attending: Oncology

## 2022-04-05 DIAGNOSIS — C180 Malignant neoplasm of cecum: Secondary | ICD-10-CM | POA: Diagnosis present

## 2022-04-05 DIAGNOSIS — R911 Solitary pulmonary nodule: Secondary | ICD-10-CM | POA: Diagnosis not present

## 2022-04-05 DIAGNOSIS — C182 Malignant neoplasm of ascending colon: Secondary | ICD-10-CM

## 2022-04-05 LAB — CEA (ACCESS): CEA (CHCC): 1.23 ng/mL (ref 0.00–5.00)

## 2022-04-05 LAB — BASIC METABOLIC PANEL - CANCER CENTER ONLY
Anion gap: 10 (ref 5–15)
BUN: 10 mg/dL (ref 8–23)
CO2: 26 mmol/L (ref 22–32)
Calcium: 9.4 mg/dL (ref 8.9–10.3)
Chloride: 102 mmol/L (ref 98–111)
Creatinine: 0.75 mg/dL (ref 0.44–1.00)
GFR, Estimated: >60 mL/min (ref >60)
Glucose, Bld: 129 mg/dL — ABNORMAL HIGH (ref 70–99)
Potassium: 4.1 mmol/L (ref 3.5–5.1)
Sodium: 138 mmol/L (ref 135–145)

## 2022-04-07 ENCOUNTER — Telehealth: Payer: Self-pay

## 2022-04-07 ENCOUNTER — Encounter: Payer: Self-pay | Admitting: Nurse Practitioner

## 2022-04-07 ENCOUNTER — Inpatient Hospital Stay (HOSPITAL_BASED_OUTPATIENT_CLINIC_OR_DEPARTMENT_OTHER): Payer: Medicare Other | Admitting: Nurse Practitioner

## 2022-04-07 VITALS — BP 147/80 | HR 87 | Temp 98.2°F | Resp 18 | Ht 62.0 in | Wt 88.2 lb

## 2022-04-07 DIAGNOSIS — C182 Malignant neoplasm of ascending colon: Secondary | ICD-10-CM | POA: Diagnosis not present

## 2022-04-07 DIAGNOSIS — C180 Malignant neoplasm of cecum: Secondary | ICD-10-CM | POA: Diagnosis not present

## 2022-04-07 NOTE — Telephone Encounter (Signed)
Called and spoke with Reuben Likes at [facility] about the patient colonoscope. Reuben Likes stated the patient last colonoscope was on 04/01/20 and the provider recommend the next colonoscope 3 to 5 year.  ?

## 2022-04-07 NOTE — Progress Notes (Signed)
?  [place] ?OFFICE PROGRESS NOTE ? ? ?Diagnosis: Colon cancer ? ?INTERVAL HISTORY:  ? ?Kristen Cruz returns as scheduled.  No change in bowel habits.  No bleeding.  Appetite is better.  She has gained some weight since her last visit.  No abdominal pain. ? ?Objective: ? ?Vital signs in last 24 hours: ? ?Blood pressure (!) 147/80, pulse 87, temperature 98.2 ?F (36.8 ?C), temperature source Oral, resp. rate 18, height _0  (1.575 m), weight 88 lb 3.2 oz (40 kg), SpO2 99 %. ?  ? ?Lymphatics: No palpable cervical, supraclavicular, axillary or inguinal lymph nodes. ?Resp: Lungs clear bilaterally. ?Cardio: Regular rate and rhythm. ?GI: Abdomen soft and nontender.  No hepatosplenomegaly.  No mass. ?Vascular: No leg edema. ?Skin: Resolving ecchymosis left antecubital region at site of recent phlebotomy. ? ? ?Lab Results: ? ?Lab Results  ?Component Value Date  ? WBC 8.0 11/12/2020  ? HGB 12.6 11/12/2020  ? HCT 39.7 11/12/2020  ? MCV 94 11/12/2020  ? PLT 281 11/12/2020  ? NEUTROABS 5.9 04/11/2020  ? ? ?Imaging: ? ?No results found. ? ?Medications: I have reviewed the patient's current medications. ? ?Assessment/Plan: ?Colon cancer, cecum, stage IIIc (T3N2), status post a right colectomy 02/06/2019 ?4/24 lymph nodes positive, 1 tumor deposit, MSI-stable, no loss of mismatch repair protein expression ?CT abdomen/pelvis 09/22/2018-no acute finding, 10 mm right hepatic dome hypodensity-suspected cyst, nonobstructing right nephrolithiasis ?CT chest 03/28/2019- perifissural right middle lobe nodule-potentially atelectasis or scarring ?Cycle 1 adjuvant Xeloda 03/12/2019 ?Cycle 2 adjuvant Xeloda 04/02/2019 ?Cycle 3 adjuvant Xeloda 04/23/2019 ?Cycle 4 adjuvant Xeloda 05/14/2019 ?Cycle 5 adjuvant Xeloda 06/04/2019 ?Cycle 6 adjuvant Xeloda 06/25/2019 ?Cycle 7 adjuvant Xeloda 07/16/2019  ?Cycle 8 adjuvant Xeloda 08/02/2019 ?CTs 03/24/2020-no evidence of metastatic disease, stable right middle lobe subpleural nodule ?CTs 03/27/2021-no  definitive findings to suggest metastatic disease in the chest, abdomen or pelvis.  Previously noted subpleural nodule in the right middle lobe appears slightly larger than prior studies currently measuring 9 x 8 mm.  Nodule is predominantly groundglass attenuation within internal air bronchograms, does appear to cause some mild retraction of the minor fissure.  5 mm subpleural nodule in the periphery of the right upper lobe, stable in retrospect compared to the prior examination and considered benign.  No other suspicious appearing pulmonary nodule or mass noted. ?CTs 03/30/2022-stable right-sided pulmonary nodules.  Recommend continued surveillance of the groundglass type right middle lobe lesion.  2 small low-attenuation liver lesions are stable. ?Postoperative ileus-resolved following NG tube decompression ?History of iron deficiency anemia secondary to #1 ?Atrial fibrillation-maintained on apixaban ?Family history of colon and esophagus cancer ?7 mm right middle lobe nodule on chest CT 03/28/2019-plan for repeat CT at a 3 to 31-monthinterval ?08/23/2019 chest CT -right middle lobe nodule, smaller, flat/platelike-repeat CT 12-18 months suggested ?CT chest 03/24/2020-unchanged right middle lobe subpleural nodule ?CT chest 03/27/2021-previously noted subpleural nodule right middle lobe appears slightly larger ?CT chest 03/30/2022-stable subsolid nodular lesion in the right middle lobe ?  ? ?Disposition: Ms. SBohleremains in remission from colon cancer.  CEA is stable in normal range.  Recent CT scans show stable right-sided lung nodules with recommended continued surveillance of a right middle lobe lesion.  Plan for follow-up noncontrast chest CT in 1 year. ? ?She will return for CEA and follow-up visit in 6 months.  We are available to see her sooner if needed. ? ? ? ?LNed CardANP/GNP-BC  ? ?04/07/2022  ?11:04 AM ? ? ? ? ? ? ? ?

## 2022-10-07 ENCOUNTER — Inpatient Hospital Stay: Payer: Medicare Other | Admitting: Oncology

## 2022-10-07 ENCOUNTER — Inpatient Hospital Stay: Payer: Medicare Other

## 2022-10-14 ENCOUNTER — Inpatient Hospital Stay: Payer: Medicare Other | Attending: Oncology

## 2022-10-14 ENCOUNTER — Inpatient Hospital Stay (HOSPITAL_BASED_OUTPATIENT_CLINIC_OR_DEPARTMENT_OTHER): Payer: Medicare Other | Admitting: Nurse Practitioner

## 2022-10-14 ENCOUNTER — Encounter: Payer: Self-pay | Admitting: Nurse Practitioner

## 2022-10-14 VITALS — BP 144/75 | HR 100 | Temp 98.1°F | Resp 18 | Ht 62.0 in | Wt 85.2 lb

## 2022-10-14 DIAGNOSIS — R918 Other nonspecific abnormal finding of lung field: Secondary | ICD-10-CM | POA: Insufficient documentation

## 2022-10-14 DIAGNOSIS — C182 Malignant neoplasm of ascending colon: Secondary | ICD-10-CM

## 2022-10-14 DIAGNOSIS — C180 Malignant neoplasm of cecum: Secondary | ICD-10-CM | POA: Diagnosis present

## 2022-10-14 LAB — CEA (ACCESS): CEA (CHCC): 1.34 ng/mL (ref 0.00–5.00)

## 2022-10-14 NOTE — Progress Notes (Signed)
  [facility] OFFICE PROGRESS NOTE   Diagnosis: Colon cancer  INTERVAL HISTORY:   Kristen Cruz returns as scheduled.  She feels well.  No change in bowel habits.  No blood with bowel movements.  She reports a good appetite, stable weight.  Objective:  Vital signs in last 24 hours:  Blood pressure (!) 144/75, pulse 100, temperature 98.1 F (36.7 C), temperature source Oral, resp. rate 18, height 5' 2" (1.575 m), weight 85 lb 3.2 oz (38.6 kg), SpO2 98 %.    Lymphatics: No palpable cervical, supraclavicular or axillary lymph nodes.  Shotty bilateral inguinal nodes. Resp: Lungs clear bilaterally. Cardio: Regular rate and rhythm. GI: Abdomen soft and nontender.  No hepatosplenomegaly.  No mass. Vascular: No leg edema.   Lab Results:  Lab Results  Component Value Date   WBC 8.0 11/12/2020   HGB 12.6 11/12/2020   HCT 39.7 11/12/2020   MCV 94 11/12/2020   PLT 281 11/12/2020   NEUTROABS 5.9 04/11/2020    Imaging:  No results found.  Medications: I have reviewed the patient's current medications.  Assessment/Plan: Colon cancer, cecum, stage IIIc (T3N2), status post a right colectomy 02/06/2019 4/24 lymph nodes positive, 1 tumor deposit, MSI-stable, no loss of mismatch repair protein expression CT abdomen/pelvis 09/22/2018-no acute finding, 10 mm right hepatic dome hypodensity-suspected cyst, nonobstructing right nephrolithiasis CT chest 03/28/2019- perifissural right middle lobe nodule-potentially atelectasis or scarring Cycle 1 adjuvant Xeloda 03/12/2019 Cycle 2 adjuvant Xeloda 04/02/2019 Cycle 3 adjuvant Xeloda 04/23/2019 Cycle 4 adjuvant Xeloda 05/14/2019 Cycle 5 adjuvant Xeloda 06/04/2019 Cycle 6 adjuvant Xeloda 06/25/2019 Cycle 7 adjuvant Xeloda 07/16/2019  Cycle 8 adjuvant Xeloda 08/02/2019 CTs 03/24/2020-no evidence of metastatic disease, stable right middle lobe subpleural nodule CTs 03/27/2021-no definitive findings to suggest metastatic disease in the chest,  abdomen or pelvis.  Previously noted subpleural nodule in the right middle lobe appears slightly larger than prior studies currently measuring 9 x 8 mm.  Nodule is predominantly groundglass attenuation within internal air bronchograms, does appear to cause some mild retraction of the minor fissure.  5 mm subpleural nodule in the periphery of the right upper lobe, stable in retrospect compared to the prior examination and considered benign.  No other suspicious appearing pulmonary nodule or mass noted. CTs 03/30/2022-stable right-sided pulmonary nodules.  Recommend continued surveillance of the groundglass type right middle lobe lesion.  2 small low-attenuation liver lesions are stable. Postoperative ileus-resolved following NG tube decompression History of iron deficiency anemia secondary to #1 Atrial fibrillation-maintained on apixaban Family history of colon and esophagus cancer 7 mm right middle lobe nodule on chest CT 03/28/2019-plan for repeat CT at a 3 to 77-monthinterval 08/23/2019 chest CT -right middle lobe nodule, smaller, flat/platelike-repeat CT 12-18 months suggested CT chest 03/24/2020-unchanged right middle lobe subpleural nodule CT chest 03/27/2021-previously noted subpleural nodule right middle lobe appears slightly larger CT chest 03/30/2022-stable subsolid nodular lesion in the right middle lobe    Disposition: Ms. SButkiewiczremains in clinical remission from colon cancer.  CEA is stable in normal range.  She will have a noncontrast CT scan of the chest in approximately 6 months to follow-up lung nodules.    Plan for office visit a few days after the scan to review results with her.    LNed CardANP/GNP-BC   10/14/2022  12:37 PM

## 2022-11-22 ENCOUNTER — Other Ambulatory Visit: Payer: Self-pay | Admitting: Cardiology

## 2022-11-22 DIAGNOSIS — I480 Paroxysmal atrial fibrillation: Secondary | ICD-10-CM

## 2022-11-22 NOTE — Telephone Encounter (Signed)
Prescription refill request for Eliquis received. Indication: Afib  Last office visit: 11/16/21 Radford Pax)  Scr: 0.75 (04/05/22)  Age: 86 Weight: 38.6kg  Overdue to see cardiologist. Pt has appt with Dr Radford Pax on 12/31/22. Appropriate dose and refill sent to requested pharmacy.

## 2022-12-08 ENCOUNTER — Telehealth: Payer: Self-pay | Admitting: Family Medicine

## 2022-12-08 NOTE — Telephone Encounter (Signed)
Left message for patient to call back and schedule Medicare Annual Wellness Visit (AWV) either virtually or in office. Left  my Kristen Cruz number [phone]   Last AWV 01/12/22 please schedule with Nurse Health Adviser   45 min for awv-i and in office appointments 30 min for awv-s  phone/virtual appointments

## 2022-12-17 ENCOUNTER — Other Ambulatory Visit: Payer: Self-pay | Admitting: Cardiology

## 2022-12-31 ENCOUNTER — Encounter: Payer: Self-pay | Admitting: Cardiology

## 2022-12-31 ENCOUNTER — Ambulatory Visit: Payer: Medicare Other | Attending: Cardiology | Admitting: Cardiology

## 2022-12-31 VITALS — BP 150/70 | HR 111 | Ht 62.0 in | Wt 87.2 lb

## 2022-12-31 DIAGNOSIS — I480 Paroxysmal atrial fibrillation: Secondary | ICD-10-CM

## 2022-12-31 DIAGNOSIS — I10 Essential (primary) hypertension: Secondary | ICD-10-CM

## 2022-12-31 NOTE — Progress Notes (Signed)
Cardiology Office Note:    Date:  12/31/2022   ID:  Kristen Cruz, DOB 1936/02/04, MRN [id number]  PCP:  Eulas Post, MD  Cardiologist:  Fransico Him, MD   Electrophysiologist:  None   Referring MD: Eulas Post, MD   Chief Complaint:  Atrial Fibrillation and Hypertension    Patient Profile:    Kristen Cruz is a 87 y.o. female with:  Paroxysmal atrial fibrillation  CHA2DS2-VASc=4 (female, age x 2, >> Apixaban) Hypertension  Coronary Ca noted on CT (08/2019) Colon CA s/p R colectomy in 01/2019 Fe deficiency anemia  Prior CV studies: Echocardiogram 03/08/14 EF 55-65, no RWMA  History of Present Illness:    Kristen Cruz  is here today for followup and is doing well.   She denies any chest pain or pressure, SOB, DOE, PND, orthopnea, dizziness, palpitations or syncope. She occasionally will have LE edema in the summer.  She is compliant with her meds and is tolerating meds with no SE.    Past Medical History:  Diagnosis Date   Anticoagulant long-term use    eliquis   Arthritis    Colon cancer ([place])    History of GI bleed 09/22/2018   upper   HOH (hard of hearing)    Hypertension    IDA (iron deficiency anemia)    PAF (paroxysmal atrial fibrillation) [facility]) cardiologist-- dr Rita Ohara score is 4 on Apixaban   PONV (postoperative nausea and vomiting)    Rash    "allergic to my on body tempurature , if I have fever I break out in a rash"   Right nephrolithiasis    per CT 09-22-2018   Wears dentures    upper   Wears glasses     Current Medications: Current Meds  Medication Sig   apixaban (ELIQUIS) 2.5 MG TABS tablet TAKE 1 TABLET BY MOUTH TWICE A DAY   Cyanocobalamin (VITAMIN B-12 PO) Take 1,000 mg by mouth daily.   diltiazem (DILT-XR) 180 MG 24 hr capsule TAKE 1 CAPSULE BY MOUTH EVERY DAY. Please keep upcoming appointment for future refills. Thank you.   RESTASIS 0.05 % ophthalmic emulsion 1 drop 2 (two) times daily.   vitamin C  (ASCORBIC ACID) 250 MG tablet Take 1 tablet (250 mg total) by mouth daily.     Allergies:   Asa [aspirin], Bentyl [dicyclomine hcl], Sucrets [dyclonine], Sulfa antibiotics, Tetracycline hcl, and Tylenol [acetaminophen]   Social History   Tobacco Use   Smoking status: Never   Smokeless tobacco: Never  Vaping Use   Vaping Use: Never used  Substance Use Topics   Alcohol use: No   Drug use: Never     Family Hx: The patient's family history includes Cancer in her brother and sister; Heart Problems in her mother; Heart attack in her mother; Hypertension in her mother.  ROS   EKGs/Labs/Other Test Reviewed:    EKG:  EKG is ordered today.   Recent Labs: 04/05/2022: BUN 10; Creatinine 0.75; Potassium 4.1; Sodium 138   Recent Lipid Panel No results found for: "CHOL", "TRIG", "HDL", "CHOLHDL", "LDLCALC", "LDLDIRECT"  Physical Exam:    VS:  BP (!) 150/70   Pulse (!) 111   Ht '5\' 2"'$  (1.575 m)   Wt 87 lb 3.2 oz (39.6 kg)   SpO2 95%   BMI 15.95 kg/m     Wt Readings from Last 3 Encounters:  12/31/22 87 lb 3.2 oz (39.6 kg)  10/14/22 85 lb 3.2 oz (38.6 kg)  04/07/22 88 lb 3.2 oz (40 kg)    GEN: Well nourished, well developed in no acute distress HEENT: Normal NECK: No JVD; No carotid bruits LYMPHATICS: No lymphadenopathy CARDIAC:RRR, no murmurs, rubs, gallops RESPIRATORY:  Clear to auscultation without rales, wheezing or rhonchi  ABDOMEN: Soft, non-tender, non-distended MUSCULOSKELETAL:  No edema; No deformity  SKIN: Warm and dry NEUROLOGIC:  Alert and oriented x 3 PSYCHIATRIC:  Normal affect   EKG performed today demonstrates sinus tachycardia with PACs , iRBBB, LAFB, LVH by voltage, LAD, BAE  ASSESSMENT & PLAN:    1.  Paroxysmal atrial fibrillation (HCC) -She is maintaining normal sinus rhythm on exam today and denies any palpitations or bleeding problems on DOAC -HR is elevated but has been in the past in the office>>3 day ziopatch which showed normal HR>>she gets  anxious when she comes to the office and HR goes up  -Continue prescription drug management with Cardizem XR 180 mg daily and apixaban 2.5 mg twice daily (dosed for age greater than 61 and weight less than 60 kg) with as needed refills -check BMET and CBC  2. Essential hypertension, benign -BP is borderline controlled on exam today>>she has documented white coat HTN at home. -Continue Cardizem XR 180 mg daily with as needed refills   Dispo: followup 1 year  Medication Adjustments/Labs and Tests Ordered: Current medicines are reviewed at length with the patient today.  Concerns regarding medicines are outlined above.  Tests Ordered: Orders Placed This Encounter  Procedures   EKG 12-Lead     Medication Changes: No orders of the defined types were placed in this encounter.   Signed, Fransico Him, MD  12/31/2022 11:30 AM    [facility] [place], [place], [place]  [address] Phone: [phone]; Fax: [phone]

## 2022-12-31 NOTE — Addendum Note (Signed)
Addended by: Joni Reining on: 12/31/2022 11:39 AM   Modules accepted: Orders

## 2022-12-31 NOTE — Patient Instructions (Signed)
Medication Instructions:  Your physician recommends that you continue on your current medications as directed. Please refer to the Current Medication list given to you today.  *If you need a refill on your cardiac medications before your next appointment, please call your pharmacy*   Lab Work: Please complete a CBC and BMET in our lab before you leave today.  If you have labs (blood work) drawn today and your tests are completely normal, you will receive your results only by: [place] (if you have MyChart) OR A paper copy in the mail If you have any lab test that is abnormal or we need to change your treatment, we will call you to review the results.   Testing/Procedures: None.   Follow-Up:  Your next appointment:   1 year(s)  Provider:   Fransico Him, MD

## 2023-01-01 LAB — BASIC METABOLIC PANEL
BUN/Creatinine Ratio: 14 (ref 12–28)
BUN: 10 mg/dL (ref 8–27)
CO2: 23 mmol/L (ref 20–29)
Calcium: 9.3 mg/dL (ref 8.7–10.3)
Chloride: 102 mmol/L (ref 96–106)
Creatinine, Ser: 0.69 mg/dL (ref 0.57–1.00)
Glucose: 106 mg/dL — ABNORMAL HIGH (ref 70–99)
Potassium: 4.4 mmol/L (ref 3.5–5.2)
Sodium: 140 mmol/L (ref 134–144)
eGFR: 86 mL/min/{1.73_m2} (ref >59)

## 2023-01-01 LAB — CBC WITH DIFFERENTIAL/PLATELET
Basophils Absolute: 0 10*3/uL (ref 0.0–0.2)
Basos: 0 %
EOS (ABSOLUTE): 0 10*3/uL (ref 0.0–0.4)
Eos: 0 %
Hematocrit: 38.7 % (ref 34.0–46.6)
Hemoglobin: 12.6 g/dL (ref 11.1–15.9)
Immature Grans (Abs): 0 10*3/uL (ref 0.0–0.1)
Immature Granulocytes: 0 %
Lymphocytes Absolute: 1 10*3/uL (ref 0.7–3.1)
Lymphs: 11 %
MCH: 29.3 pg (ref 26.6–33.0)
MCHC: 32.6 g/dL (ref 31.5–35.7)
MCV: 90 fL (ref 79–97)
Monocytes Absolute: 0.7 10*3/uL (ref 0.1–0.9)
Monocytes: 8 %
Neutrophils Absolute: 7.3 10*3/uL — ABNORMAL HIGH (ref 1.4–7.0)
Neutrophils: 81 %
Platelets: 292 10*3/uL (ref 150–450)
RBC: 4.3 x10E6/uL (ref 3.77–5.28)
RDW: 12.6 % (ref 11.7–15.4)
WBC: 9.1 10*3/uL (ref 3.4–10.8)

## 2023-01-05 ENCOUNTER — Telehealth: Payer: Self-pay

## 2023-01-05 NOTE — Telephone Encounter (Signed)
-----  Message from Nuala Alpha, LPN sent at 8/45/7334 10:08 AM EST -----  ----- Message ----- From: Sueanne Margarita, MD Sent: 01/02/2023   6:52 PM EST To: Eulas Post, MD; Cv Div [facility] St Triage  Stable labs - continue current meds and forward to PCP

## 2023-01-05 NOTE — Telephone Encounter (Signed)
Lab results reviewed with patient who verbalized understanding. Labs routed to PCP Dr. Elease Hashimoto.

## 2023-03-17 ENCOUNTER — Other Ambulatory Visit: Payer: Self-pay | Admitting: Cardiology

## 2023-04-15 ENCOUNTER — Inpatient Hospital Stay: Payer: Medicare Other | Attending: Oncology

## 2023-04-15 ENCOUNTER — Ambulatory Visit (HOSPITAL_BASED_OUTPATIENT_CLINIC_OR_DEPARTMENT_OTHER)
Admission: RE | Admit: 2023-04-15 | Discharge: 2023-04-15 | Disposition: A | Discharge status: Home / Self Care | Payer: Medicare Other | Source: Ambulatory Visit | Attending: Nurse Practitioner | Admitting: Nurse Practitioner

## 2023-04-15 DIAGNOSIS — C182 Malignant neoplasm of ascending colon: Secondary | ICD-10-CM

## 2023-04-15 LAB — CEA (ACCESS): CEA (CHCC): 1.18 ng/mL (ref 0.00–5.00)

## 2023-04-20 ENCOUNTER — Inpatient Hospital Stay: Payer: Medicare Other | Admitting: Oncology

## 2023-04-27 ENCOUNTER — Inpatient Hospital Stay: Payer: Medicare Other | Attending: Nurse Practitioner | Admitting: Nurse Practitioner

## 2023-04-27 ENCOUNTER — Encounter: Payer: Self-pay | Admitting: Nurse Practitioner

## 2023-04-27 VITALS — BP 135/80 | HR 100 | Temp 98.2°F | Resp 18 | Ht 62.0 in | Wt 87.4 lb

## 2023-04-27 DIAGNOSIS — C180 Malignant neoplasm of cecum: Secondary | ICD-10-CM | POA: Diagnosis present

## 2023-04-27 DIAGNOSIS — C182 Malignant neoplasm of ascending colon: Secondary | ICD-10-CM

## 2023-04-27 DIAGNOSIS — R911 Solitary pulmonary nodule: Secondary | ICD-10-CM | POA: Insufficient documentation

## 2023-04-27 NOTE — Progress Notes (Signed)
[facility] OFFICE PROGRESS NOTE   Diagnosis: Colon cancer  INTERVAL HISTORY:   Kristen Cruz returns for follow-up.  Overall she is feeling well.  Bowels moving regularly.  Occasional rectal bleeding related to "hemorrhoids".  She had an episode of vertigo last week.  She is better now.  Objective:  Vital signs in last 24 hours:  Blood pressure 135/80, pulse 100, temperature 98.2 F (36.8 C), temperature source Oral, resp. rate 18, height 5\' 2"  (1.575 m), weight 87 lb 6.4 oz (39.6 kg), SpO2 98 %.    Lymphatics: No palpable cervical, supraclavicular or axillary lymph nodes.  Shotty bilateral inguinal nodes. Resp: Lungs clear bilaterally. Cardio: Regular rate and rhythm. GI: No hepatosplenomegaly. Vascular: No leg edema.   Lab Results:  Lab Results  Component Value Date   WBC 9.1 12/31/2022   HGB 12.6 12/31/2022   HCT 38.7 12/31/2022   MCV 90 12/31/2022   PLT 292 12/31/2022   NEUTROABS 7.3 (H) 12/31/2022    Imaging:  No results found.  Medications: I have reviewed the patient's current medications.  Assessment/Plan: Colon cancer, cecum, stage IIIc (T3N2), status post a right colectomy 02/06/2019 4/24 lymph nodes positive, 1 tumor deposit, MSI-stable, no loss of mismatch repair protein expression CT abdomen/pelvis 09/22/2018-no acute finding, 10 mm right hepatic dome hypodensity-suspected cyst, nonobstructing right nephrolithiasis CT chest 03/28/2019- perifissural right middle lobe nodule-potentially atelectasis or scarring Cycle 1 adjuvant Xeloda 03/12/2019 Cycle 2 adjuvant Xeloda 04/02/2019 Cycle 3 adjuvant Xeloda 04/23/2019 Cycle 4 adjuvant Xeloda 05/14/2019 Cycle 5 adjuvant Xeloda 06/04/2019 Cycle 6 adjuvant Xeloda 06/25/2019 Cycle 7 adjuvant Xeloda 07/16/2019  Cycle 8 adjuvant Xeloda 08/02/2019 CTs 03/24/2020-no evidence of metastatic disease, stable right middle lobe subpleural nodule CTs 03/27/2021-no definitive findings to suggest metastatic disease in the  chest, abdomen or pelvis.  Previously noted subpleural nodule in the right middle lobe appears slightly larger than prior studies currently measuring 9 x 8 mm.  Nodule is predominantly groundglass attenuation within internal air bronchograms, does appear to cause some mild retraction of the minor fissure.  5 mm subpleural nodule in the periphery of the right upper lobe, stable in retrospect compared to the prior examination and considered benign.  No other suspicious appearing pulmonary nodule or mass noted. CTs 03/30/2022-stable right-sided pulmonary nodules.  Recommend continued surveillance of the groundglass type right middle lobe lesion.  2 small low-attenuation liver lesions are stable. CT chest 04/15/2023-no metastatic disease.  Unchanged appearance of subsolid nodule within the right middle lobe measuring 9 mm, continued surveillance recommended; stable 5 mm right upper lung nodule. Postoperative ileus-resolved following NG tube decompression History of iron deficiency anemia secondary to #1 Atrial fibrillation-maintained on apixaban Family history of colon and esophagus cancer 7 mm right middle lobe nodule on chest CT 03/28/2019-plan for repeat CT at a 3 to 89-month interval 08/23/2019 chest CT -right middle lobe nodule, smaller, flat/platelike-repeat CT 12-18 months suggested CT chest 03/24/2020-unchanged right middle lobe subpleural nodule CT chest 03/27/2021-previously noted subpleural nodule right middle lobe appears slightly larger CT chest 03/30/2022-stable subsolid nodular lesion in the right middle lobe CT chest 04/15/2023-unchanged appearance of subsolid nodule within the right middle lobe measuring 9 mm    Disposition: Ms. Skwarek remains in clinical remission from colon cancer.  CEA is stable in normal range.  Recent follow-up chest CT showed the right middle lobe lung nodule is stable.  Plan continued surveillance.  She will return for a CEA and follow-up visit in 6 months.  We are  available to  see her sooner if needed.    Lonna Cobb ANP/GNP-BC   04/27/2023  1:47 PM

## 2023-05-21 HISTORY — PX: OTHER SURGICAL HISTORY: SHX169

## 2023-06-10 ENCOUNTER — Other Ambulatory Visit: Payer: Self-pay | Admitting: Cardiology

## 2023-06-10 DIAGNOSIS — I480 Paroxysmal atrial fibrillation: Secondary | ICD-10-CM

## 2023-06-10 NOTE — Telephone Encounter (Signed)
Prescription refill request for Eliquis received. Indication:afib Last office visit:1/24 Scr:0.69  1/24 Age: 87 Weight: 39.6  kg  Prescription refilled

## 2023-10-28 ENCOUNTER — Telehealth: Payer: Self-pay

## 2023-10-28 ENCOUNTER — Inpatient Hospital Stay (HOSPITAL_BASED_OUTPATIENT_CLINIC_OR_DEPARTMENT_OTHER): Payer: Medicare Other | Admitting: Oncology

## 2023-10-28 ENCOUNTER — Inpatient Hospital Stay: Payer: Medicare Other | Attending: Oncology

## 2023-10-28 VITALS — BP 118/84 | HR 100 | Temp 98.0°F | Resp 18 | Ht 62.0 in | Wt 84.8 lb

## 2023-10-28 DIAGNOSIS — C182 Malignant neoplasm of ascending colon: Secondary | ICD-10-CM

## 2023-10-28 DIAGNOSIS — I4891 Unspecified atrial fibrillation: Secondary | ICD-10-CM | POA: Insufficient documentation

## 2023-10-28 DIAGNOSIS — Z7901 Long term (current) use of anticoagulants: Secondary | ICD-10-CM | POA: Diagnosis not present

## 2023-10-28 DIAGNOSIS — R911 Solitary pulmonary nodule: Secondary | ICD-10-CM | POA: Diagnosis not present

## 2023-10-28 DIAGNOSIS — C180 Malignant neoplasm of cecum: Secondary | ICD-10-CM | POA: Insufficient documentation

## 2023-10-28 DIAGNOSIS — K769 Liver disease, unspecified: Secondary | ICD-10-CM | POA: Insufficient documentation

## 2023-10-28 LAB — CEA (ACCESS): CEA (CHCC): 1.18 ng/mL (ref 0.00–5.00)

## 2023-10-28 NOTE — Telephone Encounter (Signed)
Patient gave verbal understanding and had no further questions or concerns  

## 2023-10-28 NOTE — Telephone Encounter (Signed)
-----   Message from Lonna Cobb sent at 10/28/2023  1:34 PM EST ----- Please let her know CEA is stable in normal range.

## 2023-10-28 NOTE — Progress Notes (Signed)
[facility] OFFICE PROGRESS NOTE   Diagnosis: Colon cancer  INTERVAL HISTORY:   Kristen Cruz returns as scheduled.  She feels well.  Good appetite.  No difficulty with bowel function.  No bleeding.  No complaint.  She had eye surgery this summer.  Objective:  Vital signs in last 24 hours:  Blood pressure 118/84, pulse 100, temperature 98 F (36.7 C), resp. rate 18, height 5\' 2"  (1.575 m), weight 84 lb 12.8 oz (38.5 kg), SpO2 100%.    Lymphatics: No cervical, supraclavicular, axillary, or inguinal nodes Resp: Lungs clear bilaterally Cardio: Regular rate and rhythm GI: No mass, nontender, no hepatosplenomegaly Vascular: No leg edema  Lab Results:  Lab Results  Component Value Date   WBC 9.1 12/31/2022   HGB 12.6 12/31/2022   HCT 38.7 12/31/2022   MCV 90 12/31/2022   PLT 292 12/31/2022   NEUTROABS 7.3 (H) 12/31/2022    CMP  Lab Results  Component Value Date   NA 140 12/31/2022   K 4.4 12/31/2022   CL 102 12/31/2022   CO2 23 12/31/2022   GLUCOSE 106 (H) 12/31/2022   BUN 10 12/31/2022   CREATININE 0.69 12/31/2022   CALCIUM 9.3 12/31/2022   PROT 7.6 04/11/2020   ALBUMIN 4.4 04/11/2020   AST 20 04/11/2020   ALT 14 04/11/2020   ALKPHOS 96 04/11/2020   BILITOT 0.7 04/11/2020   GFRNONAA >60 04/05/2022   GFRAA 92 11/12/2020    Lab Results  Component Value Date   CEA1 1.12 09/29/2021   CEA 1.18 10/28/2023    No results found for: "INR", "LABPROT"  Imaging:  No results found.  Medications: I have reviewed the patient's current medications.   Assessment/Plan:  Colon cancer, cecum, stage IIIc (T3N2), status post a right colectomy 02/06/2019 4/24 lymph nodes positive, 1 tumor deposit, MSI-stable, no loss of mismatch repair protein expression CT abdomen/pelvis 09/22/2018-no acute finding, 10 mm right hepatic dome hypodensity-suspected cyst, nonobstructing right nephrolithiasis CT chest 03/28/2019- perifissural right middle lobe nodule-potentially  atelectasis or scarring Cycle 1 adjuvant Xeloda 03/12/2019 Cycle 2 adjuvant Xeloda 04/02/2019 Cycle 3 adjuvant Xeloda 04/23/2019 Cycle 4 adjuvant Xeloda 05/14/2019 Cycle 5 adjuvant Xeloda 06/04/2019 Cycle 6 adjuvant Xeloda 06/25/2019 Cycle 7 adjuvant Xeloda 07/16/2019  Cycle 8 adjuvant Xeloda 08/02/2019 CTs 03/24/2020-no evidence of metastatic disease, stable right middle lobe subpleural nodule CTs 03/27/2021-no definitive findings to suggest metastatic disease in the chest, abdomen or pelvis.  Previously noted subpleural nodule in the right middle lobe appears slightly larger than prior studies currently measuring 9 x 8 mm.  Nodule is predominantly groundglass attenuation within internal air bronchograms, does appear to cause some mild retraction of the minor fissure.  5 mm subpleural nodule in the periphery of the right upper lobe, stable in retrospect compared to the prior examination and considered benign.  No other suspicious appearing pulmonary nodule or mass noted. CTs 03/30/2022-stable right-sided pulmonary nodules.  Recommend continued surveillance of the groundglass type right middle lobe lesion.  2 small low-attenuation liver lesions are stable. CT chest 04/15/2023-no metastatic disease.  Unchanged appearance of subsolid nodule within the right middle lobe measuring 9 mm, continued surveillance recommended; stable 5 mm right upper lung nodule. Postoperative ileus-resolved following NG tube decompression History of iron deficiency anemia secondary to #1 Atrial fibrillation-maintained on apixaban Family history of colon and esophagus cancer 7 mm right middle lobe nodule on chest CT 03/28/2019-plan for repeat CT at a 3 to 55-month interval 08/23/2019 chest CT -right middle lobe nodule, smaller, flat/platelike-repeat  CT 12-18 months suggested CT chest 03/24/2020-unchanged right middle lobe subpleural nodule CT chest 03/27/2021-previously noted subpleural nodule right middle lobe appears slightly larger CT chest  03/30/2022-stable subsolid nodular lesion in the right middle lobe CT chest 04/15/2023-unchanged appearance of subsolid nodule within the right middle lobe measuring 9 mm    Disposition: Kristen Cruz is in clinical remission from colon cancer.  She is now almost 5 years out from the right colectomy.  She does not plan to have further colonoscopy surveillance and does not wish to undergo further CT imaging.  She plans to continue clinical follow-up with Dr. Caryl Never and her cardiologist.  She is not scheduled for a follow-up appointment in the medical oncology clinic.  I am available to see her as needed.  Thornton Papas, MD  10/28/2023  11:10 AM

## 2023-12-28 ENCOUNTER — Other Ambulatory Visit: Payer: Self-pay | Admitting: Cardiology

## 2023-12-28 DIAGNOSIS — I480 Paroxysmal atrial fibrillation: Secondary | ICD-10-CM

## 2023-12-28 NOTE — Telephone Encounter (Signed)
 Prescription refill request for Eliquis received. Indication: Afib  Last office: 12/31/22 Mayford Knife)  Scr: 0.69 (12/31/22)  Age: 88 Weight: 38.5kg  Appropriate dose. Refill sent.

## 2023-12-28 NOTE — Telephone Encounter (Signed)
*  STAT* If patient is at the pharmacy, call can be transferred to refill team.   1. Which medications need to be refilled? (please list name of each medication and dose if known) new prescription for Eliquis    2. Would you like to learn more about the convenience, safety, & potential cost savings by using the [facility]?    3. Are you open to using the [facility] (Type [facility]..   4. Which pharmacy/location (including street and city if local pharmacy) is medication to be sent to?[facility] [place]   5. Do they need a 30 day or 90 day supply? 30 days # 60 and refills appointment with Dr Shlomo on 01-05-24

## 2024-01-05 ENCOUNTER — Encounter: Payer: Self-pay | Admitting: Cardiology

## 2024-01-05 ENCOUNTER — Ambulatory Visit: Payer: Medicare Other | Attending: Cardiology | Admitting: Cardiology

## 2024-01-05 VITALS — BP 158/72 | HR 107 | Ht 62.0 in | Wt 86.0 lb

## 2024-01-05 DIAGNOSIS — I480 Paroxysmal atrial fibrillation: Secondary | ICD-10-CM

## 2024-01-05 DIAGNOSIS — I10 Essential (primary) hypertension: Secondary | ICD-10-CM

## 2024-01-05 MED ORDER — METOPROLOL SUCCINATE ER 25 MG PO TB24: 25.0000 mg | ORAL_TABLET | Freq: Every day | ORAL | 3 refills | Status: DC

## 2024-01-05 NOTE — Addendum Note (Signed)
Addended by: Virl Axe, Asif Muchow L on: 01/05/2024 10:57 AM   Modules accepted: Orders

## 2024-01-05 NOTE — Patient Instructions (Addendum)
Medication Instructions:  Your physician has recommended you make the following change in your medication:  Added Metoprolol 25 mg once daily   *If you need a refill on your cardiac medications before your next appointment, please call your pharmacy*  Lab Work: CBC BMET  If you have labs (blood work) drawn today and your tests are completely normal, you will receive your results only by: MyChart Message (if you have MyChart) OR A paper copy in the mail If you have any lab test that is abnormal or we need to change your treatment, we will call you to review the results.  Testing/Procedures: None ordered today.  Follow-Up: At [facility], you and your health needs are our priority.  As part of our continuing mission to provide you with exceptional heart care, we have created designated Provider Care Teams.  These Care Teams include your primary Cardiologist (physician) and Advanced Practice Providers (APPs -  Physician Assistants and Nurse Practitioners) who all work together to provide you with the care you need, when you need it.  We recommend signing up for the patient portal called "MyChart".  Sign up information is provided on this After Visit Summary.  MyChart is used to connect with patients for Virtual Visits (Telemedicine).  Patients are able to view lab/test results, encounter notes, upcoming appointments, etc.  Non-urgent messages can be sent to your provider as well.   To learn more about what you can do with MyChart, go to [email or web address].    Your next appointment:   1 year(s)  The format for your next appointment:   In Person  Provider:   Armanda Magic, MD {

## 2024-01-05 NOTE — Addendum Note (Signed)
Addended by: Neita Goodnight B on: 01/05/2024 10:56 AM   Modules accepted: Orders

## 2024-01-05 NOTE — Addendum Note (Signed)
Addended by: Neita Goodnight B on: 01/05/2024 11:12 AM   Modules accepted: Orders

## 2024-01-05 NOTE — Progress Notes (Signed)
Cardiology Office Note:    Date:  01/05/2024   ID:  Gari Crown, DOB 1936/08/13, MRN [id number]  PCP:  Kristian Covey, MD  Cardiologist:  Armanda Magic, MD   Electrophysiologist:  None   Referring MD: Kristian Covey, MD   Chief Complaint:  Atrial Fibrillation and Hypertension    Patient Profile:    Kristen Cruz is a 87 y.o. female with:  Paroxysmal atrial fibrillation  CHA2DS2-VASc=4 (female, age x 2, >> Apixaban) Hypertension  Coronary Ca noted on CT (08/2019) Colon CA s/p R colectomy in 01/2019 Fe deficiency anemia  Prior CV studies: Echocardiogram 03/08/14 EF 55-65, no RWMA  History of Present Illness:    Kristen Cruz  is here today for followup and is doing well.  She has had a lot of anxiety after an eye surgery that she had problems from but is doing better. She denies any chest pain or pressure, SOB, DOE, PND, orthopnea, LE edema, dizziness, palpitations or syncope. She is compliant with her meds and is tolerating meds with no SE.    Past Medical History:  Diagnosis Date   Anticoagulant long-term use    eliquis   Arthritis    Colon cancer (HCC)    History of GI bleed 09/22/2018   upper   HOH (hard of hearing)    Hypertension    IDA (iron deficiency anemia)    PAF (paroxysmal atrial fibrillation) [facility]) cardiologist-- dr Sharlyne Cai score is 4 on Apixaban   PONV (postoperative nausea and vomiting)    Rash    "allergic to my on body tempurature , if I have fever I break out in a rash"   Right nephrolithiasis    per CT 09-22-2018   Wears dentures    upper   Wears glasses     Current Medications: Current Meds  Medication Sig   Cyanocobalamin (VITAMIN B-12 PO) Take 1,000 mg by mouth daily.   diltiazem (DILT-XR) 180 MG 24 hr capsule TAKE 1 CAPSULE BY MOUTH EVERY DAY   ELIQUIS 2.5 MG TABS tablet TAKE 1 TABLET BY MOUTH TWICE A DAY   OXERVATE 0.002 % SOLN Apply 1 drop to eye in the morning, at noon, in the evening, and at bedtime. Left  eye   vitamin C (ASCORBIC ACID) 250 MG tablet Take 1 tablet (250 mg total) by mouth daily.     Allergies:   Asa [aspirin], Bentyl [dicyclomine hcl], Sucrets [dyclonine], Sulfa antibiotics, Tetracycline hcl, and Tylenol [acetaminophen]   Social History   Tobacco Use   Smoking status: Never   Smokeless tobacco: Never  Vaping Use   Vaping status: Never Used  Substance Use Topics   Alcohol use: No   Drug use: Never     Family Hx: The patient's family history includes Cancer in her brother and sister; Heart Problems in her mother; Heart attack in her mother; Hypertension in her mother.  ROS   EKGs/Labs/Other Test Reviewed:     EKG Interpretation Date/Time:  Thursday January 05 2024 10:36:22 EST Ventricular Rate:  103 PR Interval:  138 QRS Duration:  78 QT Interval:  324 QTC Calculation: 424 R Axis:   -34  Text Interpretation: Sinus tachycardia Possible Left atrial enlargement Left axis deviation RSR' or QR pattern in V1 suggests right ventricular conduction delay When compared with ECG of 14-Feb-2019 06:46, PREVIOUS ECG IS PRESENT heart rate has increased Confirmed by Armanda Magic ([id number]) on 01/05/2024 10:44:18 AM    Recent Labs: No results found  for requested labs within last 365 days.   Recent Lipid Panel No results found for: "CHOL", "TRIG", "HDL", "CHOLHDL", "LDLCALC", "LDLDIRECT"  Physical Exam:    VS:  BP (!) 158/72   Pulse (!) 107   Ht 5\' 2"  (1.575 m)   Wt 86 lb (39 kg)   SpO2 95%   BMI 15.73 kg/m     Wt Readings from Last 3 Encounters:  01/05/24 86 lb (39 kg)  10/28/23 84 lb 12.8 oz (38.5 kg)  04/27/23 87 lb 6.4 oz (39.6 kg)    GEN: Well nourished, well developed in no acute distress HEENT: Normal NECK: No JVD; No carotid bruits LYMPHATICS: No lymphadenopathy CARDIAC:regular and tachy, no murmurs, rubs, gallops RESPIRATORY:  Clear to auscultation without rales, wheezing or rhonchi  ABDOMEN: Soft, non-tender, non-distended MUSCULOSKELETAL:  No edema;  No deformity  SKIN: Warm and dry NEUROLOGIC:  Alert and oriented x 3 PSYCHIATRIC:  Normal affect  ASSESSMENT & PLAN:    1.  Paroxysmal atrial fibrillation (HCC) -She continues to maintain sinus but HR in the 100's today on exam.  She denies any palpitations -No complaints of significant bleeding on DOAC -Continue prescription drug management with Cardizem XR 180 mg daily and apixaban 2.5 mg twice daily (dosed for age >38 and weight<60 kg) with as needed refills -add Toprol XL 25mg  daily for elevated BP and elevated HR -check CBC and BMET  2. Essential hypertension, benign -BP elevated on exam today -Continue prescription drug management with diltiazem XR 180 mg daily with urine refills -add Toprol XL 25mg  daily  -Pharm D BP and HR check in 1 week   Dispo: followup 1 year  Medication Adjustments/Labs and Tests Ordered: Current medicines are reviewed at length with the patient today.  Concerns regarding medicines are outlined above.  Tests Ordered: Orders Placed This Encounter  Procedures   EKG 12-Lead     Medication Changes: No orders of the defined types were placed in this encounter.   Signed, Armanda Magic, MD  01/05/2024 10:43 AM    [facility] [address] [place], [place], [place]  [address] Phone: [phone]; Fax: [phone]

## 2024-01-06 LAB — BASIC METABOLIC PANEL
BUN/Creatinine Ratio: 25 (ref 12–28)
BUN: 15 mg/dL (ref 8–27)
CO2: 25 mmol/L (ref 20–29)
Calcium: 9.5 mg/dL (ref 8.7–10.3)
Chloride: 100 mmol/L (ref 96–106)
Creatinine, Ser: 0.59 mg/dL (ref 0.57–1.00)
Glucose: 98 mg/dL (ref 70–99)
Potassium: 4.6 mmol/L (ref 3.5–5.2)
Sodium: 141 mmol/L (ref 134–144)
eGFR: 88 mL/min/{1.73_m2} (ref >59)

## 2024-01-06 LAB — CBC
Hematocrit: 38.7 % (ref 34.0–46.6)
Hemoglobin: 12.2 g/dL (ref 11.1–15.9)
MCH: 29.8 pg (ref 26.6–33.0)
MCHC: 31.5 g/dL (ref 31.5–35.7)
MCV: 94 fL (ref 79–97)
Platelets: 288 10*3/uL (ref 150–450)
RBC: 4.1 x10E6/uL (ref 3.77–5.28)
RDW: 13 % (ref 11.7–15.4)
WBC: 7.9 10*3/uL (ref 3.4–10.8)

## 2024-01-17 ENCOUNTER — Telehealth: Payer: Self-pay | Admitting: Cardiology

## 2024-01-17 NOTE — Telephone Encounter (Signed)
Call to patient to set up RN visit, appointment set for 01/30/24 when her dtr is available to drive her.

## 2024-01-17 NOTE — Telephone Encounter (Signed)
Patient called to report her BP and HR: BP 118/74 HR 72

## 2024-01-30 ENCOUNTER — Ambulatory Visit: Payer: Medicare Other

## 2024-06-11 ENCOUNTER — Other Ambulatory Visit: Payer: Self-pay

## 2024-06-11 MED ORDER — DILTIAZEM HCL ER 180 MG PO CP24: 180.0000 mg | ORAL_CAPSULE | Freq: Every day | ORAL | 2 refills | Status: AC

## 2024-07-12 ENCOUNTER — Other Ambulatory Visit: Payer: Self-pay | Admitting: Cardiology

## 2024-07-12 DIAGNOSIS — I480 Paroxysmal atrial fibrillation: Secondary | ICD-10-CM

## 2024-07-12 NOTE — Telephone Encounter (Signed)
 Prescription refill request for Eliquis  received. Indication: afib  Last office visit: [place], 01/05/2024 Scr: 0.59, 01/05/2024 Age: 88 yo  Weight: 39 kg   Refill sent.

## 2024-12-30 ENCOUNTER — Other Ambulatory Visit: Payer: Self-pay | Admitting: Cardiology

## 2025-01-03 ENCOUNTER — Other Ambulatory Visit: Payer: Self-pay | Admitting: Cardiology

## 2025-01-03 DIAGNOSIS — I480 Paroxysmal atrial fibrillation: Secondary | ICD-10-CM

## 2025-02-11 ENCOUNTER — Ambulatory Visit: Admitting: Cardiology
